# Patient Record
Sex: Male | Born: 1950
Health system: Southern US, Community
[De-identification: ages and names within clinical notes are randomized; demographics above are authoritative.]

## PROBLEM LIST (undated history)

## (undated) DIAGNOSIS — F419 Anxiety disorder, unspecified: Secondary | ICD-10-CM

## (undated) DIAGNOSIS — Z9889 Other specified postprocedural states: Secondary | ICD-10-CM

## (undated) DIAGNOSIS — G20A1 Parkinson's disease without dyskinesia, without mention of fluctuations: Secondary | ICD-10-CM

## (undated) DIAGNOSIS — J301 Allergic rhinitis due to pollen: Secondary | ICD-10-CM

## (undated) DIAGNOSIS — G20 Parkinson's disease: Secondary | ICD-10-CM

## (undated) DIAGNOSIS — N200 Calculus of kidney: Secondary | ICD-10-CM

## (undated) DIAGNOSIS — E890 Postprocedural hypothyroidism: Secondary | ICD-10-CM

## (undated) DIAGNOSIS — T7840XA Allergy, unspecified, initial encounter: Secondary | ICD-10-CM

## (undated) DIAGNOSIS — Z87442 Personal history of urinary calculi: Secondary | ICD-10-CM

## (undated) DIAGNOSIS — K219 Gastro-esophageal reflux disease without esophagitis: Secondary | ICD-10-CM

## (undated) DIAGNOSIS — M1990 Unspecified osteoarthritis, unspecified site: Secondary | ICD-10-CM

## (undated) HISTORY — DX: Gastro-esophageal reflux disease without esophagitis: K21.9

## (undated) HISTORY — DX: Calculus of kidney: N20.0

## (undated) HISTORY — DX: Allergic rhinitis due to pollen: J30.1

## (undated) HISTORY — PX: COLONOSCOPY: SHX174

## (undated) HISTORY — DX: Allergy, unspecified, initial encounter: T78.40XA

## (undated) HISTORY — DX: Postprocedural hypothyroidism: E89.0

---

## 2011-04-25 HISTORY — PX: THYROIDECTOMY: SHX17

## 2016-01-12 ENCOUNTER — Encounter: Payer: Self-pay | Admitting: Urology

## 2016-01-12 ENCOUNTER — Ambulatory Visit: Payer: Self-pay | Admitting: Urology

## 2016-01-12 NOTE — Progress Notes (Deleted)
   01/12/2016 1:46 PM   Todd Green [birth year] [id number]  Referring provider: No referring provider defined for this encounter.  No chief complaint on file.   HPI: 65 year old male referred from [facility] for evaluation of elevated PSA. His PSA was 4.1 proximally 1 year ago. Most recently, his repeat PSA was 6.0 on 11/10/15.     PMH: No past medical history on file.  Surgical History: No past surgical history on file.  Home Medications:    Medication List    as of 01/12/2016  1:46 PM   You have not been prescribed any medications.     Allergies: Allergies not on file  Family History: No family history on file.  Social History:  has no tobacco, alcohol, and drug history on file.  ROS:                                        Physical Exam: There were no vitals taken for this visit.  Constitutional:  Alert and oriented, No acute distress. HEENT: [place] AT, moist mucus membranes.  Trachea midline, no masses. Cardiovascular: No clubbing, cyanosis, or edema. Respiratory: Normal respiratory effort, no increased work of breathing. GI: Abdomen is soft, nontender, nondistended, no abdominal masses GU: No CVA tenderness. *** Skin: No rashes, bruises or suspicious lesions. Lymph: No cervical or inguinal adenopathy. Neurologic: Grossly intact, no focal deficits, moving all 4 extremities. Psychiatric: Normal mood and affect.  Laboratory Data: Cr 0.91 on 10/2015  Urinalysis No results found for: COLORURINE, APPEARANCEUR, LABSPEC, PHURINE, GLUCOSEU, HGBUR, BILIRUBINUR, KETONESUR, PROTEINUR, UROBILINOGEN, NITRITE, LEUKOCYTESUR  Pertinent Imaging: ***  Assessment & Plan:    There are no diagnoses linked to this encounter.  No Follow-up on file.  Hollice Espy, MD  [facility] [address] [place], [place] [address] [phone]

## 2016-01-20 DIAGNOSIS — H905 Unspecified sensorineural hearing loss: Secondary | ICD-10-CM | POA: Diagnosis not present

## 2016-04-24 HISTORY — PX: OTHER SURGICAL HISTORY: SHX169

## 2016-04-24 HISTORY — PX: HAND SURGERY: SHX662

## 2017-02-02 DIAGNOSIS — E039 Hypothyroidism, unspecified: Secondary | ICD-10-CM | POA: Insufficient documentation

## 2017-02-02 DIAGNOSIS — K219 Gastro-esophageal reflux disease without esophagitis: Secondary | ICD-10-CM | POA: Insufficient documentation

## 2017-02-03 DIAGNOSIS — M40202 Unspecified kyphosis, cervical region: Secondary | ICD-10-CM | POA: Insufficient documentation

## 2017-07-23 ENCOUNTER — Emergency Department
Admission: EM | Admit: 2017-07-23 | Discharge: 2017-07-24 | Disposition: A | Discharge status: Home / Self Care | Payer: Medicare Other | Attending: Emergency Medicine | Admitting: Emergency Medicine

## 2017-07-23 ENCOUNTER — Other Ambulatory Visit: Payer: Self-pay

## 2017-07-23 ENCOUNTER — Encounter: Payer: Self-pay | Admitting: Radiology

## 2017-07-23 ENCOUNTER — Emergency Department: Payer: Medicare Other

## 2017-07-23 DIAGNOSIS — R1031 Right lower quadrant pain: Secondary | ICD-10-CM | POA: Diagnosis present

## 2017-07-23 DIAGNOSIS — N200 Calculus of kidney: Secondary | ICD-10-CM | POA: Insufficient documentation

## 2017-07-23 LAB — URINALYSIS, COMPLETE (UACMP) WITH MICROSCOPIC
BACTERIA UA: NONE SEEN
Bilirubin Urine: NEGATIVE
Glucose, UA: NEGATIVE mg/dL
Hgb urine dipstick: NEGATIVE
Ketones, ur: NEGATIVE mg/dL
Leukocytes, UA: NEGATIVE
Nitrite: NEGATIVE
PH: 5 (ref 5.0–8.0)
Protein, ur: NEGATIVE mg/dL
SPECIFIC GRAVITY, URINE: 1.02 (ref 1.005–1.030)

## 2017-07-23 LAB — COMPREHENSIVE METABOLIC PANEL
ALBUMIN: 4 g/dL (ref 3.5–5.0)
ALT: 18 U/L (ref 17–63)
AST: 20 U/L (ref 15–41)
Alkaline Phosphatase: 99 U/L (ref 38–126)
Anion gap: 7 (ref 5–15)
BUN: 29 mg/dL — AB (ref 6–20)
CO2: 25 mmol/L (ref 22–32)
CREATININE: 1.1 mg/dL (ref 0.61–1.24)
Calcium: 9 mg/dL (ref 8.9–10.3)
Chloride: 108 mmol/L (ref 101–111)
GFR calc Af Amer: >60 mL/min (ref >60)
GFR calc non Af Amer: >60 mL/min (ref >60)
GLUCOSE: 144 mg/dL — AB (ref 65–99)
Potassium: 3.9 mmol/L (ref 3.5–5.1)
SODIUM: 140 mmol/L (ref 135–145)
Total Bilirubin: 0.7 mg/dL (ref 0.3–1.2)
Total Protein: 8 g/dL (ref 6.5–8.1)

## 2017-07-23 LAB — CBC
HEMATOCRIT: 42.7 % (ref 40.0–52.0)
Hemoglobin: 13.9 g/dL (ref 13.0–18.0)
MCH: 30.2 pg (ref 26.0–34.0)
MCHC: 32.5 g/dL (ref 32.0–36.0)
MCV: 92.9 fL (ref 80.0–100.0)
PLATELETS: 356 10*3/uL (ref 150–440)
RBC: 4.6 MIL/uL (ref 4.40–5.90)
RDW: 15.4 % — AB (ref 11.5–14.5)
WBC: 13.1 10*3/uL — ABNORMAL HIGH (ref 3.8–10.6)

## 2017-07-23 LAB — LIPASE, BLOOD: LIPASE: 27 U/L (ref 11–51)

## 2017-07-23 MED ORDER — IBUPROFEN 400 MG PO TABS
400.0000 mg | ORAL_TABLET | Freq: Once | ORAL | Status: AC | PRN
  Administered 2017-07-23: 400 mg via ORAL
  Filled 2017-07-23: qty 1

## 2017-07-23 MED ORDER — ONDANSETRON 4 MG PO TBDP
4.0000 mg | ORAL_TABLET | Freq: Once | ORAL | Status: AC
  Administered 2017-07-23: 4 mg via ORAL
  Filled 2017-07-23: qty 1

## 2017-07-23 MED ORDER — IBUPROFEN 400 MG PO TABS
400.0000 mg | ORAL_TABLET | ORAL | Status: AC
  Administered 2017-07-23: 400 mg via ORAL
  Filled 2017-07-23: qty 1

## 2017-07-23 MED ORDER — IOPAMIDOL (ISOVUE-300) INJECTION 61%
100.0000 mL | Freq: Once | INTRAVENOUS | Status: AC | PRN
  Administered 2017-07-23: 100 mL via INTRAVENOUS

## 2017-07-23 MED ORDER — ONDANSETRON 4 MG PO TBDP: 4.0000 mg | ORAL_TABLET | Freq: Four times a day (QID) | ORAL | 0 refills | Status: DC | PRN

## 2017-07-23 NOTE — Discharge Instructions (Addendum)
You have been seen in the Emergency Department (ED) today for pain that we believe based on your workup, is caused by kidney stones.  As we have discussed, please drink plenty of fluids.  Please make a follow up appointment with the physician(s) listed elsewhere in this documentation.   We also recommend that you take over-the-counter ibuprofen regularly according to label instructions over the next 5 days.  Take it with meals to minimize stomach discomfort.  Return to the Emergency Department (ED) or call your doctor if you have any worsening pain, fever, painful urination, are unable to urinate, or develop other symptoms that concern you.

## 2017-07-23 NOTE — ED Notes (Signed)
CT called and notified pt has IV

## 2017-07-23 NOTE — ED Triage Notes (Signed)
Pt brought in via ems from home with abd pain and right lower back pain.  Pt states vomiting x2 .  Hx kidney stones.  Pt alert.
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
## 2017-09-26 ENCOUNTER — Ambulatory Visit: Payer: Self-pay | Admitting: Family Medicine

## 2017-09-28 ENCOUNTER — Ambulatory Visit: Payer: Self-pay | Admitting: Family Medicine

## 2017-10-01 ENCOUNTER — Ambulatory Visit: Payer: Self-pay | Admitting: Family Medicine

## 2017-10-02 ENCOUNTER — Emergency Department: Payer: Medicare Other

## 2017-10-02 ENCOUNTER — Encounter: Payer: Self-pay | Admitting: Emergency Medicine

## 2017-10-02 ENCOUNTER — Emergency Department
Admission: EM | Admit: 2017-10-02 | Discharge: 2017-10-02 | Disposition: A | Discharge status: Other Acute Inpatient Hospital | Payer: Medicare Other | Attending: Emergency Medicine | Admitting: Emergency Medicine

## 2017-10-02 ENCOUNTER — Other Ambulatory Visit: Payer: Self-pay

## 2017-10-02 DIAGNOSIS — S299XXA Unspecified injury of thorax, initial encounter: Secondary | ICD-10-CM | POA: Diagnosis not present

## 2017-10-02 DIAGNOSIS — S02401A Maxillary fracture, unspecified, initial encounter for closed fracture: Secondary | ICD-10-CM | POA: Diagnosis not present

## 2017-10-02 DIAGNOSIS — S0219XA Other fracture of base of skull, initial encounter for closed fracture: Secondary | ICD-10-CM

## 2017-10-02 DIAGNOSIS — S199XXA Unspecified injury of neck, initial encounter: Secondary | ICD-10-CM | POA: Diagnosis not present

## 2017-10-02 DIAGNOSIS — M1712 Unilateral primary osteoarthritis, left knee: Secondary | ICD-10-CM | POA: Diagnosis not present

## 2017-10-02 DIAGNOSIS — M5136 Other intervertebral disc degeneration, lumbar region: Secondary | ICD-10-CM | POA: Diagnosis not present

## 2017-10-02 DIAGNOSIS — M542 Cervicalgia: Secondary | ICD-10-CM | POA: Diagnosis not present

## 2017-10-02 DIAGNOSIS — Z882 Allergy status to sulfonamides status: Secondary | ICD-10-CM | POA: Diagnosis not present

## 2017-10-02 DIAGNOSIS — S0240DA Maxillary fracture, left side, initial encounter for closed fracture: Secondary | ICD-10-CM

## 2017-10-02 DIAGNOSIS — Y998 Other external cause status: Secondary | ICD-10-CM | POA: Diagnosis not present

## 2017-10-02 DIAGNOSIS — S8992XA Unspecified injury of left lower leg, initial encounter: Secondary | ICD-10-CM | POA: Diagnosis not present

## 2017-10-02 DIAGNOSIS — Y92481 Parking lot as the place of occurrence of the external cause: Secondary | ICD-10-CM | POA: Diagnosis not present

## 2017-10-02 DIAGNOSIS — G319 Degenerative disease of nervous system, unspecified: Secondary | ICD-10-CM | POA: Diagnosis not present

## 2017-10-02 DIAGNOSIS — Y9389 Activity, other specified: Secondary | ICD-10-CM | POA: Diagnosis not present

## 2017-10-02 DIAGNOSIS — S3993XA Unspecified injury of pelvis, initial encounter: Secondary | ICD-10-CM | POA: Diagnosis not present

## 2017-10-02 DIAGNOSIS — S0282XA Fracture of other specified skull and facial bones, left side, initial encounter for closed fracture: Secondary | ICD-10-CM | POA: Diagnosis not present

## 2017-10-02 DIAGNOSIS — R110 Nausea: Secondary | ICD-10-CM | POA: Diagnosis not present

## 2017-10-02 DIAGNOSIS — W01198A Fall on same level from slipping, tripping and stumbling with subsequent striking against other object, initial encounter: Secondary | ICD-10-CM | POA: Insufficient documentation

## 2017-10-02 DIAGNOSIS — R9431 Abnormal electrocardiogram [ECG] [EKG]: Secondary | ICD-10-CM | POA: Diagnosis not present

## 2017-10-02 DIAGNOSIS — I4510 Unspecified right bundle-branch block: Secondary | ICD-10-CM | POA: Diagnosis not present

## 2017-10-02 DIAGNOSIS — W19XXXA Unspecified fall, initial encounter: Secondary | ICD-10-CM | POA: Diagnosis not present

## 2017-10-02 DIAGNOSIS — S3992XA Unspecified injury of lower back, initial encounter: Secondary | ICD-10-CM | POA: Diagnosis not present

## 2017-10-02 DIAGNOSIS — G9389 Other specified disorders of brain: Secondary | ICD-10-CM | POA: Diagnosis not present

## 2017-10-02 DIAGNOSIS — M5031 Other cervical disc degeneration,  high cervical region: Secondary | ICD-10-CM | POA: Diagnosis not present

## 2017-10-02 DIAGNOSIS — S0990XA Unspecified injury of head, initial encounter: Secondary | ICD-10-CM | POA: Diagnosis not present

## 2017-10-02 DIAGNOSIS — S0181XA Laceration without foreign body of other part of head, initial encounter: Secondary | ICD-10-CM | POA: Insufficient documentation

## 2017-10-02 DIAGNOSIS — R402411 Glasgow coma scale score 13-15, in the field [EMT or ambulance]: Secondary | ICD-10-CM | POA: Diagnosis not present

## 2017-10-02 DIAGNOSIS — S060X9A Concussion with loss of consciousness of unspecified duration, initial encounter: Secondary | ICD-10-CM | POA: Diagnosis not present

## 2017-10-02 DIAGNOSIS — M5134 Other intervertebral disc degeneration, thoracic region: Secondary | ICD-10-CM | POA: Diagnosis not present

## 2017-10-02 DIAGNOSIS — S020XXA Fracture of vault of skull, initial encounter for closed fracture: Secondary | ICD-10-CM | POA: Diagnosis not present

## 2017-10-02 DIAGNOSIS — K219 Gastro-esophageal reflux disease without esophagitis: Secondary | ICD-10-CM | POA: Diagnosis not present

## 2017-10-02 DIAGNOSIS — Z981 Arthrodesis status: Secondary | ICD-10-CM | POA: Diagnosis not present

## 2017-10-02 DIAGNOSIS — S0292XA Unspecified fracture of facial bones, initial encounter for closed fracture: Secondary | ICD-10-CM | POA: Diagnosis not present

## 2017-10-02 DIAGNOSIS — E039 Hypothyroidism, unspecified: Secondary | ICD-10-CM | POA: Diagnosis not present

## 2017-10-02 DIAGNOSIS — S0232XA Fracture of orbital floor, left side, initial encounter for closed fracture: Secondary | ICD-10-CM | POA: Diagnosis not present

## 2017-10-02 DIAGNOSIS — R000 Tachycardia, unspecified: Secondary | ICD-10-CM | POA: Diagnosis not present

## 2017-10-02 DIAGNOSIS — S0993XA Unspecified injury of face, initial encounter: Secondary | ICD-10-CM | POA: Diagnosis not present

## 2017-10-02 DIAGNOSIS — S3991XA Unspecified injury of abdomen, initial encounter: Secondary | ICD-10-CM | POA: Diagnosis not present

## 2017-10-02 HISTORY — DX: Other specified postprocedural states: Z98.890

## 2017-10-02 LAB — BASIC METABOLIC PANEL
Anion gap: 8 (ref 5–15)
BUN: 25 mg/dL — AB (ref 6–20)
CHLORIDE: 108 mmol/L (ref 101–111)
CO2: 23 mmol/L (ref 22–32)
CREATININE: 1.03 mg/dL (ref 0.61–1.24)
Calcium: 9 mg/dL (ref 8.9–10.3)
GFR calc Af Amer: >60 mL/min (ref >60)
GFR calc non Af Amer: >60 mL/min (ref >60)
GLUCOSE: 147 mg/dL — AB (ref 65–99)
POTASSIUM: 3.5 mmol/L (ref 3.5–5.1)
SODIUM: 139 mmol/L (ref 135–145)

## 2017-10-02 LAB — CBC
HEMATOCRIT: 36.4 % — AB (ref 40.0–52.0)
Hemoglobin: 12.7 g/dL — ABNORMAL LOW (ref 13.0–18.0)
MCH: 32.2 pg (ref 26.0–34.0)
MCHC: 34.9 g/dL (ref 32.0–36.0)
MCV: 92.3 fL (ref 80.0–100.0)
PLATELETS: 329 10*3/uL (ref 150–440)
RBC: 3.95 MIL/uL — ABNORMAL LOW (ref 4.40–5.90)
RDW: 14 % (ref 11.5–14.5)
WBC: 9.1 10*3/uL (ref 3.8–10.6)

## 2017-10-02 LAB — TROPONIN I: Troponin I: <0.03 ng/mL (ref <0.03)

## 2017-10-02 MED ORDER — OXYCODONE-ACETAMINOPHEN 5-325 MG PO TABS
1.0000 | ORAL_TABLET | Freq: Once | ORAL | Status: AC
  Administered 2017-10-02: 1 via ORAL
  Filled 2017-10-02: qty 1

## 2017-10-02 MED ORDER — ONDANSETRON HCL 4 MG/2ML IJ SOLN
INTRAMUSCULAR | Status: AC
  Administered 2017-10-02: 4 mg via INTRAVENOUS
  Filled 2017-10-02: qty 2

## 2017-10-02 MED ORDER — ONDANSETRON HCL 4 MG/2ML IJ SOLN
4.0000 mg | Freq: Once | INTRAMUSCULAR | Status: AC
  Administered 2017-10-02: 4 mg via INTRAVENOUS
  Filled 2017-10-02: qty 2

## 2017-10-02 MED ORDER — SODIUM CHLORIDE 0.9 % IV SOLN
3.0000 g | Freq: Once | INTRAVENOUS | Status: AC
  Administered 2017-10-02: 3 g via INTRAVENOUS
  Filled 2017-10-02: qty 3

## 2017-10-02 MED ORDER — METOCLOPRAMIDE HCL 5 MG/ML IJ SOLN
10.0000 mg | Freq: Once | INTRAMUSCULAR | Status: AC
Start: 2017-10-02 — End: 2017-10-02
  Administered 2017-10-02: 10 mg via INTRAVENOUS
  Filled 2017-10-02: qty 2

## 2017-10-02 MED ORDER — ONDANSETRON HCL 4 MG/2ML IJ SOLN
4.0000 mg | Freq: Once | INTRAMUSCULAR | Status: AC
  Administered 2017-10-02: 4 mg via INTRAVENOUS
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
## 2017-10-02 NOTE — ED Notes (Signed)
Pt transported to CT ?

## 2017-10-02 NOTE — ED Notes (Signed)
Daughter said to call her once her dad is being transferred phone number is [phone]

## 2017-10-02 NOTE — ED Notes (Signed)
[facility] not working. Pt signed paper consent for transfer.

## 2017-10-02 NOTE — ED Notes (Signed)
Pt updated and given warm blanket

## 2017-10-03 ENCOUNTER — Ambulatory Visit: Payer: Self-pay | Admitting: Internal Medicine

## 2017-10-03 DIAGNOSIS — S0219XA Other fracture of base of skull, initial encounter for closed fracture: Secondary | ICD-10-CM | POA: Diagnosis not present

## 2017-10-03 DIAGNOSIS — G9389 Other specified disorders of brain: Secondary | ICD-10-CM | POA: Diagnosis not present

## 2017-10-03 DIAGNOSIS — E039 Hypothyroidism, unspecified: Secondary | ICD-10-CM | POA: Diagnosis not present

## 2017-10-03 DIAGNOSIS — Z981 Arthrodesis status: Secondary | ICD-10-CM | POA: Diagnosis not present

## 2017-10-04 DIAGNOSIS — Z981 Arthrodesis status: Secondary | ICD-10-CM | POA: Diagnosis not present

## 2017-10-04 DIAGNOSIS — S0219XA Other fracture of base of skull, initial encounter for closed fracture: Secondary | ICD-10-CM | POA: Diagnosis not present

## 2017-10-04 DIAGNOSIS — G9389 Other specified disorders of brain: Secondary | ICD-10-CM | POA: Diagnosis not present

## 2017-10-04 DIAGNOSIS — E039 Hypothyroidism, unspecified: Secondary | ICD-10-CM | POA: Diagnosis not present

## 2017-10-06 DIAGNOSIS — S069X9D Unspecified intracranial injury with loss of consciousness of unspecified duration, subsequent encounter: Secondary | ICD-10-CM | POA: Diagnosis not present

## 2017-10-06 DIAGNOSIS — S0240DD Maxillary fracture, left side, subsequent encounter for fracture with routine healing: Secondary | ICD-10-CM | POA: Diagnosis not present

## 2017-10-06 DIAGNOSIS — K219 Gastro-esophageal reflux disease without esophagitis: Secondary | ICD-10-CM | POA: Diagnosis not present

## 2017-10-06 DIAGNOSIS — S0219XD Other fracture of base of skull, subsequent encounter for fracture with routine healing: Secondary | ICD-10-CM | POA: Diagnosis not present

## 2017-10-06 DIAGNOSIS — J309 Allergic rhinitis, unspecified: Secondary | ICD-10-CM | POA: Diagnosis not present

## 2017-10-06 DIAGNOSIS — Z87442 Personal history of urinary calculi: Secondary | ICD-10-CM | POA: Diagnosis not present

## 2017-10-06 DIAGNOSIS — K7689 Other specified diseases of liver: Secondary | ICD-10-CM | POA: Diagnosis not present

## 2017-10-06 DIAGNOSIS — S0232XD Fracture of orbital floor, left side, subsequent encounter for fracture with routine healing: Secondary | ICD-10-CM | POA: Diagnosis not present

## 2017-10-06 DIAGNOSIS — Z981 Arthrodesis status: Secondary | ICD-10-CM | POA: Diagnosis not present

## 2017-10-06 DIAGNOSIS — S0081XD Abrasion of other part of head, subsequent encounter: Secondary | ICD-10-CM | POA: Diagnosis not present

## 2017-10-06 DIAGNOSIS — M1712 Unilateral primary osteoarthritis, left knee: Secondary | ICD-10-CM | POA: Diagnosis not present

## 2017-10-06 DIAGNOSIS — M50321 Other cervical disc degeneration at C4-C5 level: Secondary | ICD-10-CM | POA: Diagnosis not present

## 2017-10-06 DIAGNOSIS — K449 Diaphragmatic hernia without obstruction or gangrene: Secondary | ICD-10-CM | POA: Diagnosis not present

## 2017-10-06 DIAGNOSIS — M5136 Other intervertebral disc degeneration, lumbar region: Secondary | ICD-10-CM | POA: Diagnosis not present

## 2017-10-06 DIAGNOSIS — S0083XD Contusion of other part of head, subsequent encounter: Secondary | ICD-10-CM | POA: Diagnosis not present

## 2017-10-06 DIAGNOSIS — W0110XD Fall on same level from slipping, tripping and stumbling with subsequent striking against unspecified object, subsequent encounter: Secondary | ICD-10-CM | POA: Diagnosis not present

## 2017-10-06 DIAGNOSIS — S00212D Abrasion of left eyelid and periocular area, subsequent encounter: Secondary | ICD-10-CM | POA: Diagnosis not present

## 2017-10-06 DIAGNOSIS — Z9181 History of falling: Secondary | ICD-10-CM | POA: Diagnosis not present

## 2017-10-06 DIAGNOSIS — S0181XD Laceration without foreign body of other part of head, subsequent encounter: Secondary | ICD-10-CM | POA: Diagnosis not present

## 2017-10-06 DIAGNOSIS — G319 Degenerative disease of nervous system, unspecified: Secondary | ICD-10-CM | POA: Diagnosis not present

## 2017-10-06 DIAGNOSIS — E890 Postprocedural hypothyroidism: Secondary | ICD-10-CM | POA: Diagnosis not present

## 2017-10-06 DIAGNOSIS — N281 Cyst of kidney, acquired: Secondary | ICD-10-CM | POA: Diagnosis not present

## 2017-10-06 DIAGNOSIS — M40202 Unspecified kyphosis, cervical region: Secondary | ICD-10-CM | POA: Diagnosis not present

## 2017-10-06 DIAGNOSIS — S40212D Abrasion of left shoulder, subsequent encounter: Secondary | ICD-10-CM | POA: Diagnosis not present

## 2017-10-08 DIAGNOSIS — S0232XD Fracture of orbital floor, left side, subsequent encounter for fracture with routine healing: Secondary | ICD-10-CM | POA: Diagnosis not present

## 2017-10-08 DIAGNOSIS — N281 Cyst of kidney, acquired: Secondary | ICD-10-CM | POA: Diagnosis not present

## 2017-10-08 DIAGNOSIS — S0240DD Maxillary fracture, left side, subsequent encounter for fracture with routine healing: Secondary | ICD-10-CM | POA: Diagnosis not present

## 2017-10-08 DIAGNOSIS — S40212D Abrasion of left shoulder, subsequent encounter: Secondary | ICD-10-CM | POA: Diagnosis not present

## 2017-10-08 DIAGNOSIS — S0181XD Laceration without foreign body of other part of head, subsequent encounter: Secondary | ICD-10-CM | POA: Diagnosis not present

## 2017-10-08 DIAGNOSIS — Z87442 Personal history of urinary calculi: Secondary | ICD-10-CM | POA: Diagnosis not present

## 2017-10-08 DIAGNOSIS — K7689 Other specified diseases of liver: Secondary | ICD-10-CM | POA: Diagnosis not present

## 2017-10-08 DIAGNOSIS — E890 Postprocedural hypothyroidism: Secondary | ICD-10-CM | POA: Diagnosis not present

## 2017-10-08 DIAGNOSIS — S0083XD Contusion of other part of head, subsequent encounter: Secondary | ICD-10-CM | POA: Diagnosis not present

## 2017-10-08 DIAGNOSIS — K219 Gastro-esophageal reflux disease without esophagitis: Secondary | ICD-10-CM | POA: Diagnosis not present

## 2017-10-08 DIAGNOSIS — M5136 Other intervertebral disc degeneration, lumbar region: Secondary | ICD-10-CM | POA: Diagnosis not present

## 2017-10-08 DIAGNOSIS — Z9181 History of falling: Secondary | ICD-10-CM | POA: Diagnosis not present

## 2017-10-08 DIAGNOSIS — G319 Degenerative disease of nervous system, unspecified: Secondary | ICD-10-CM | POA: Diagnosis not present

## 2017-10-08 DIAGNOSIS — S0081XD Abrasion of other part of head, subsequent encounter: Secondary | ICD-10-CM | POA: Diagnosis not present

## 2017-10-08 DIAGNOSIS — J309 Allergic rhinitis, unspecified: Secondary | ICD-10-CM | POA: Diagnosis not present

## 2017-10-08 DIAGNOSIS — K449 Diaphragmatic hernia without obstruction or gangrene: Secondary | ICD-10-CM | POA: Diagnosis not present

## 2017-10-08 DIAGNOSIS — W0110XD Fall on same level from slipping, tripping and stumbling with subsequent striking against unspecified object, subsequent encounter: Secondary | ICD-10-CM | POA: Diagnosis not present

## 2017-10-08 DIAGNOSIS — S00212D Abrasion of left eyelid and periocular area, subsequent encounter: Secondary | ICD-10-CM | POA: Diagnosis not present

## 2017-10-08 DIAGNOSIS — Z981 Arthrodesis status: Secondary | ICD-10-CM | POA: Diagnosis not present

## 2017-10-08 DIAGNOSIS — M1712 Unilateral primary osteoarthritis, left knee: Secondary | ICD-10-CM | POA: Diagnosis not present

## 2017-10-08 DIAGNOSIS — S0219XD Other fracture of base of skull, subsequent encounter for fracture with routine healing: Secondary | ICD-10-CM | POA: Diagnosis not present

## 2017-10-08 DIAGNOSIS — M50321 Other cervical disc degeneration at C4-C5 level: Secondary | ICD-10-CM | POA: Diagnosis not present

## 2017-10-08 DIAGNOSIS — M40202 Unspecified kyphosis, cervical region: Secondary | ICD-10-CM | POA: Diagnosis not present

## 2017-10-08 DIAGNOSIS — S069X9D Unspecified intracranial injury with loss of consciousness of unspecified duration, subsequent encounter: Secondary | ICD-10-CM | POA: Diagnosis not present

## 2017-10-10 DIAGNOSIS — M5136 Other intervertebral disc degeneration, lumbar region: Secondary | ICD-10-CM | POA: Diagnosis not present

## 2017-10-10 DIAGNOSIS — S0240DD Maxillary fracture, left side, subsequent encounter for fracture with routine healing: Secondary | ICD-10-CM | POA: Diagnosis not present

## 2017-10-10 DIAGNOSIS — S0083XD Contusion of other part of head, subsequent encounter: Secondary | ICD-10-CM | POA: Diagnosis not present

## 2017-10-10 DIAGNOSIS — Z87442 Personal history of urinary calculi: Secondary | ICD-10-CM | POA: Diagnosis not present

## 2017-10-10 DIAGNOSIS — M1712 Unilateral primary osteoarthritis, left knee: Secondary | ICD-10-CM | POA: Diagnosis not present

## 2017-10-10 DIAGNOSIS — M40202 Unspecified kyphosis, cervical region: Secondary | ICD-10-CM | POA: Diagnosis not present

## 2017-10-10 DIAGNOSIS — G319 Degenerative disease of nervous system, unspecified: Secondary | ICD-10-CM | POA: Diagnosis not present

## 2017-10-10 DIAGNOSIS — S0219XD Other fracture of base of skull, subsequent encounter for fracture with routine healing: Secondary | ICD-10-CM | POA: Diagnosis not present

## 2017-10-10 DIAGNOSIS — S0081XD Abrasion of other part of head, subsequent encounter: Secondary | ICD-10-CM | POA: Diagnosis not present

## 2017-10-10 DIAGNOSIS — J309 Allergic rhinitis, unspecified: Secondary | ICD-10-CM | POA: Diagnosis not present

## 2017-10-10 DIAGNOSIS — M50321 Other cervical disc degeneration at C4-C5 level: Secondary | ICD-10-CM | POA: Diagnosis not present

## 2017-10-10 DIAGNOSIS — K219 Gastro-esophageal reflux disease without esophagitis: Secondary | ICD-10-CM | POA: Diagnosis not present

## 2017-10-10 DIAGNOSIS — N281 Cyst of kidney, acquired: Secondary | ICD-10-CM | POA: Diagnosis not present

## 2017-10-10 DIAGNOSIS — K449 Diaphragmatic hernia without obstruction or gangrene: Secondary | ICD-10-CM | POA: Diagnosis not present

## 2017-10-10 DIAGNOSIS — S069X9D Unspecified intracranial injury with loss of consciousness of unspecified duration, subsequent encounter: Secondary | ICD-10-CM | POA: Diagnosis not present

## 2017-10-10 DIAGNOSIS — S40212D Abrasion of left shoulder, subsequent encounter: Secondary | ICD-10-CM | POA: Diagnosis not present

## 2017-10-10 DIAGNOSIS — W0110XD Fall on same level from slipping, tripping and stumbling with subsequent striking against unspecified object, subsequent encounter: Secondary | ICD-10-CM | POA: Diagnosis not present

## 2017-10-10 DIAGNOSIS — S0232XD Fracture of orbital floor, left side, subsequent encounter for fracture with routine healing: Secondary | ICD-10-CM | POA: Diagnosis not present

## 2017-10-10 DIAGNOSIS — S0181XD Laceration without foreign body of other part of head, subsequent encounter: Secondary | ICD-10-CM | POA: Diagnosis not present

## 2017-10-10 DIAGNOSIS — Z9181 History of falling: Secondary | ICD-10-CM | POA: Diagnosis not present

## 2017-10-10 DIAGNOSIS — K7689 Other specified diseases of liver: Secondary | ICD-10-CM | POA: Diagnosis not present

## 2017-10-10 DIAGNOSIS — Z981 Arthrodesis status: Secondary | ICD-10-CM | POA: Diagnosis not present

## 2017-10-10 DIAGNOSIS — E890 Postprocedural hypothyroidism: Secondary | ICD-10-CM | POA: Diagnosis not present

## 2017-10-10 DIAGNOSIS — S00212D Abrasion of left eyelid and periocular area, subsequent encounter: Secondary | ICD-10-CM | POA: Diagnosis not present

## 2017-10-11 DIAGNOSIS — S069X9D Unspecified intracranial injury with loss of consciousness of unspecified duration, subsequent encounter: Secondary | ICD-10-CM | POA: Diagnosis not present

## 2017-10-11 DIAGNOSIS — Z981 Arthrodesis status: Secondary | ICD-10-CM | POA: Diagnosis not present

## 2017-10-11 DIAGNOSIS — K219 Gastro-esophageal reflux disease without esophagitis: Secondary | ICD-10-CM | POA: Diagnosis not present

## 2017-10-11 DIAGNOSIS — N281 Cyst of kidney, acquired: Secondary | ICD-10-CM | POA: Diagnosis not present

## 2017-10-11 DIAGNOSIS — J309 Allergic rhinitis, unspecified: Secondary | ICD-10-CM | POA: Diagnosis not present

## 2017-10-11 DIAGNOSIS — M1712 Unilateral primary osteoarthritis, left knee: Secondary | ICD-10-CM | POA: Diagnosis not present

## 2017-10-11 DIAGNOSIS — W0110XD Fall on same level from slipping, tripping and stumbling with subsequent striking against unspecified object, subsequent encounter: Secondary | ICD-10-CM | POA: Diagnosis not present

## 2017-10-11 DIAGNOSIS — Z9181 History of falling: Secondary | ICD-10-CM | POA: Diagnosis not present

## 2017-10-11 DIAGNOSIS — S0181XD Laceration without foreign body of other part of head, subsequent encounter: Secondary | ICD-10-CM | POA: Diagnosis not present

## 2017-10-11 DIAGNOSIS — E890 Postprocedural hypothyroidism: Secondary | ICD-10-CM | POA: Diagnosis not present

## 2017-10-11 DIAGNOSIS — Z87442 Personal history of urinary calculi: Secondary | ICD-10-CM | POA: Diagnosis not present

## 2017-10-11 DIAGNOSIS — K449 Diaphragmatic hernia without obstruction or gangrene: Secondary | ICD-10-CM | POA: Diagnosis not present

## 2017-10-11 DIAGNOSIS — M5136 Other intervertebral disc degeneration, lumbar region: Secondary | ICD-10-CM | POA: Diagnosis not present

## 2017-10-11 DIAGNOSIS — S40212D Abrasion of left shoulder, subsequent encounter: Secondary | ICD-10-CM | POA: Diagnosis not present

## 2017-10-11 DIAGNOSIS — K7689 Other specified diseases of liver: Secondary | ICD-10-CM | POA: Diagnosis not present

## 2017-10-11 DIAGNOSIS — S0081XD Abrasion of other part of head, subsequent encounter: Secondary | ICD-10-CM | POA: Diagnosis not present

## 2017-10-11 DIAGNOSIS — S00212D Abrasion of left eyelid and periocular area, subsequent encounter: Secondary | ICD-10-CM | POA: Diagnosis not present

## 2017-10-11 DIAGNOSIS — S0219XD Other fracture of base of skull, subsequent encounter for fracture with routine healing: Secondary | ICD-10-CM | POA: Diagnosis not present

## 2017-10-11 DIAGNOSIS — S0240DD Maxillary fracture, left side, subsequent encounter for fracture with routine healing: Secondary | ICD-10-CM | POA: Diagnosis not present

## 2017-10-11 DIAGNOSIS — G319 Degenerative disease of nervous system, unspecified: Secondary | ICD-10-CM | POA: Diagnosis not present

## 2017-10-11 DIAGNOSIS — S0232XD Fracture of orbital floor, left side, subsequent encounter for fracture with routine healing: Secondary | ICD-10-CM | POA: Diagnosis not present

## 2017-10-11 DIAGNOSIS — S0083XD Contusion of other part of head, subsequent encounter: Secondary | ICD-10-CM | POA: Diagnosis not present

## 2017-10-11 DIAGNOSIS — M50321 Other cervical disc degeneration at C4-C5 level: Secondary | ICD-10-CM | POA: Diagnosis not present

## 2017-10-11 DIAGNOSIS — M40202 Unspecified kyphosis, cervical region: Secondary | ICD-10-CM | POA: Diagnosis not present

## 2017-10-30 DIAGNOSIS — S40212D Abrasion of left shoulder, subsequent encounter: Secondary | ICD-10-CM | POA: Diagnosis not present

## 2017-10-30 DIAGNOSIS — K219 Gastro-esophageal reflux disease without esophagitis: Secondary | ICD-10-CM | POA: Diagnosis not present

## 2017-10-30 DIAGNOSIS — N281 Cyst of kidney, acquired: Secondary | ICD-10-CM | POA: Diagnosis not present

## 2017-10-30 DIAGNOSIS — S0081XD Abrasion of other part of head, subsequent encounter: Secondary | ICD-10-CM | POA: Diagnosis not present

## 2017-10-30 DIAGNOSIS — S0083XD Contusion of other part of head, subsequent encounter: Secondary | ICD-10-CM | POA: Diagnosis not present

## 2017-10-30 DIAGNOSIS — M40202 Unspecified kyphosis, cervical region: Secondary | ICD-10-CM | POA: Diagnosis not present

## 2017-10-30 DIAGNOSIS — E890 Postprocedural hypothyroidism: Secondary | ICD-10-CM | POA: Diagnosis not present

## 2017-10-30 DIAGNOSIS — Z981 Arthrodesis status: Secondary | ICD-10-CM | POA: Diagnosis not present

## 2017-10-30 DIAGNOSIS — Z9181 History of falling: Secondary | ICD-10-CM | POA: Diagnosis not present

## 2017-10-30 DIAGNOSIS — K449 Diaphragmatic hernia without obstruction or gangrene: Secondary | ICD-10-CM | POA: Diagnosis not present

## 2017-10-30 DIAGNOSIS — J309 Allergic rhinitis, unspecified: Secondary | ICD-10-CM | POA: Diagnosis not present

## 2017-10-30 DIAGNOSIS — S0219XD Other fracture of base of skull, subsequent encounter for fracture with routine healing: Secondary | ICD-10-CM | POA: Diagnosis not present

## 2017-10-30 DIAGNOSIS — S069X9D Unspecified intracranial injury with loss of consciousness of unspecified duration, subsequent encounter: Secondary | ICD-10-CM | POA: Diagnosis not present

## 2017-10-30 DIAGNOSIS — S00212D Abrasion of left eyelid and periocular area, subsequent encounter: Secondary | ICD-10-CM | POA: Diagnosis not present

## 2017-10-30 DIAGNOSIS — S0232XD Fracture of orbital floor, left side, subsequent encounter for fracture with routine healing: Secondary | ICD-10-CM | POA: Diagnosis not present

## 2017-10-30 DIAGNOSIS — M1712 Unilateral primary osteoarthritis, left knee: Secondary | ICD-10-CM | POA: Diagnosis not present

## 2017-10-30 DIAGNOSIS — G319 Degenerative disease of nervous system, unspecified: Secondary | ICD-10-CM | POA: Diagnosis not present

## 2017-10-30 DIAGNOSIS — M50321 Other cervical disc degeneration at C4-C5 level: Secondary | ICD-10-CM | POA: Diagnosis not present

## 2017-10-30 DIAGNOSIS — W0110XD Fall on same level from slipping, tripping and stumbling with subsequent striking against unspecified object, subsequent encounter: Secondary | ICD-10-CM | POA: Diagnosis not present

## 2017-10-30 DIAGNOSIS — S0181XD Laceration without foreign body of other part of head, subsequent encounter: Secondary | ICD-10-CM | POA: Diagnosis not present

## 2017-10-30 DIAGNOSIS — K7689 Other specified diseases of liver: Secondary | ICD-10-CM | POA: Diagnosis not present

## 2017-10-30 DIAGNOSIS — M5136 Other intervertebral disc degeneration, lumbar region: Secondary | ICD-10-CM | POA: Diagnosis not present

## 2017-10-30 DIAGNOSIS — S0240DD Maxillary fracture, left side, subsequent encounter for fracture with routine healing: Secondary | ICD-10-CM | POA: Diagnosis not present

## 2017-10-30 DIAGNOSIS — Z87442 Personal history of urinary calculi: Secondary | ICD-10-CM | POA: Diagnosis not present

## 2017-10-31 ENCOUNTER — Encounter: Payer: Self-pay | Admitting: Internal Medicine

## 2017-10-31 ENCOUNTER — Ambulatory Visit (INDEPENDENT_AMBULATORY_CARE_PROVIDER_SITE_OTHER): Payer: Medicare Other | Admitting: Nurse Practitioner

## 2017-10-31 ENCOUNTER — Encounter (INDEPENDENT_AMBULATORY_CARE_PROVIDER_SITE_OTHER): Payer: Self-pay

## 2017-10-31 VITALS — BP 138/82 | HR 97 | Resp 16 | Ht 69.0 in | Wt 178.2 lb

## 2017-10-31 DIAGNOSIS — G250 Essential tremor: Secondary | ICD-10-CM | POA: Diagnosis not present

## 2017-10-31 DIAGNOSIS — M79642 Pain in left hand: Secondary | ICD-10-CM

## 2017-10-31 DIAGNOSIS — E890 Postprocedural hypothyroidism: Secondary | ICD-10-CM
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
## 2017-11-05 ENCOUNTER — Telehealth: Payer: Self-pay
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
## 2017-11-05 NOTE — Telephone Encounter (Signed)
Pt was notified.  

## 2017-11-12 ENCOUNTER — Telehealth: Payer: Self-pay

## 2017-11-12 NOTE — Telephone Encounter (Signed)
Patient advised that his paperwork is ready for pickup. Tat  Patient requested that I mail them to [employer] [address] [place], [place]

## 2017-11-14 ENCOUNTER — Telehealth: Payer: Self-pay

## 2017-11-14 NOTE — Telephone Encounter (Signed)
PT CALLED TO ASK WHEN HE IS ABLE TO DRIVE AGAIN. SPOKE [place] AND SHE TOLD ME TO ADVISE HIM THAT HE WAIT UNTIL HE COMES TO HIS NEXT APPT SO WE ARE PROPERLY ABLE TO DETERMINE HOW TO GO ABOUT THIS SITUATION.  PT WAS ADVISED.

## 2017-11-22 ENCOUNTER — Other Ambulatory Visit: Payer: Self-pay

## 2017-11-22 MED ORDER — OMEPRAZOLE 40 MG PO CPDR
DELAYED_RELEASE_CAPSULE | ORAL | 4 refills | Status: DC
Start: 2017-11-22 — End: 2018-03-11

## 2017-12-17 DIAGNOSIS — R6889 Other general symptoms and signs: Secondary | ICD-10-CM | POA: Diagnosis not present

## 2017-12-27 DIAGNOSIS — R6889 Other general symptoms and signs: Secondary | ICD-10-CM | POA: Diagnosis not present

## 2017-12-27 DIAGNOSIS — S62015K Nondisplaced fracture of distal pole of navicular [scaphoid] bone of left wrist, subsequent encounter for fracture with nonunion: Secondary | ICD-10-CM | POA: Diagnosis not present

## 2018-01-11 ENCOUNTER — Telehealth: Payer: Self-pay

## 2018-01-11 ENCOUNTER — Other Ambulatory Visit: Payer: Self-pay

## 2018-01-11 ENCOUNTER — Other Ambulatory Visit: Payer: Self-pay | Admitting: Nurse Practitioner

## 2018-01-11 DIAGNOSIS — F17200 Nicotine dependence, unspecified, uncomplicated: Secondary | ICD-10-CM

## 2018-01-11 MED ORDER — BUPROPION HCL ER (SR) 100 MG PO TB12: 100.0000 mg | ORAL_TABLET | Freq: Every day | ORAL | 3 refills | Status: DC

## 2018-01-11 MED ORDER — NICOTINE 21 MG/24HR TD PT24: 21.0000 mg | MEDICATED_PATCH | Freq: Every day | TRANSDERMAL | 2 refills | Status: DC

## 2018-01-11 NOTE — Progress Notes (Signed)
Continue nicoderm CQ, initially started in hospital. Sent 21mg  patch to [facility] [address].

## 2018-01-11 NOTE — Telephone Encounter (Signed)
D/c nicotine patch due to expense. Start wellbutrin SR 100mg  daily. New rx sent to [facility] [address]

## 2018-01-11 NOTE — Progress Notes (Signed)
D/c nicotine patch due to expense. Start wellbutrin SR 100mg  daily. New rx sent to [facility] [address]

## 2018-01-11 NOTE — Telephone Encounter (Signed)
Pt advised we send wellbutrin to [facility]

## 2018-01-11 NOTE — Telephone Encounter (Signed)
Continue nicoderm CQ, initially started in hospital. Sent 21mg  patch to [facility] [address].

## 2018-01-11 NOTE — Telephone Encounter (Signed)
Pt advised we send nicotine patch

## 2018-02-25 ENCOUNTER — Other Ambulatory Visit: Payer: Self-pay

## 2018-02-25 ENCOUNTER — Ambulatory Visit (INDEPENDENT_AMBULATORY_CARE_PROVIDER_SITE_OTHER): Payer: Medicare Other | Admitting: Nurse Practitioner

## 2018-02-25 ENCOUNTER — Encounter: Payer: Self-pay | Admitting: Nurse Practitioner

## 2018-02-25 VITALS — BP 123/70 | HR 92 | Temp 98.7°F | Ht 69.0 in | Wt 178.8 lb

## 2018-02-25 DIAGNOSIS — Z7689 Persons encountering health services in other specified circumstances: Secondary | ICD-10-CM

## 2018-02-25 DIAGNOSIS — Z131 Encounter for screening for diabetes mellitus: Secondary | ICD-10-CM

## 2018-02-25 DIAGNOSIS — K219 Gastro-esophageal reflux disease without esophagitis: Secondary | ICD-10-CM

## 2018-02-25 DIAGNOSIS — E890 Postprocedural hypothyroidism: Secondary | ICD-10-CM | POA: Diagnosis not present

## 2018-02-25 DIAGNOSIS — R350 Frequency of micturition: Secondary | ICD-10-CM

## 2018-02-25 DIAGNOSIS — K449 Diaphragmatic hernia without obstruction or gangrene: Secondary | ICD-10-CM
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
## 2018-02-25 NOTE — Patient Instructions (Addendum)
Todd Green,   Thank you for coming in to clinic today.  1. Continue your hand followup with orthopedics  2. Labs at [facility] this week.  These are non-fasting  3. Continue all current medication without changes.  Please schedule a follow-up appointment with Cassell Smiles, AGNP. Return in about 6 months (around 08/26/2018) for surgical hypothyroidism.  If you have any other questions or concerns, please feel free to call the clinic or send a message through [facility]. You may also schedule an earlier appointment if necessary.  You will receive a survey after today's visit either digitally by e-mail or paper by [employer]. Your experiences and feedback matter to [place].  Please respond so we know how we are doing as we provide care for you.   Cassell Smiles, DNP, AGNP-BC Adult Gerontology Nurse Practitioner [place]

## 2018-02-28 ENCOUNTER — Telehealth: Payer: Self-pay | Admitting: Nurse Practitioner

## 2018-02-28 ENCOUNTER — Encounter: Payer: Self-pay | Admitting: Nurse Practitioner

## 2018-02-28 DIAGNOSIS — K449 Diaphragmatic hernia without obstruction or gangrene: Secondary | ICD-10-CM

## 2018-02-28 DIAGNOSIS — K219 Gastro-esophageal reflux disease without esophagitis: Secondary | ICD-10-CM | POA: Insufficient documentation

## 2018-02-28 LAB — CBC WITH DIFFERENTIAL/PLATELET
Basophils Absolute: 0 10*3/uL (ref 0.0–0.2)
Basos: 0 %
EOS (ABSOLUTE): 0.2 10*3/uL (ref 0.0–0.4)
Eos: 3 %
Hematocrit: 41.1 % (ref 37.5–51.0)
Hemoglobin: 13.9 g/dL (ref 13.0–17.7)
Immature Grans (Abs): 0 10*3/uL (ref 0.0–0.1)
Immature Granulocytes: 0 %
Lymphocytes Absolute: 1 10*3/uL (ref 0.7–3.1)
Lymphs: 15 %
MCH: 29.6 pg (ref 26.6–33.0)
MCHC: 33.8 g/dL (ref 31.5–35.7)
MCV: 87 fL (ref 79–97)
Monocytes Absolute: 0.7 10*3/uL (ref 0.1–0.9)
Monocytes: 10 %
Neutrophils Absolute: 4.7 10*3/uL (ref 1.4–7.0)
Neutrophils: 72 %
Platelets: 329 10*3/uL (ref 150–450)
RBC: 4.7 x10E6/uL (ref 4.14–5.80)
RDW: 12.7 % (ref 12.3–15.4)
WBC: 6.5 10*3/uL (ref 3.4–10.8)

## 2018-02-28 LAB — HEMOGLOBIN A1C
Est. average glucose Bld gHb Est-mCnc: 123 mg/dL
Hgb A1c MFr Bld: 5.9 % — ABNORMAL HIGH (ref 4.8–5.6)

## 2018-02-28 LAB — TSH: TSH: 2.03 u[IU]/mL (ref 0.450–4.500)

## 2018-02-28 LAB — PSA: Prostate Specific Ag, Serum: 2.6 ng/mL (ref 0.0–4.0)

## 2018-02-28 NOTE — Telephone Encounter (Signed)
Pt called for lab results [phone]

## 2018-02-28 NOTE — Telephone Encounter (Signed)
Pt .called wanting his results. Requesting a call back [phone]

## 2018-02-28 NOTE — Telephone Encounter (Signed)
Pt.notified

## 2018-03-11 ENCOUNTER — Telehealth: Payer: Self-pay | Admitting: Family Medicine

## 2018-03-11 MED ORDER — OMEPRAZOLE 40 MG PO CPDR
DELAYED_RELEASE_CAPSULE | ORAL | 1 refills | Status: DC
Start: 2018-03-11 — End: 2018-03-14

## 2018-03-11 NOTE — Telephone Encounter (Signed)
Pt needs a refill on omeprazole sent to [facility].  He also asked about lab results [phone]

## 2018-03-11 NOTE — Telephone Encounter (Signed)
Rx send

## 2018-03-14 ENCOUNTER — Other Ambulatory Visit: Payer: Self-pay

## 2018-03-14 MED ORDER — OMEPRAZOLE 40 MG PO CPDR
DELAYED_RELEASE_CAPSULE | ORAL | 1 refills | Status: DC
Start: 2018-03-14 — End: 2018-07-15

## 2018-03-19 ENCOUNTER — Ambulatory Visit: Payer: Self-pay | Admitting: Adult Health

## 2018-03-20 ENCOUNTER — Ambulatory Visit: Payer: Self-pay | Admitting: Adult Health

## 2018-07-13 ENCOUNTER — Other Ambulatory Visit: Payer: Self-pay | Admitting: Nurse Practitioner

## 2018-07-13 DIAGNOSIS — K219 Gastro-esophageal reflux disease without esophagitis: Secondary | ICD-10-CM

## 2018-07-13 DIAGNOSIS — K449 Diaphragmatic hernia without obstruction or gangrene: Secondary | ICD-10-CM

## 2018-07-13 DIAGNOSIS — E890 Postprocedural hypothyroidism: Secondary | ICD-10-CM

## 2018-07-31 ENCOUNTER — Other Ambulatory Visit: Payer: Self-pay

## 2018-08-01 ENCOUNTER — Ambulatory Visit (INDEPENDENT_AMBULATORY_CARE_PROVIDER_SITE_OTHER): Payer: Medicare Other | Admitting: Nurse Practitioner

## 2018-08-01 ENCOUNTER — Other Ambulatory Visit: Payer: Self-pay

## 2018-08-01 ENCOUNTER — Encounter: Payer: Self-pay | Admitting: Nurse Practitioner

## 2018-08-01 DIAGNOSIS — J301 Allergic rhinitis due to pollen: Secondary | ICD-10-CM | POA: Diagnosis not present

## 2018-08-01 DIAGNOSIS — K219 Gastro-esophageal reflux disease without esophagitis: Secondary | ICD-10-CM | POA: Diagnosis not present

## 2018-08-01 DIAGNOSIS — E890 Postprocedural hypothyroidism: Secondary | ICD-10-CM | POA: Diagnosis not present

## 2018-08-01 DIAGNOSIS — K449 Diaphragmatic hernia without obstruction or gangrene: Secondary | ICD-10-CM

## 2018-08-01 MED ORDER — LEVOTHYROXINE SODIUM 137 MCG PO TABS: 137.0000 ug | ORAL_TABLET | Freq: Every day | ORAL | 1 refills | Status: DC

## 2018-08-01 MED ORDER — OMEPRAZOLE 40 MG PO CPDR: DELAYED_RELEASE_CAPSULE | ORAL | 1 refills | Status: DC

## 2018-08-01 MED ORDER — FLUTICASONE PROPIONATE 50 MCG/ACT NA SUSP: NASAL | 5 refills | Status: DC
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
## 2018-08-12 ENCOUNTER — Ambulatory Visit (INDEPENDENT_AMBULATORY_CARE_PROVIDER_SITE_OTHER): Payer: Medicare Other | Admitting: Nurse Practitioner

## 2018-08-12 ENCOUNTER — Other Ambulatory Visit: Payer: Self-pay

## 2018-08-12 ENCOUNTER — Encounter: Payer: Self-pay | Admitting: Nurse Practitioner

## 2018-08-12 DIAGNOSIS — F515 Nightmare disorder: Secondary | ICD-10-CM | POA: Diagnosis not present

## 2018-08-12 DIAGNOSIS — F5104 Psychophysiologic insomnia: Secondary | ICD-10-CM | POA: Diagnosis not present

## 2018-08-12 MED ORDER — TRAZODONE HCL 50 MG PO TABS: 25.0000 mg | ORAL_TABLET | Freq: Every day | ORAL | 2 refills | Status: DC
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
## 2018-08-15 ENCOUNTER — Other Ambulatory Visit: Payer: Medicare Other

## 2018-08-15 ENCOUNTER — Other Ambulatory Visit: Payer: Self-pay

## 2018-08-15 DIAGNOSIS — E890 Postprocedural hypothyroidism: Secondary | ICD-10-CM

## 2018-08-15 DIAGNOSIS — K449 Diaphragmatic hernia without obstruction or gangrene: Principal | ICD-10-CM

## 2018-08-15 DIAGNOSIS — K219 Gastro-esophageal reflux disease without esophagitis: Secondary | ICD-10-CM

## 2018-08-15 LAB — CBC WITH DIFFERENTIAL/PLATELET
Absolute Monocytes: 617 cells/uL (ref 200–950)
Basophils Absolute: 32 cells/uL (ref 0–200)
Basophils Relative: 0.5 %
Eosinophils Absolute: 202 cells/uL (ref 15–500)
Eosinophils Relative: 3.2 %
HCT: 41.3 % (ref 38.5–50.0)
Hemoglobin: 14 g/dL (ref 13.2–17.1)
Lymphs Abs: 863 cells/uL (ref 850–3900)
MCH: 30.6 pg (ref 27.0–33.0)
MCHC: 33.9 g/dL (ref 32.0–36.0)
MCV: 90.4 fL (ref 80.0–100.0)
MPV: 9.9 fL (ref 7.5–12.5)
Monocytes Relative: 9.8 %
Neutro Abs: 4586 cells/uL (ref 1500–7800)
Neutrophils Relative %: 72.8 %
Platelets: 304 10*3/uL (ref 140–400)
RBC: 4.57 10*6/uL (ref 4.20–5.80)
RDW: 12.4 % (ref 11.0–15.0)
Total Lymphocyte: 13.7 %
WBC: 6.3 10*3/uL (ref 3.8–10.8)

## 2018-08-20 ENCOUNTER — Telehealth: Payer: Self-pay

## 2018-08-20 DIAGNOSIS — E890 Postprocedural hypothyroidism: Secondary | ICD-10-CM

## 2018-08-20 NOTE — Telephone Encounter (Signed)
Patient wants to find out about lab test result also question for PSA advised that it was not ordered recently one from past year was normal but has question about TSH and CBC ordered recently. Somehow it was ordered differently and Lab has not drawn TSH ? Please suggest I can call patient back with your reply ?

## 2018-08-20 NOTE — Telephone Encounter (Signed)
Patient advised.

## 2018-08-20 NOTE — Telephone Encounter (Signed)
Covering inbox for Todd Green, AGPCNP-BC while she is out of office on maternity leave.  I have not reviewed this patient's lab test results yet as they are incomplete.  His last PSA was 02/2018, not due yet until 02/2019 for another PSA.  I have already placed new orders for TSH and Free T4 - to be recollected here - [facility]. We can review result and contact patient with results.  Apologize for any inconvenience on repeat blood draw, I am not sure where the issue was with this one if lab order error not collected.   Nobie Putnam, [facility] 08/20/2018, 12:51 PM

## 2018-08-21 LAB — TSH: TSH: 1.75 mIU/L (ref 0.40–4.50)

## 2018-08-23 ENCOUNTER — Telehealth: Payer: Self-pay

## 2018-08-23 NOTE — Telephone Encounter (Signed)
Result note is now completed for this patient.  Nobie Putnam, DO [facility] 08/23/2018, 9:14 AM

## 2018-08-23 NOTE — Telephone Encounter (Signed)
The pt called requesting his lab results. Please advise

## 2018-10-04 ENCOUNTER — Telehealth: Payer: Self-pay
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
## 2018-10-07 NOTE — Telephone Encounter (Addendum)
I notified the pt of Dr. Parks Ranger recommendation. 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 He states all he needs is an xray stating that his issues are resolved to present to his attorney. I explained to the patient if it's an xray he needs that all he needs to do is contact his treating physician and ask them to order the xray for outpatient radiology location in [place] since transportation is a issue. He verbalize understanding.

## 2018-10-07 NOTE — Telephone Encounter (Signed)
Attempted to contact the pt, no answer. LMOM to return my call.  

## 2018-11-11 ENCOUNTER — Ambulatory Visit (INDEPENDENT_AMBULATORY_CARE_PROVIDER_SITE_OTHER): Payer: Medicare Other | Admitting: Nurse Practitioner

## 2018-11-11 ENCOUNTER — Encounter: Payer: Self-pay | Admitting: Nurse Practitioner

## 2018-11-11 ENCOUNTER — Other Ambulatory Visit: Payer: Self-pay

## 2018-11-11 DIAGNOSIS — E890 Postprocedural hypothyroidism: Secondary | ICD-10-CM | POA: Diagnosis not present

## 2018-11-11 DIAGNOSIS — R251 Tremor, unspecified: Secondary | ICD-10-CM | POA: Diagnosis not present

## 2018-11-11 MED ORDER — PROPRANOLOL HCL ER 60 MG PO CP24
60.0000 mg | ORAL_CAPSULE | Freq: Every day | ORAL | 0 refills | Status: DC
Start: 2018-11-11 — End: 2018-11-28
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
## 2018-11-12 ENCOUNTER — Encounter: Payer: Self-pay | Admitting: Nurse Practitioner

## 2018-11-22 ENCOUNTER — Other Ambulatory Visit: Payer: Medicare Other

## 2018-11-28 ENCOUNTER — Other Ambulatory Visit: Payer: Self-pay

## 2018-11-28 DIAGNOSIS — R251 Tremor, unspecified: Secondary | ICD-10-CM

## 2018-11-28 LAB — TSH+FREE T4: TSH W/REFLEX TO FT4: 2.59 mIU/L (ref 0.40–4.50)

## 2018-11-28 MED ORDER — PROPRANOLOL HCL ER 60 MG PO CP24: 60.0000 mg | ORAL_CAPSULE | Freq: Every day | ORAL | 1 refills | Status: DC

## 2018-12-04 ENCOUNTER — Other Ambulatory Visit: Payer: Self-pay | Admitting: Nurse Practitioner

## 2018-12-04 NOTE — Telephone Encounter (Signed)
Pt  Called requesting refill on propranolol 60 mg  Called into [facility]

## 2018-12-18 ENCOUNTER — Telehealth: Payer: Self-pay | Admitting: Nurse Practitioner

## 2018-12-18 DIAGNOSIS — M79642 Pain in left hand: Secondary | ICD-10-CM

## 2018-12-18 MED ORDER — DICLOFENAC SODIUM 1 % TD GEL: 2.0000 g | Freq: Four times a day (QID) | TRANSDERMAL | 5 refills | Status: DC

## 2018-12-18 NOTE — Telephone Encounter (Signed)
Diclofenac gel called to [facility] in [place].

## 2018-12-18 NOTE — Telephone Encounter (Signed)
Pt called requesting for something to be called in for his arthritis for his hand. Pt  Call back  # is [phone]

## 2018-12-23 ENCOUNTER — Other Ambulatory Visit: Payer: Self-pay

## 2018-12-23 ENCOUNTER — Ambulatory Visit (INDEPENDENT_AMBULATORY_CARE_PROVIDER_SITE_OTHER): Payer: Medicare Other

## 2018-12-23 DIAGNOSIS — Z23 Encounter for immunization: Secondary | ICD-10-CM | POA: Diagnosis not present

## 2018-12-23 NOTE — Patient Instructions (Signed)

## 2019-01-29 ENCOUNTER — Other Ambulatory Visit: Payer: Self-pay

## 2019-01-29 ENCOUNTER — Ambulatory Visit (INDEPENDENT_AMBULATORY_CARE_PROVIDER_SITE_OTHER): Payer: Medicare Other | Admitting: Nurse Practitioner

## 2019-01-29 ENCOUNTER — Encounter: Payer: Self-pay | Admitting: Nurse Practitioner

## 2019-01-29 VITALS — BP 135/68 | HR 60 | Ht 69.0 in | Wt 177.2 lb

## 2019-01-29 DIAGNOSIS — L309 Dermatitis, unspecified: Secondary | ICD-10-CM | POA: Diagnosis not present

## 2019-01-29 DIAGNOSIS — M5386 Other specified dorsopathies, lumbar region: Secondary | ICD-10-CM

## 2019-01-29 DIAGNOSIS — R350 Frequency of micturition: Secondary | ICD-10-CM | POA: Diagnosis not present

## 2019-01-29 DIAGNOSIS — R7309 Other abnormal glucose: Secondary | ICD-10-CM | POA: Diagnosis not present

## 2019-01-29 LAB — POCT URINALYSIS DIPSTICK
Bilirubin, UA: NEGATIVE
Blood, UA: NEGATIVE
Glucose, UA: NEGATIVE
Ketones, UA: NEGATIVE
Leukocytes, UA: NEGATIVE
Nitrite, UA: NEGATIVE
Protein, UA: NEGATIVE
Spec Grav, UA: 1.015 (ref 1.010–1.025)
Urobilinogen, UA: 0.2 E.U./dL
pH, UA: 7 (ref 5.0–8.0)

## 2019-01-29 MED ORDER — TRIAMCINOLONE ACETONIDE 0.025 % EX OINT: 1.0000 "application " | TOPICAL_OINTMENT | Freq: Two times a day (BID) | CUTANEOUS | 0 refills | Status: DC

## 2019-01-29 NOTE — Patient Instructions (Addendum)
Todd Green,   Thank you for coming in to clinic today.  1. For your rash on your face, use a facial cleanser around your mouth and nose.  This is related to your mask usage and bacteria buildup.  2. Your urinary frequency is possibly related to sugar intake. - labs today  3. For bloating, - Take regular stool softener - take 2-3 days per week up to daily as needed.  4. For your hand: - If you don't improve much with your trainer, consider asking [place] for a referral to physical therapy.  Please schedule a follow-up appointment. Return 1-2 weeks if symptoms worsen or fail to improve.  If you have any other questions or concerns, please feel free to call the clinic or send a message through [place]. You may also schedule an earlier appointment if necessary.  You will receive a survey after today's visit either digitally by e-mail or paper by [employer]. Your experiences and feedback matter to [place].  Please respond so we know how we are doing as we provide care for you.  Cassell Smiles, DNP, AGNP-BC Adult Gerontology Nurse Practitioner [place]

## 2019-01-29 NOTE — Progress Notes (Signed)
Subjective:    Patient ID: Todd Green, male    DOB: 01-23-1951, 68 y.o.   MRN: [id number]  Todd Green is a 68 y.o. male presenting on 01/29/2019 for Urinary Frequency (with some urinary incontinencex2 week), Rash (around the mouth and under the nose possibly from the mask ), and Bloated (with intermittent constipation)  HPI Rash Patient notes onset over last 3-4 days of rash under mask around mouth and nose.  Rash is erythematous, mildly pruritic.  Decreased/slow mobility Left hand had surgery and has difficulty pushing up with the left arm.  Is hard to get out of the truck.  Notes is unable to bear weight on left hand.  Is having a hard time twisting/moving from seated.  Urinary frequency Patient had onset of symptoms of urinary frequency 2 weeks ago.  Patient notes urine is clear yellow.  Concerning to him today is intermittent urinary incontinence. - No urinary pain/bladder pain - Patient notes he is eating a lot of carbs.  Is concerned about possibly due to elevated glucose.  When he eats more sweets/ice cream he has to go to the bathroom a lot. Since stopping the carbs, notes improved symptoms.    Social History   Tobacco Use  . Smoking status: Never Smoker  . Smokeless tobacco: Never Used  Substance Use Topics  . Alcohol use: Not Currently  . Drug use: Not Currently    Review of Systems Per HPI unless specifically indicated above     Objective:    BP 135/68 (BP Location: Right Arm, Patient Position: Sitting, Cuff Size: Normal)   Pulse 60   Ht 5\' 9"  (1.753 m)   Wt 177 lb 3.2 oz (80.4 kg)   BMI 26.17 kg/m   Wt Readings from Last 3 Encounters:  01/29/19 177 lb 3.2 oz (80.4 kg)  02/25/18 178 lb 12.8 oz (81.1 kg)  10/31/17 178 lb 3.2 oz (80.8 kg)    Physical Exam Vitals signs reviewed.  Constitutional:      General: He is not in acute distress.    Appearance: He is well-developed and normal weight.  HENT:     Head: Normocephalic and atraumatic.     Nose:      Comments: Patient with mild erythema around mouth, chin, nose under mask  Cardiovascular:     Rate and Rhythm: Normal rate and regular rhythm.     Pulses: Normal pulses.     Heart sounds: Normal heart sounds.  Pulmonary:     Effort: Pulmonary effort is normal.     Breath sounds: Normal breath sounds.  Abdominal:     General: Abdomen is flat. Bowel sounds are normal. There is no distension.     Tenderness: There is no abdominal tenderness. There is no right CVA tenderness or left CVA tenderness.     Comments: Patient with known prostate enlargement-declines exam today  Musculoskeletal:     Comments: Left hand with severely limited ROM, decreased grip strength.  Normal strength at elbow.  Patient with limited ability to rotate spine at hips, limited neck rotation  Skin:    General: Skin is warm and dry.     Capillary Refill: Capillary refill takes less than 2 seconds.  Neurological:     General: No focal deficit present.     Mental Status: He is alert and oriented to person, place, and time. Mental status is at baseline.  Psychiatric:        Mood and Affect: Mood normal.  Behavior: Behavior normal.        Thought Content: Thought content normal.        Judgment: Judgment normal.    Results for orders placed or performed in visit on 01/29/19  POCT Urinalysis Dipstick  Result Value Ref Range   Color, UA yellow    Clarity, UA clear    Glucose, UA Negative Negative   Bilirubin, UA negative    Ketones, UA negative    Spec Grav, UA 1.015 1.010 - 1.025   Blood, UA negative    pH, UA 7.0 5.0 - 8.0   Protein, UA Negative Negative   Urobilinogen, UA 0.2 0.2 or 1.0 E.U./dL   Nitrite, UA negative    Leukocytes, UA Negative Negative   Appearance     Odor        Assessment & Plan:   Problem List Items Addressed This Visit    None    Visit Diagnoses    Urinary frequency    -  Primary Unknown primary cause.  Dipstick today shows no infection concerns, no elevated  glucose.  Cannot exclude prostate enlargement, hyperglycemia as causes.  IPSS low severity score.  Plan: 1. Labs today - screen DM, kidney function 2. Follow-up prn after labs.   Relevant Orders   POCT Urinalysis Dipstick (Completed)   Hemoglobin A1c (Completed)   Basic Metabolic Panel (BMET) (Completed)   Dermatitis     New finding today, mild dermatitis - likely irritant/contact dermatitis.   - START triamcinolone ointment bid x 7 days.  Limit use to face only to 7 days, repeat if needed. - Follow-up prn   Relevant Medications   triamcinolone (KENALOG) 0.025 % ointment   Abnormal glucose       Decreased ROM of lumbar spine     Patient with limited mobility moving from seated to standing from low chair.  Limited assistance with left hand.    Plan: 1. Offered physical therapy.  Patient declines 2. Encourage ROM exercises to improve strength and flexibility 3. Follow-up prn worsening or if decides to change plan and accept physical therapy.      Meds ordered this encounter  Medications  . triamcinolone (KENALOG) 0.025 % ointment    Sig: Apply 1 application topically 2 (two) times daily.    Dispense:  30 g    Refill:  0    Order Specific Question:   Supervising Provider    Answer:   Olin Hauser [[id number]]    Follow up plan: Return 1-2 weeks if symptoms worsen or fail to improve.  Cassell Smiles, DNP, AGPCNP-BC Adult Gerontology Primary Care Nurse Practitioner [facility] 01/29/2019, 8:38 AM

## 2019-01-30 ENCOUNTER — Telehealth: Payer: Self-pay

## 2019-01-30 LAB — BASIC METABOLIC PANEL
BUN/Creatinine Ratio: 34 (calc) — ABNORMAL HIGH (ref 6–22)
BUN: 28 mg/dL — ABNORMAL HIGH (ref 7–25)
CO2: 26 mmol/L (ref 20–32)
Calcium: 9.4 mg/dL (ref 8.6–10.3)
Chloride: 110 mmol/L (ref 98–110)
Creat: 0.83 mg/dL (ref 0.70–1.25)
Glucose, Bld: 88 mg/dL (ref 65–99)
Potassium: 4.7 mmol/L (ref 3.5–5.3)
Sodium: 142 mmol/L (ref 135–146)

## 2019-01-30 LAB — HEMOGLOBIN A1C
Hgb A1c MFr Bld: 5.6 % of total Hgb (ref <5.7)
Mean Plasma Glucose: 114 (calc)
eAG (mmol/L): 6.3 (calc)

## 2019-01-30 NOTE — Telephone Encounter (Signed)
The pt was notified of his lab results. He verbalize understanding. No questions or concerns.

## 2019-02-03 ENCOUNTER — Ambulatory Visit (INDEPENDENT_AMBULATORY_CARE_PROVIDER_SITE_OTHER): Payer: Medicare Other | Admitting: Nurse Practitioner

## 2019-02-03 ENCOUNTER — Other Ambulatory Visit: Payer: Self-pay

## 2019-02-03 ENCOUNTER — Encounter: Payer: Self-pay | Admitting: Nurse Practitioner

## 2019-02-03 VITALS — BP 122/70 | HR 64 | Ht 69.0 in | Wt 180.0 lb

## 2019-02-03 DIAGNOSIS — S41112A Laceration without foreign body of left upper arm, initial encounter: Secondary | ICD-10-CM

## 2019-02-03 DIAGNOSIS — R351 Nocturia: Secondary | ICD-10-CM | POA: Diagnosis not present

## 2019-02-03 DIAGNOSIS — Z0000 Encounter for general adult medical examination without abnormal findings: Secondary | ICD-10-CM

## 2019-02-03 DIAGNOSIS — Z23 Encounter for immunization: Secondary | ICD-10-CM | POA: Diagnosis not present

## 2019-02-03 NOTE — Patient Instructions (Addendum)
Todd Green,   Thank you for coming in to clinic today.  1. Labs due at next check for Hepatitis C screen and prostate.  2. Continue active lifestyle  3. Eat a healthy diet with lots of vegetables, fruits instead of sweets  Please schedule a follow-up appointment. Return in about 1 year (around 02/03/2020) for annual physical.  If you have any other questions or concerns, please feel free to call the clinic or send a message through [place]. You may also schedule an earlier appointment if necessary.  You will receive a survey after today's visit either digitally by e-mail or paper by [employer]. Your experiences and feedback matter to [place].  Please respond so we know how we are doing as we provide care for you.  Cassell Smiles, DNP, AGNP-BC Adult Gerontology Nurse Practitioner [facility], [facility]   Tdap Vaccine (Tetanus, Diphtheria and Pertussis): What You Need to Know 1. Why get vaccinated? Tetanus, diphtheria and pertussis are very serious diseases. Tdap vaccine can protect [place] from these diseases. And, Tdap vaccine given to pregnant women can protect newborn babies against pertussis.Marland Kitchen TETANUS (Lockjaw) is rare in the [place] today. It causes painful muscle tightening and stiffness, usually all over the body.  It can lead to tightening of muscles in the head and neck so you can't open your mouth, swallow, or sometimes even breathe. Tetanus kills about 1 out of 10 people who are infected even after receiving the best medical care. DIPHTHERIA is also rare in the [place] today. It can cause a thick coating to form in the back of the throat.  It can lead to breathing problems, heart failure, paralysis, and death. PERTUSSIS (Whooping Cough) causes severe coughing spells, which can cause difficulty breathing, vomiting and disturbed sleep.  It can also lead to weight loss, incontinence, and rib fractures. Up to 2 in 100 adolescents and 5 in 100 adults with  pertussis are hospitalized or have complications, which could include pneumonia or death. These diseases are caused by bacteria. Diphtheria and pertussis are spread from person to person through secretions from coughing or sneezing. Tetanus enters the body through cuts, scratches, or wounds. Before vaccines, as many as 200,000 cases of diphtheria, 200,000 cases of pertussis, and hundreds of cases of tetanus, were reported in the [place] each year. Since vaccination began, reports of cases for tetanus and diphtheria have dropped by about 99% and for pertussis by about 80%. 2. Tdap vaccine Tdap vaccine can protect adolescents and adults from tetanus, diphtheria, and pertussis. One dose of Tdap is routinely given at age 13 or 19. People who did not get Tdap at that age should get it as soon as possible. Tdap is especially important for healthcare professionals and anyone having close contact with a baby younger than 12 months. Pregnant women should get a dose of Tdap during every pregnancy, to protect the newborn from pertussis. Infants are most at risk for severe, life-threatening complications from pertussis. Another vaccine, called Td, protects against tetanus and diphtheria, but not pertussis. A Td booster should be given every 10 years. Tdap may be given as one of these boosters if you have never gotten Tdap before. Tdap may also be given after a severe cut or burn to prevent tetanus infection. Your doctor or the person giving you the vaccine can give you more information. Tdap may safely be given at the same time as other vaccines. 3. Some people should not get this vaccine  A person  who has ever had a life-threatening allergic reaction after a previous dose of any diphtheria, tetanus or pertussis containing vaccine, OR has a severe allergy to any part of this vaccine, should not get Tdap vaccine. Tell the person giving the vaccine about any severe allergies.  Anyone who had coma or long  repeated seizures within 7 days after a childhood dose of DTP or DTaP, or a previous dose of Tdap, should not get Tdap, unless a cause other than the vaccine was found. They can still get Td.  Talk to your doctor if you: ? have seizures or another nervous system problem, ? had severe pain or swelling after any vaccine containing diphtheria, tetanus or pertussis, ? ever had a condition called Guillain-Barr Syndrome (GBS), ? aren't feeling well on the day the shot is scheduled. 4. Risks With any medicine, including vaccines, there is a chance of side effects. These are usually mild and go away on their own. Serious reactions are also possible but are rare. Most people who get Tdap vaccine do not have any problems with it. Mild problems following Tdap (Did not interfere with activities)  Pain where the shot was given (about 3 in 4 adolescents or 2 in 3 adults)  Redness or swelling where the shot was given (about 1 person in 5)  Mild fever of at least 100.40F (up to about 1 in 25 adolescents or 1 in 100 adults)  Headache (about 3 or 4 people in 10)  Tiredness (about 1 person in 3 or 4)  Nausea, vomiting, diarrhea, stomach ache (up to 1 in 4 adolescents or 1 in 10 adults)  Chills, sore joints (about 1 person in 10)  Body aches (about 1 person in 3 or 4)  Rash, swollen glands (uncommon) Moderate problems following Tdap (Interfered with activities, but did not require medical attention)  Pain where the shot was given (up to 1 in 5 or 6)  Redness or swelling where the shot was given (up to about 1 in 16 adolescents or 1 in 12 adults)  Fever over 102F (about 1 in 100 adolescents or 1 in 250 adults)  Headache (about 1 in 7 adolescents or 1 in 10 adults)  Nausea, vomiting, diarrhea, stomach ache (up to 1 or 3 people in 100)  Swelling of the entire arm where the shot was given (up to about 1 in 500). Severe problems following Tdap (Unable to perform usual activities; required  medical attention)  Swelling, severe pain, bleeding and redness in the arm where the shot was given (rare). Problems that could happen after any vaccine:  People sometimes faint after a medical procedure, including vaccination. Sitting or lying down for about 15 minutes can help prevent fainting, and injuries caused by a fall. Tell your doctor if you feel dizzy, or have vision changes or ringing in the ears.  Some people get severe pain in the shoulder and have difficulty moving the arm where a shot was given. This happens very rarely.  Any medication can cause a severe allergic reaction. Such reactions from a vaccine are very rare, estimated at fewer than 1 in a million doses, and would happen within a few minutes to a few hours after the vaccination. As with any medicine, there is a very remote chance of a vaccine causing a serious injury or death. The safety of vaccines is always being monitored. For more information, visit: http://www.aguilar.org/ 5. What if there is a serious problem? What should I look for?  Look for  anything that concerns you, such as signs of a severe allergic reaction, very high fever, or unusual behavior. Signs of a severe allergic reaction can include hives, swelling of the face and throat, difficulty breathing, a fast heartbeat, dizziness, and weakness. These would usually start a few minutes to a few hours after the vaccination. What should I do?  If you think it is a severe allergic reaction or other emergency that can't wait, call 9-1-1 or get the person to the nearest hospital. Otherwise, call your doctor.  Afterward, the reaction should be reported to the Vaccine Adverse Event Reporting System (VAERS). Your doctor might file this report, or you can do it yourself through the VAERS web site at www.vaers.SamedayNews.es, or by calling (762) 284-3040. VAERS does not give medical advice. 6. The National Vaccine Injury Compensation Program The [employer] Vaccine Injury  Compensation Program (VICP) is a federal program that was created to compensate people who may have been injured by certain vaccines. Persons who believe they may have been injured by a vaccine can learn about the program and about filing a claim by calling 216 396 3873 or visiting the [place] website at GoldCloset.com.ee. There is a time limit to file a claim for compensation. 7. How can I learn more?  Ask your doctor. He or she can give you the vaccine package insert or suggest other sources of information.  Call your local or state health department.  Contact the Centers for Disease Control and Prevention (CDC): ? Call 743-280-0794 (1-800-CDC-INFO) or ? Visit CDC's website at http://hunter.com/ Vaccine Information Statement Tdap Vaccine (06/17/2013) This information is not intended to replace advice given to you by your health care provider. Make sure you discuss any questions you have with your health care provider. Document Released: 10/10/2011 Document Revised: 11/26/2017 Document Reviewed: 11/26/2017 Elsevier Interactive Patient Education  [employer].
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
## 2019-02-08 ENCOUNTER — Encounter: Payer: Self-pay | Admitting: Nurse Practitioner

## 2019-02-12 ENCOUNTER — Encounter: Payer: Self-pay | Admitting: Nurse Practitioner

## 2019-02-26 ENCOUNTER — Other Ambulatory Visit: Payer: Self-pay | Admitting: Nurse Practitioner

## 2019-02-26 DIAGNOSIS — E890 Postprocedural hypothyroidism: Secondary | ICD-10-CM

## 2019-02-26 DIAGNOSIS — K449 Diaphragmatic hernia without obstruction or gangrene: Secondary | ICD-10-CM

## 2019-02-26 DIAGNOSIS — K219 Gastro-esophageal reflux disease without esophagitis: Secondary | ICD-10-CM

## 2019-03-18 ENCOUNTER — Other Ambulatory Visit: Payer: Self-pay

## 2019-03-18 ENCOUNTER — Ambulatory Visit (INDEPENDENT_AMBULATORY_CARE_PROVIDER_SITE_OTHER): Payer: Medicare Other | Admitting: Family Medicine

## 2019-03-18 ENCOUNTER — Encounter: Payer: Self-pay | Admitting: Family Medicine

## 2019-03-18 VITALS — BP 129/71 | HR 75 | Temp 97.5°F | Ht 69.0 in | Wt 179.2 lb

## 2019-03-18 DIAGNOSIS — R350 Frequency of micturition: Secondary | ICD-10-CM

## 2019-03-18 DIAGNOSIS — S199XXA Unspecified injury of neck, initial encounter: Secondary | ICD-10-CM

## 2019-03-18 DIAGNOSIS — N401 Enlarged prostate with lower urinary tract symptoms: Secondary | ICD-10-CM | POA: Diagnosis not present

## 2019-03-18 DIAGNOSIS — Z8781 Personal history of (healed) traumatic fracture: Secondary | ICD-10-CM | POA: Diagnosis not present

## 2019-03-18 DIAGNOSIS — N138 Other obstructive and reflux uropathy: Secondary | ICD-10-CM

## 2019-03-18 DIAGNOSIS — S134XXA Sprain of ligaments of cervical spine, initial encounter: Secondary | ICD-10-CM

## 2019-03-18 DIAGNOSIS — E890 Postprocedural hypothyroidism: Secondary | ICD-10-CM

## 2019-03-18 LAB — POCT URINALYSIS DIPSTICK
Bilirubin, UA: NEGATIVE
Blood, UA: NEGATIVE
Glucose, UA: NEGATIVE
Ketones, UA: NEGATIVE
Leukocytes, UA: NEGATIVE
Nitrite, UA: NEGATIVE
Protein, UA: NEGATIVE
Spec Grav, UA: >=1.03 — AB (ref 1.010–1.025)
Urobilinogen, UA: 0.2 E.U./dL
pH, UA: 5 (ref 5.0–8.0)

## 2019-03-18 MED ORDER — TAMSULOSIN HCL 0.4 MG PO CAPS: 0.4000 mg | ORAL_CAPSULE | Freq: Every day | ORAL | 2 refills | Status: DC

## 2019-03-18 NOTE — Patient Instructions (Addendum)
Thank you for coming to the office today.  Start Tamsulosin Flomax 0.4mg  once daily in morning, stay well hydrated, CAUTION when standing up suddenly to avoid lightheadedness  Handicap placard for up to 5 years.  Follow-up sooner if urinary symptoms not improved  Please schedule a Follow-up Appointment to: Return in about 3 months (around 06/18/2019) for BPH Urinary frequency / meet new provider.  If you have any other questions or concerns, please feel free to call the office or send a message through [place]. You may also schedule an earlier appointment if necessary.  Additionally, you may be receiving a survey about your experience at our office within a few days to 1 week by e-mail or mail. We value your feedback.  Nobie Putnam, DO [place]
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
## 2019-04-14 ENCOUNTER — Other Ambulatory Visit: Payer: Self-pay | Admitting: Nurse Practitioner

## 2019-04-14 DIAGNOSIS — R251 Tremor, unspecified: Secondary | ICD-10-CM

## 2019-05-14 ENCOUNTER — Ambulatory Visit (INDEPENDENT_AMBULATORY_CARE_PROVIDER_SITE_OTHER): Payer: Medicare Other | Admitting: Family Medicine

## 2019-05-14 ENCOUNTER — Other Ambulatory Visit: Payer: Self-pay

## 2019-05-14 ENCOUNTER — Encounter: Payer: Self-pay | Admitting: Family Medicine

## 2019-05-14 VITALS — BP 115/76 | HR 76 | Temp 97.3°F | Resp 16 | Ht 69.0 in | Wt 185.0 lb

## 2019-05-14 DIAGNOSIS — Z1211 Encounter for screening for malignant neoplasm of colon: Secondary | ICD-10-CM | POA: Diagnosis not present

## 2019-05-14 DIAGNOSIS — S0121XA Laceration without foreign body of nose, initial encounter: Secondary | ICD-10-CM | POA: Diagnosis not present

## 2019-05-14 DIAGNOSIS — S0081XA Abrasion of other part of head, initial encounter: Secondary | ICD-10-CM

## 2019-05-14 DIAGNOSIS — R5383 Other fatigue: Secondary | ICD-10-CM | POA: Diagnosis not present

## 2019-05-14 DIAGNOSIS — W19XXXA Unspecified fall, initial encounter: Secondary | ICD-10-CM | POA: Diagnosis not present

## 2019-05-14 NOTE — Progress Notes (Signed)
Subjective:    Patient ID: Todd Green, male    DOB: January 16, 1951, 69 y.o.   MRN: [id number]  Todd Green is a 69 y.o. male presenting on 05/14/2019 for Fall (onset today, he tripped and has couple of abrasions on forehead, nose and some knee pain on right side due to fall )   HPI    Fall Injury / Forehead Abrasion / Nose Laceration Reports new injury earlier today within past 1-2 hours he had fall accidental, tripped on curb and fell down and injured face. He had some abrasion and bleeding forehead and some swelling localized in that area, only some tenderness to touch by his report, he also has a laceration on bridge of nose he put bandaid on and came to office for evaluation. - He uses handicap placard as well to help with mobility when parking in public  No other injury to extremity. He has chronic neck pain and stiffness, history of neck surgery.  Denies any issue with loss of consciousness, confusion, dizziness, nausea vomiting, deformity to nose or unable to breath or significant epistaxis.  Additional concerns  Fatigue, chronic He requests testosterone test to be performed next week due to fatigue, he has concerns about Low T. Has not had prior test that I can see available.  Asking about COVID vaccine for age 42+, he asks how to sign up   Health Maintenance: Colon CA Screening - today patient requests to have Cologuard done. He has history of prior colonoscopy. He has fam history of colorectal cancer.  Depression screen [facility] 2/9 05/14/2019 08/12/2018 10/31/2017  Decreased Interest 0 0 0  Down, Depressed, Hopeless 0 0 0  PHQ - 2 Score 0 0 0  Altered sleeping - 0 -  Tired, decreased energy - 0 -  Change in appetite - 0 -  Feeling bad or failure about yourself  - 0 -  Trouble concentrating - 0 -  Moving slowly or fidgety/restless - 0 -  Suicidal thoughts - 0 -  PHQ-9 Score - 0 -  Difficult doing work/chores - Not difficult at all -    Social History   Tobacco Use    . Smoking status: Never Smoker  . Smokeless tobacco: Never Used  Substance Use Topics  . Alcohol use: Not Currently  . Drug use: Not Currently    Review of Systems Per HPI unless specifically indicated above     Objective:    BP 115/76   Pulse 76   Temp (!) 97.3 F (36.3 C) (Oral)   Resp 16   Ht 5\' 9"  (1.753 m)   Wt 185 lb (83.9 kg)   BMI 27.32 kg/m   Wt Readings from Last 3 Encounters:  05/14/19 185 lb (83.9 kg)  03/18/19 179 lb 3.2 oz (81.3 kg)  02/03/19 180 lb (81.6 kg)    Physical Exam Vitals and nursing note reviewed.  Constitutional:      General: He is not in acute distress.    Appearance: He is well-developed. He is not diaphoretic.     Comments: Well-appearing, comfortable, cooperative  HENT:     Head: Normocephalic.     Comments: Forehead with superficial abrasion central location 3-4 cm approx area slight oozing and abrasion but no active bleeding, no laceration, some localized surrounding edema and mild ecchymosis, minimal or no tenderness on palpation.    Nose:     Comments: Small 1 cm laceration on bridge of nose. No deformity of nose, nares evaluated with patency, no  epistaxis Eyes:     General:        Right eye: No discharge.        Left eye: No discharge.     Conjunctiva/sclera: Conjunctivae normal.  Cardiovascular:     Rate and Rhythm: Normal rate.  Pulmonary:     Effort: Pulmonary effort is normal.  Skin:    General: Skin is warm and dry.     Findings: No erythema or rash.  Neurological:     Mental Status: He is alert and oriented to person, place, and time.  Psychiatric:        Behavior: Behavior normal.     Comments: Well groomed, good eye contact, normal speech and thoughts    Results for orders placed or performed in visit on 03/18/19  POCT Urinalysis Dipstick  Result Value Ref Range   Color, UA yellow    Clarity, UA cloudy    Glucose, UA Negative Negative   Bilirubin, UA negative    Ketones, UA negative    Spec Grav, UA  >=1.030 (A) 1.010 - 1.025   Blood, UA negative    pH, UA 5.0 5.0 - 8.0   Protein, UA Negative Negative   Urobilinogen, UA 0.2 0.2 or 1.0 E.U./dL   Nitrite, UA negative    Leukocytes, UA Negative Negative   Appearance cloudy    Odor        Assessment & Plan:   Problem List Items Addressed This Visit    None    Visit Diagnoses    Laceration of nose, initial encounter    -  Primary   Injury due to fall, initial encounter       Screening for colon cancer       Relevant Orders   Cologuard   Other fatigue       Relevant Orders   Testosterone   Decreased energy       Relevant Orders   Testosterone   Abrasion of forehead, initial encounter          #Fall, Forehead contusion / Nasal laceration Clinically with several superficial injuries identified on exam, no other focal deficit or extremity or joint injury Concern with head injury but not having any complications or confusion or systemic symptoms or other concerns  Used basin of sterile saline and sterile gauze for rinsing the abrasions and laceration forehead and nose, and then used topical antibiotic ointment and bandaid on nose, basic wound care. No suture required.  Reassurance, he can continue to wash wounds with soap and water as they heal, antibiotic ointment PRN, notify us if any worsening or new concerns, spreading redness concern for secondary infection  #Fatigue Check testosterone in AM next week  Additionally Signed him up for COVID19 vaccine waiting list for age 66 patients through [name], he will receive call or email when it is available.  No orders of the defined types were placed in this encounter.    Follow up plan: Return if symptoms worsen or fail to improve.   Nobie Putnam, [facility] 05/14/2019, 1:30 PM

## 2019-05-14 NOTE — Patient Instructions (Addendum)
Thank you for coming to the office today.  Signed up to [place] vaccine - stay tuned for updates via email and they will tell you when and where you can get your first dose COVID19 through [place] basic wound care, can rinse in shower or with warm wash cloth, soapy water  Can use topical antibiotic ointment 1-2 times a day as needed as healing.  If develop redness spreading drainage or fever or worsening, then notify office.  Cologuard will order and send this to you.  TESTOSTERONE test has to be 8am to 9am only.  DUE for FASTING BLOOD WORK (no food or drink after midnight before the lab appointment, only water or coffee without cream/sugar on the morning of)  SCHEDULE "Lab Only" visit in the morning at the clinic for lab draw in 1 week  - Make sure Lab Only appointment is at about 1 week before your next appointment, so that results will be available  For Lab Results, once available within 2-3 days of blood draw, you can can log in to MyChart online to view your results and a brief explanation. Also, we can discuss results at next follow-up visit.    Please schedule a Follow-up Appointment to: Return if symptoms worsen or fail to improve.  If you have any other questions or concerns, please feel free to call the office or send a message through [place]. You may also schedule an earlier appointment if necessary.  Additionally, you may be receiving a survey about your experience at our office within a few days to 1 week by e-mail or mail. We value your feedback.  Nobie Putnam, DO [place]

## 2019-05-20 ENCOUNTER — Other Ambulatory Visit: Payer: Medicare Other

## 2019-05-21 ENCOUNTER — Other Ambulatory Visit: Payer: Self-pay

## 2019-05-21 ENCOUNTER — Other Ambulatory Visit: Payer: Medicare Other

## 2019-05-21 DIAGNOSIS — R7989 Other specified abnormal findings of blood chemistry: Secondary | ICD-10-CM

## 2019-05-21 DIAGNOSIS — R5383 Other fatigue: Secondary | ICD-10-CM

## 2019-05-21 LAB — TESTOSTERONE: Testosterone: 192 ng/dL — ABNORMAL LOW (ref 250–827)

## 2019-05-22 ENCOUNTER — Telehealth: Payer: Self-pay | Admitting: Family Medicine

## 2019-05-22 DIAGNOSIS — R7989 Other specified abnormal findings of blood chemistry: Secondary | ICD-10-CM

## 2019-05-22 NOTE — Telephone Encounter (Signed)
Reason for Referral: Low testosterone 192, with fatigue  Referral discussed with patient: yes  Best contact number of patient for referral team: [phone] (H)  Has patient been seen by a specialist for this issue before: no  If so, who (practice/provider): n/a  Does the patient wish to return: n/a  Patient provider preference for referral: Dr Maryan Puls  Patient location preference for referral: [facility]  Nobie Putnam, [facility] 05/22/2019, 6:38 PM

## 2019-05-23 ENCOUNTER — Telehealth: Payer: Self-pay | Admitting: Family Medicine

## 2019-05-23 NOTE — Telephone Encounter (Signed)
Patient called back. He will keep apt with Dr Ambrose Pancoast urology next week on [place], he said that they told him weekly injection for now, I advised him that likely it will change to longer time between injections he can ask them to clarify that first, and also they can sometimes do self training and injections. Advised that I don't normally start patient on these medications so he should stick with urology. He understands and agrees to go next week to Urology, in the future will have to figure out alternative option if he cannot afford it. He said [facility] told him would take a month or more to get in.  Nobie Putnam, [facility] 05/23/2019, 1:33 PM

## 2019-05-23 NOTE — Telephone Encounter (Signed)
Attempted to call patient at 1:15pm today 05/23/19, did not reach him, left a detailed voicemail, advised again that I would not plan to start testosterone supplemental treatment, and that he may need further testing and evaluation by Urologist. We have already referred him and he was scheduled, so he should proceed with that apt and keep the current plan with Urologist as next step.  Nobie Putnam, DO [facility] 05/23/2019, 1:16 PM

## 2019-05-23 NOTE — Telephone Encounter (Signed)
Patient has appt scheduled with urology and as per his insurance company, he would have to pay $35 co-pay and cheaper option is to get testosterone shot or androgen therapy. Wanted to see if Dr.K can Rx this and can come in the office for getting shots or androgen therapy which supposed to go in his arm. Advised patient that we don't provide testosterone shot administration but urologist is the best place and he can also ask urology to have less frequent office follow up.

## 2019-05-27 DIAGNOSIS — E291 Testicular hypofunction: Secondary | ICD-10-CM | POA: Diagnosis not present

## 2019-05-27 DIAGNOSIS — N5201 Erectile dysfunction due to arterial insufficiency: Secondary | ICD-10-CM | POA: Diagnosis not present

## 2019-05-27 DIAGNOSIS — N401 Enlarged prostate with lower urinary tract symptoms: Secondary | ICD-10-CM | POA: Diagnosis not present

## 2019-05-28 DIAGNOSIS — E291 Testicular hypofunction: Secondary | ICD-10-CM | POA: Diagnosis not present

## 2019-05-28 DIAGNOSIS — N5201 Erectile dysfunction due to arterial insufficiency: Secondary | ICD-10-CM | POA: Diagnosis not present

## 2019-05-28 DIAGNOSIS — N401 Enlarged prostate with lower urinary tract symptoms: Secondary | ICD-10-CM | POA: Diagnosis not present

## 2019-06-11 DIAGNOSIS — Z1212 Encounter for screening for malignant neoplasm of rectum: Secondary | ICD-10-CM | POA: Diagnosis not present

## 2019-06-11 DIAGNOSIS — Z1211 Encounter for screening for malignant neoplasm of colon: Secondary | ICD-10-CM | POA: Diagnosis not present

## 2019-06-15 ENCOUNTER — Other Ambulatory Visit: Payer: Self-pay | Admitting: Family Medicine

## 2019-06-15 DIAGNOSIS — N138 Other obstructive and reflux uropathy: Secondary | ICD-10-CM

## 2019-06-15 DIAGNOSIS — R350 Frequency of micturition: Secondary | ICD-10-CM

## 2019-06-19 LAB — COLOGUARD
COLOGUARD: NEGATIVE
Cologuard: NEGATIVE

## 2019-06-19 LAB — EXTERNAL GENERIC LAB PROCEDURE: COLOGUARD: NEGATIVE

## 2019-06-21 LAB — COLOGUARD: Cologuard: NEGATIVE

## 2019-06-24 ENCOUNTER — Encounter: Payer: Self-pay | Admitting: Family Medicine

## 2019-06-24 ENCOUNTER — Telehealth: Payer: Self-pay | Admitting: Nurse Practitioner

## 2019-06-24 ENCOUNTER — Ambulatory Visit (INDEPENDENT_AMBULATORY_CARE_PROVIDER_SITE_OTHER): Payer: Medicare HMO | Admitting: Family Medicine

## 2019-06-24 ENCOUNTER — Other Ambulatory Visit: Payer: Self-pay

## 2019-06-24 DIAGNOSIS — K5900 Constipation, unspecified: Secondary | ICD-10-CM

## 2019-06-24 MED ORDER — POLYETHYLENE GLYCOL 3350 17 GM/SCOOP PO POWD: 17.0000 g | Freq: Two times a day (BID) | ORAL | 1 refills | Status: DC | PRN

## 2019-06-24 NOTE — Progress Notes (Signed)
Subjective:    Patient ID: Todd Green, male    DOB: [birth year], 69 y.o.   MRN: [id number]  Todd Green is a 69 y.o. male presenting on 06/24/2019 for Bloated (intermittent constipation. Pt concern for a possible hiatal hernia.)   HPI  Todd Green presents to clinic for evaluation of intermittent constipation.  Has a history of hiatal hernia and is concerned this may be contributing to his symptoms.  Has had chronic intermittent constipation without use of any OTC medications.  Denies bleeding, blood on tissue/stool, external hemorrhoids, straining.  Feels bloated from lack of bowel movements.  Depression screen [facility] 2/9 05/14/2019 08/12/2018 10/31/2017  Decreased Interest 0 0 0  Down, Depressed, Hopeless 0 0 0  PHQ - 2 Score 0 0 0  Altered sleeping - 0 -  Tired, decreased energy - 0 -  Change in appetite - 0 -  Feeling bad or failure about yourself  - 0 -  Trouble concentrating - 0 -  Moving slowly or fidgety/restless - 0 -  Suicidal thoughts - 0 -  PHQ-9 Score - 0 -  Difficult doing work/chores - Not difficult at all -    Social History   Tobacco Use  . Smoking status: Never Smoker  . Smokeless tobacco: Never Used  Substance Use Topics  . Alcohol use: Not Currently  . Drug use: Not Currently    Review of Systems  Constitutional: Negative.   Respiratory: Negative.   Cardiovascular: Negative.   Gastrointestinal: Positive for abdominal distention and constipation. Negative for abdominal pain, anal bleeding, blood in stool, diarrhea, nausea, rectal pain and vomiting.  Genitourinary: Negative.   Musculoskeletal: Negative.   Skin: Negative.   Allergic/Immunologic: Negative.   Neurological: Negative.   Hematological: Negative.   Psychiatric/Behavioral: Negative.    Per HPI unless specifically indicated above     Objective:    BP 125/64 (BP Location: Right Arm)   Pulse 67   Temp (!) 97.3 F (36.3 C) (Oral)   Ht 5\' 9"  (1.753 m)   Wt 181 lb 3.2 oz (82.2 kg)   BMI  26.76 kg/m   Wt Readings from Last 3 Encounters:  06/24/19 181 lb 3.2 oz (82.2 kg)  05/14/19 185 lb (83.9 kg)  03/18/19 179 lb 3.2 oz (81.3 kg)    Physical Exam Vitals reviewed.  Constitutional:      General: He is not in acute distress.    Appearance: Normal appearance. He is well-groomed and overweight. He is not ill-appearing or toxic-appearing.  HENT:     Head: Normocephalic.  Eyes:     General: Lids are normal. Vision grossly intact.        Right eye: No discharge.        Left eye: No discharge.     Extraocular Movements: Extraocular movements intact.     Conjunctiva/sclera: Conjunctivae normal.     Pupils: Pupils are equal, round, and reactive to light.  Cardiovascular:     Rate and Rhythm: Normal rate and regular rhythm.     Pulses: Normal pulses.     Heart sounds: Normal heart sounds. No murmur. No friction rub. No gallop.   Pulmonary:     Effort: Pulmonary effort is normal. No respiratory distress.     Breath sounds: Normal breath sounds.  Abdominal:     General: Abdomen is flat. Bowel sounds are normal. There is no distension.     Palpations: Abdomen is soft. There is no hepatomegaly, splenomegaly or mass.  Tenderness: There is no abdominal tenderness. There is no guarding or rebound.     Hernia: No hernia is present. There is no hernia in the umbilical area, ventral area, left inguinal area, right femoral area, left femoral area or right inguinal area.  Musculoskeletal:        General: Normal range of motion.     Cervical back: Normal range of motion.     Right lower leg: No edema.     Left lower leg: No edema.  Skin:    General: Skin is warm and dry.     Capillary Refill: Capillary refill takes less than 2 seconds.  Neurological:     General: No focal deficit present.     Mental Status: He is alert and oriented to person, place, and time.     Cranial Nerves: No cranial nerve deficit.     Sensory: No sensory deficit.     Motor: No weakness.      Coordination: Coordination normal.     Gait: Gait normal.  Psychiatric:        Attention and Perception: Attention and perception normal.        Mood and Affect: Mood and affect normal.        Speech: Speech normal.        Behavior: Behavior normal. Behavior is cooperative.        Thought Content: Thought content normal.        Cognition and Memory: Cognition and memory normal.        Judgment: Judgment normal.     Results for orders placed or performed in visit on 06/24/19  Cologuard  Result Value Ref Range   Cologuard Negative Negative      Assessment & Plan:   Problem List Items Addressed This Visit      Other   Constipation    Intermittent constipation that has been chronic.  No blood on tissue, external hemorrhoid concerns, changes in diet or water intake.  Has not tried anything OTC for this in the past.  Plan: 1) To increase water intake and exercise activity. 2) Can use a squatty potty to help with positioning for bowel movement 3) Can begin using Mirilax 1-2 cap full daily mixed with a warm beverage.  Discussed can take 3-4 days for bowel movement and can use this daily if has improvement in symptoms and keeps his bowel movements regular. 4) To follow up if no relief with Mirilax or any of the over the counter supplements that we discussed in clinic 5) Call with any questions/concerns/needs      Relevant Medications   polyethylene glycol powder (GLYCOLAX/MIRALAX) 17 GM/SCOOP powder      Meds ordered this encounter  Medications  . polyethylene glycol powder (GLYCOLAX/MIRALAX) 17 GM/SCOOP powder    Sig: Take 17 g by mouth 2 (two) times daily as needed.    Dispense:  3350 g    Refill:  1      Follow up plan: Return if symptoms worsen or fail to improve.   Harlin Rain, [place] [facility] Nurse Practitioner [facility] 06/24/2019, 4:01 PM

## 2019-06-24 NOTE — Patient Instructions (Addendum)
Holistic constipation  Sourced from: Lynnda Child, [facility], CNE, CNC, FNLP  [email or web address]   Common Causes of Constipation:   -Food allergies or sensitivities  -Lactose intolerance  -Leaky gut/damaged mucosal lining of the intestines  -Impaired digestion and absorption  -Prescription drug use  -Lack of "good" bacteria in the gut  -Not enough fiber in the diet   -Dehydration  -Not enough exercise  -Circadian rhythm out of sync or lack of sunshine  -Stress or anxiety  -Magnesium deficient  Lifestyle Treatment for Constipation:   -Increase exercise  -Gentle yogic twist (lay on floor; pull knees toward chest; twist knees to right side and turn head to left. . . reverse)  -Yoga squat (garland pose)- supported on chair, block or blanket is fine  -Acupuncture to stimulate and tone digestive function and motility  -Colon massage (following the line of the colon)   -Colon hydrotherapy or enema  -Consider what you might be 'holding on to' or not releasing  -Can use a squatty potty (These can be found on [employer] or by a Producer, television/film/video)  Food Treatment for Constipation:   -Consume more fiber   -Stay hydrated  -Avoid hydrogenated and processed foods  -Consume probiotic foods  -Eliminate dairy  -Add ground flaxseed or chia seeds   -Soak dried figs or prunes overnight; drink the soak water  -Papaya can stimulate bowel activity  -Look to probiotic and prebiotic-rich foods  -Dandelion root tea ([employer] and other grocery stores)  Supplemental Treatment to Consider for Constipation:   -Probiotics -Magnesium  -Digestive Enzymes  -Aloe Vera Juice (2-4 oz)   -Slippery elm or Marshmallow root  -Psyllium husk   -Vitamin C   Constipation  -Increase fluids and may begin using Miralax 1 cap daily, increasing up to 2 cap full daily mixed with warm beverages such as tea or coffee. -Citrucel.  -Try adding prunes / other dried fruit.  Limit dairy.    -Maintain a high fiber diet with plenty of roughage, and 6-8 large glasses of water daily to avoid constipation in the future.  -Timing elimination to occur after meals, or after a hot drink, can also improve the situation long term.  -1 or 2 Fleet enemas may be necessary.   Can begin using a squatty potty to help with positioning for bowel movement.  You will receive a survey after today's visit either digitally by e-mail or paper by [employer]. Your experiences and feedback matter to [place].  Please respond so we know how we are doing as we provide care for you.  Call us with any questions/concerns/needs.  It is my goal to be available to you for your health concerns.  Thanks for choosing me to be a partner in your healthcare needs!  Harlin Rain, FNP-C Family Nurse Practitioner [facility] Phone: [phone]

## 2019-06-24 NOTE — Telephone Encounter (Signed)
Pt returning call for results

## 2019-06-24 NOTE — Telephone Encounter (Signed)
Left detail message. 

## 2019-06-24 NOTE — Assessment & Plan Note (Signed)
Intermittent constipation that has been chronic.  No blood on tissue, external hemorrhoid concerns, changes in diet or water intake.  Has not tried anything OTC for this in the past.  Plan: 1) To increase water intake and exercise activity. 2) Can use a squatty potty to help with positioning for bowel movement 3) Can begin using Mirilax 1-2 cap full daily mixed with a warm beverage.  Discussed can take 3-4 days for bowel movement and can use this daily if has improvement in symptoms and keeps his bowel movements regular. 4) To follow up if no relief with Mirilax or any of the over the counter supplements that we discussed in clinic 5) Call with any questions/concerns/needs

## 2019-06-25 DIAGNOSIS — N5201 Erectile dysfunction due to arterial insufficiency: Secondary | ICD-10-CM | POA: Diagnosis not present

## 2019-06-25 DIAGNOSIS — E291 Testicular hypofunction: Secondary | ICD-10-CM | POA: Diagnosis not present

## 2019-06-27 DIAGNOSIS — E291 Testicular hypofunction: Secondary | ICD-10-CM | POA: Diagnosis not present

## 2019-08-12 ENCOUNTER — Other Ambulatory Visit: Payer: Self-pay

## 2019-08-12 DIAGNOSIS — E890 Postprocedural hypothyroidism: Secondary | ICD-10-CM

## 2019-08-12 DIAGNOSIS — K449 Diaphragmatic hernia without obstruction or gangrene: Secondary | ICD-10-CM

## 2019-08-12 DIAGNOSIS — R251 Tremor, unspecified: Secondary | ICD-10-CM

## 2019-08-12 DIAGNOSIS — K219 Gastro-esophageal reflux disease without esophagitis: Secondary | ICD-10-CM

## 2019-08-12 NOTE — Telephone Encounter (Signed)
I attempted to contact the patient to confirm what is his pharmacy of choice. We received a refill request from [facility] and we have [facility] as his pharmacy of choice. If the patient calls back please confirm his pharmacy of choice for his maintenance medications(Omeprazole, Levothyroxine, and Propanolol).

## 2019-08-13 NOTE — Telephone Encounter (Signed)
Patient states he is using [employer] now. Updated chart.

## 2019-08-14 MED ORDER — PROPRANOLOL HCL ER 60 MG PO CP24: 60.0000 mg | ORAL_CAPSULE | Freq: Every day | ORAL | 0 refills | Status: DC

## 2019-08-14 MED ORDER — LEVOTHYROXINE SODIUM 137 MCG PO TABS: 137.0000 ug | ORAL_TABLET | Freq: Every day | ORAL | 0 refills | Status: DC

## 2019-08-14 MED ORDER — OMEPRAZOLE 40 MG PO CPDR: DELAYED_RELEASE_CAPSULE | ORAL | 0 refills | Status: DC

## 2019-08-19 ENCOUNTER — Telehealth: Payer: Self-pay | Admitting: Family Medicine

## 2019-08-19 DIAGNOSIS — N138 Other obstructive and reflux uropathy: Secondary | ICD-10-CM

## 2019-08-19 DIAGNOSIS — N401 Enlarged prostate with lower urinary tract symptoms: Secondary | ICD-10-CM

## 2019-08-19 DIAGNOSIS — R350 Frequency of micturition: Secondary | ICD-10-CM

## 2019-08-19 NOTE — Telephone Encounter (Signed)
[place], [place] Phone:  [phone]  Fax:  [phone]      Reason for call:  Pharmacy requesting the following tamsulosin (FLOMAX) 0.4 MG CAPS capsule and fluticasone (FLONASE) 50 MCG/ACT nasal spray. Informed pharmacy please allow 48 to 72 hour turn around time.

## 2019-08-21 ENCOUNTER — Encounter: Payer: Self-pay | Admitting: Family Medicine

## 2019-08-21 ENCOUNTER — Telehealth: Payer: Self-pay

## 2019-08-21 ENCOUNTER — Other Ambulatory Visit: Payer: Self-pay

## 2019-08-21 ENCOUNTER — Ambulatory Visit (INDEPENDENT_AMBULATORY_CARE_PROVIDER_SITE_OTHER): Payer: Medicare HMO | Admitting: Family Medicine

## 2019-08-21 VITALS — BP 106/64 | HR 65 | Temp 97.5°F | Ht 69.0 in | Wt 179.0 lb

## 2019-08-21 DIAGNOSIS — R7309 Other abnormal glucose: Secondary | ICD-10-CM | POA: Diagnosis not present

## 2019-08-21 DIAGNOSIS — Z125 Encounter for screening for malignant neoplasm of prostate: Secondary | ICD-10-CM

## 2019-08-21 DIAGNOSIS — M62838 Other muscle spasm: Secondary | ICD-10-CM | POA: Diagnosis not present

## 2019-08-21 DIAGNOSIS — G250 Essential tremor: Secondary | ICD-10-CM | POA: Diagnosis not present

## 2019-08-21 DIAGNOSIS — R7989 Other specified abnormal findings of blood chemistry: Secondary | ICD-10-CM

## 2019-08-21 DIAGNOSIS — E890 Postprocedural hypothyroidism: Secondary | ICD-10-CM | POA: Diagnosis not present

## 2019-08-21 LAB — POCT GLYCOSYLATED HEMOGLOBIN (HGB A1C): Hemoglobin A1C: 5.2 % (ref 4.0–5.6)

## 2019-08-21 NOTE — Assessment & Plan Note (Signed)
Reports he has been having vivid nightmares and difficulty with sleep.  Believes the propanolol is causing these to recently begin happening.  Has been on propanolol since 10/2018 without neurology evaluation.  Will stop propanolol at this time for essential tremor.  Plan: 1. Will follow up in 1 month and refer to neurology for etiology of tremor and treatment plan if needing additional treatment

## 2019-08-21 NOTE — Patient Instructions (Signed)
As we discussed, stop the propanolol and we can reassess your tremor and nightmares to see if there is any changes.  Call us with any questions/concerns/needs  We will plan to see you back in 1 month for follow up  You will receive a survey after today's visit either digitally by e-mail or paper by [place] mail. Your experiences and feedback matter to [place].  Please respond so we know how we are doing as we provide care for you.  Call us with any questions/concerns/needs.  It is my goal to be available to you for your health concerns.  Thanks for choosing me to be a partner in your healthcare needs!  Harlin Rain, FNP-C Family Nurse Practitioner [facility] Phone: [phone]

## 2019-08-21 NOTE — Telephone Encounter (Signed)
I put in the referral to PT for the [facility] in [place].  Thanks

## 2019-08-21 NOTE — Telephone Encounter (Signed)
Copied from [place] [phone]. Topic: Referral - Question >> Aug 21, 2019  2:57 PM Greggory Keen D wrote: Pt was in today and seen [facility] and discussed with her about the stiffness in his neck.  He wants to do PT at [facility] clinic at [facility] but there needs to be some paper work filled out.  He said the office would need to get the paperwork off the computer and send it back to them.  Their fax is [phone].  CB# [phone]

## 2019-08-21 NOTE — Assessment & Plan Note (Signed)
Labs to be drawn today, previously well controlled thyroid labs.  Previous labs taken 08/20/2018 were within normal limits.  Currently taking synthroid 177mcg daily and tolerating it well.   Plan: 1) Labs ordered today 2) Continue levothyroxine 171mcg daily  3) We will see you back in 1 months

## 2019-08-21 NOTE — Progress Notes (Addendum)
Subjective:    Patient ID: Todd Green, male    DOB: 07-03-1950, 69 y.o.   MRN: [id number]  Todd Green is a 69 y.o. male presenting on 08/21/2019 for Medication Reaction (pt concern that the propanlol could be causing nightmares and difficulty staying asleep.) and Hypothyroidism   HPI  Todd Green presents to clinic for concern of medication reaction to propanolol.  Has been on since 10/2018.  Reports he has been having nightmares recently that are vivid and causing him difficulty with sleep.  Requesting to stop propanolol for his tremor.  Has not been seen by neurology for evaluation for etiology of tremor.  Denies any other changes in medications/foods/sleep hygiene routine.  Todd Green is requesting physical therapy for bilateral trapezius tension.  Reports has been this way for many years, has a hard time relaxing his upper shoulders/neck and would like to have structured physical therapy.     Depression screen [facility] 2/9 05/14/2019 08/12/2018 10/31/2017  Decreased Interest 0 0 0  Down, Depressed, Hopeless 0 0 0  PHQ - 2 Score 0 0 0  Altered sleeping - 0 -  Tired, decreased energy - 0 -  Change in appetite - 0 -  Feeling bad or failure about yourself  - 0 -  Trouble concentrating - 0 -  Moving slowly or fidgety/restless - 0 -  Suicidal thoughts - 0 -  PHQ-9 Score - 0 -  Difficult doing work/chores - Not difficult at all -    Social History   Tobacco Use  . Smoking status: Never Smoker  . Smokeless tobacco: Never Used  Substance Use Topics  . Alcohol use: Not Currently  . Drug use: Not Currently    Review of Systems  Constitutional: Negative.   HENT: Negative.   Eyes: Negative.   Respiratory: Negative.   Cardiovascular: Negative.   Gastrointestinal: Negative.   Endocrine: Negative.   Genitourinary: Negative.   Musculoskeletal: Negative.   Skin: Negative.   Allergic/Immunologic: Negative.   Neurological: Positive for tremors. Negative for dizziness, seizures,  syncope, facial asymmetry, speech difficulty, weakness, light-headedness, numbness and headaches.       Left hand/arm tremor  Hematological: Negative.   Psychiatric/Behavioral: Positive for sleep disturbance. Negative for agitation, behavioral problems, confusion, decreased concentration, dysphoric mood, hallucinations, self-injury and suicidal ideas. The patient is not nervous/anxious and is not hyperactive.    Per HPI unless specifically indicated above     Objective:    BP 106/64 (BP Location: Left Arm, Patient Position: Sitting, Cuff Size: Normal)   Pulse 65   Temp (!) 97.5 F (36.4 C) (Temporal)   Ht 5\' 9"  (1.753 m)   Wt 179 lb (81.2 kg)   BMI 26.43 kg/m   Wt Readings from Last 3 Encounters:  08/21/19 179 lb (81.2 kg)  06/24/19 181 lb 3.2 oz (82.2 kg)  05/14/19 185 lb (83.9 kg)    Physical Exam Vitals reviewed.  Constitutional:      General: He is not in acute distress.    Appearance: Normal appearance. He is well-developed, well-groomed and overweight. He is not ill-appearing or toxic-appearing.  HENT:     Head: Normocephalic.  Eyes:     General: Lids are normal. Vision grossly intact.        Right eye: No discharge.        Left eye: No discharge.     Extraocular Movements: Extraocular movements intact.     Conjunctiva/sclera: Conjunctivae normal.     Pupils: Pupils are equal, round,  and reactive to light.  Neck:     Thyroid: No thyroid mass, thyromegaly or thyroid tenderness.  Cardiovascular:     Rate and Rhythm: Normal rate and regular rhythm.     Pulses: Normal pulses.     Heart sounds: Normal heart sounds. No murmur. No friction rub. No gallop.   Pulmonary:     Effort: Pulmonary effort is normal. No respiratory distress.     Breath sounds: Normal breath sounds.  Musculoskeletal:       Arms:     Comments: Bilateral trapezius tightening with muscle spasms as shown in image above  Skin:    General: Skin is warm and dry.     Capillary Refill: Capillary refill  takes less than 2 seconds.  Neurological:     General: No focal deficit present.     Mental Status: He is alert and oriented to person, place, and time.     Cranial Nerves: Cranial nerves are intact. No cranial nerve deficit.     Sensory: Sensation is intact. No sensory deficit.     Motor: Tremor present. No weakness, atrophy, abnormal muscle tone, seizure activity or pronator drift.     Coordination: Coordination is intact. Romberg sign negative. Coordination normal.     Gait: Gait is intact. Gait normal.     Deep Tendon Reflexes: Reflexes are normal and symmetric.     Comments: Tremor noted primarily to left arm/left hand  Psychiatric:        Attention and Perception: Attention and perception normal.        Mood and Affect: Mood and affect normal.        Speech: Speech normal.        Behavior: Behavior normal. Behavior is cooperative.        Thought Content: Thought content normal.        Cognition and Memory: Cognition and memory normal.        Judgment: Judgment normal.     Results for orders placed or performed in visit on 08/21/19  POCT glycosylated hemoglobin (Hb A1C)  Result Value Ref Range   Hemoglobin A1C 5.2 4.0 - 5.6 %   HbA1c POC (<> result, manual entry)     HbA1c, POC (prediabetic range)     HbA1c, POC (controlled diabetic range)        Assessment & Plan:   Problem List Items Addressed This Visit      Endocrine   Acquired hypothyroidism    Labs to be drawn today, previously well controlled thyroid labs.  Previous labs taken 08/20/2018 were within normal limits.  Currently taking synthroid 153mcg daily and tolerating it well.   Plan: 1) Labs ordered today 2) Continue levothyroxine 165mcg daily  3) We will see you back in 1 months         Nervous and Auditory   Benign essential tremor    Reports he has been having vivid nightmares and difficulty with sleep.  Believes the propanolol is causing these to recently begin happening.  Has been on propanolol  since 10/2018 without neurology evaluation.  Will stop propanolol at this time for essential tremor.  Plan: 1. Will follow up in 1 month and refer to neurology for etiology of tremor and treatment plan if needing additional treatment       Other Visit Diagnoses    Abnormal glucose    -  Primary   Relevant Orders   POCT glycosylated hemoglobin (Hb A1C) (Completed)   CBC with Differential  COMPLETE METABOLIC PANEL WITH GFR   Screening for prostate cancer       Relevant Orders   PSA   Low testosterone       Relevant Orders   Testosterone      No orders of the defined types were placed in this encounter.     Follow up plan: Return in about 4 weeks (around 09/18/2019) for F/U meds.   Harlin Rain, [place] Family Nurse Practitioner [facility] 08/21/2019, 11:26 AM

## 2019-08-21 NOTE — Addendum Note (Signed)
Addended by: Verl Bangs on: 08/21/2019 05:00 PM   Modules accepted: Orders

## 2019-08-21 NOTE — Addendum Note (Signed)
Addended by: Verl Bangs on: 08/21/2019 03:12 PM   Modules accepted: Orders

## 2019-08-22 ENCOUNTER — Telehealth: Payer: Self-pay | Admitting: *Deleted

## 2019-08-22 ENCOUNTER — Other Ambulatory Visit: Payer: Self-pay | Admitting: Family Medicine

## 2019-08-22 DIAGNOSIS — E039 Hypothyroidism, unspecified: Secondary | ICD-10-CM

## 2019-08-22 DIAGNOSIS — Z6826 Body mass index (BMI) 26.0-26.9, adult: Secondary | ICD-10-CM

## 2019-08-22 LAB — CBC WITH DIFFERENTIAL/PLATELET
Absolute Monocytes: 688 cells/uL (ref 200–950)
Basophils Absolute: 19 cells/uL (ref 0–200)
Basophils Relative: 0.3 %
Eosinophils Absolute: 229 cells/uL (ref 15–500)
Eosinophils Relative: 3.7 %
HCT: 43.8 % (ref 38.5–50.0)
Hemoglobin: 14 g/dL (ref 13.2–17.1)
Lymphs Abs: 936 cells/uL (ref 850–3900)
MCH: 30.6 pg (ref 27.0–33.0)
MCHC: 32 g/dL (ref 32.0–36.0)
MCV: 95.8 fL (ref 80.0–100.0)
MPV: 9.6 fL (ref 7.5–12.5)
Monocytes Relative: 11.1 %
Neutro Abs: 4328 cells/uL (ref 1500–7800)
Neutrophils Relative %: 69.8 %
Platelets: 301 10*3/uL (ref 140–400)
RBC: 4.57 10*6/uL (ref 4.20–5.80)
RDW: 12.2 % (ref 11.0–15.0)
Total Lymphocyte: 15.1 %
WBC: 6.2 10*3/uL (ref 3.8–10.8)

## 2019-08-22 LAB — COMPLETE METABOLIC PANEL WITH GFR
AG Ratio: 1.6 (calc) (ref 1.0–2.5)
ALT: 12 U/L (ref 9–46)
AST: 12 U/L (ref 10–35)
Albumin: 3.8 g/dL (ref 3.6–5.1)
Alkaline phosphatase (APISO): 82 U/L (ref 35–144)
BUN: 24 mg/dL (ref 7–25)
CO2: 25 mmol/L (ref 20–32)
Calcium: 8.9 mg/dL (ref 8.6–10.3)
Chloride: 110 mmol/L (ref 98–110)
Creat: 1.07 mg/dL (ref 0.70–1.18)
GFR, Est African American: 81 mL/min/{1.73_m2} (ref >=60)
GFR, Est Non African American: 70 mL/min/{1.73_m2} (ref >=60)
Globulin: 2.4 g/dL (calc) (ref 1.9–3.7)
Glucose, Bld: 88 mg/dL (ref 65–99)
Potassium: 4.4 mmol/L (ref 3.5–5.3)
Sodium: 141 mmol/L (ref 135–146)
Total Bilirubin: 0.4 mg/dL (ref 0.2–1.2)
Total Protein: 6.2 g/dL (ref 6.1–8.1)

## 2019-08-22 LAB — THYROID PANEL WITH TSH
Free Thyroxine Index: 4.2 — ABNORMAL HIGH (ref 1.4–3.8)
T3 Uptake: 36 % — ABNORMAL HIGH (ref 22–35)
T4, Total: 11.6 ug/dL — ABNORMAL HIGH (ref 4.9–10.5)
TSH: 1.5 mIU/L (ref 0.40–4.50)

## 2019-08-22 LAB — PSA: PSA: 2.8 ng/mL (ref <=4.0)

## 2019-08-22 LAB — TESTOSTERONE: Testosterone: 774 ng/dL (ref 250–827)

## 2019-08-22 NOTE — Telephone Encounter (Signed)
Pt given result per [facility], "Labs are within normal limits. His TSH was normal but other thyroid labs were slightly elevated. We can plan to repeat in 2 weeks and have his fasting lipids done at the same time. I'll put orders in now."; he verbalized understanding; pt also scheduled lab appt 09/05/19 at 0800; he gain verbalized understanding; will route to office for notification,

## 2019-08-22 NOTE — Telephone Encounter (Signed)
I called and left a detail message stating that the referral was placed for PT  At [facility] clinic at [facility].

## 2019-08-24 LAB — PSA

## 2019-08-24 LAB — TESTOSTERONE

## 2019-08-24 LAB — COMPLETE METABOLIC PANEL WITH GFR

## 2019-08-24 LAB — CBC WITH DIFFERENTIAL/PLATELET

## 2019-08-24 LAB — THYROID PANEL WITH TSH

## 2019-08-25 ENCOUNTER — Telehealth: Payer: Self-pay

## 2019-08-25 NOTE — Telephone Encounter (Signed)
Copied from [place] [phone]. Topic: General - Other >> Aug 25, 2019  8:17 AM Oneta Rack wrote: Reason for CRM: patient requesting most recent lab please fax to Dr. Eliberto Ivory office prior to tomorrow 1:30p appointment. Dr. Rogers Blocker phone # [phone] and fax # [phone]. Patient would like a follow up call wen done.  Lab faxed over to Dr. Rogers Blocker, attempted to contact the patient to notify him that the labs was faxed over. No answer, left message on the machine.

## 2019-08-25 NOTE — Telephone Encounter (Signed)
Copied from [place] [id number]. Topic: General - Other >> Aug 25, 2019  8:17 AM Oneta Rack wrote: Reason for CRM: patient requesting most recent lab please fax to Dr. Eliberto Ivory office prior to tomorrow 1:30p appointment. Dr. Rogers Blocker phone # [phone] and fax # [phone]. Patient would like a follow up call wen done.   Labs faxed over to Dr. Rogers Blocker office.

## 2019-08-26 DIAGNOSIS — N5201 Erectile dysfunction due to arterial insufficiency: Secondary | ICD-10-CM | POA: Diagnosis not present

## 2019-08-26 DIAGNOSIS — N401 Enlarged prostate with lower urinary tract symptoms: Secondary | ICD-10-CM | POA: Diagnosis not present

## 2019-08-26 DIAGNOSIS — E291 Testicular hypofunction: Secondary | ICD-10-CM | POA: Diagnosis not present

## 2019-08-26 DIAGNOSIS — Z79899 Other long term (current) drug therapy: Secondary | ICD-10-CM | POA: Diagnosis not present

## 2019-08-27 ENCOUNTER — Ambulatory Visit: Payer: Medicare HMO | Admitting: Family Medicine

## 2019-09-05 ENCOUNTER — Other Ambulatory Visit: Payer: Self-pay

## 2019-09-05 ENCOUNTER — Other Ambulatory Visit: Payer: Self-pay | Admitting: Family Medicine

## 2019-09-05 ENCOUNTER — Telehealth: Payer: Self-pay | Admitting: Family Medicine

## 2019-09-05 ENCOUNTER — Other Ambulatory Visit: Payer: Medicare HMO

## 2019-09-05 DIAGNOSIS — Z6826 Body mass index (BMI) 26.0-26.9, adult: Secondary | ICD-10-CM | POA: Diagnosis not present

## 2019-09-05 DIAGNOSIS — J301 Allergic rhinitis due to pollen: Secondary | ICD-10-CM

## 2019-09-05 DIAGNOSIS — E039 Hypothyroidism, unspecified: Secondary | ICD-10-CM | POA: Diagnosis not present

## 2019-09-05 MED ORDER — FLUTICASONE PROPIONATE 50 MCG/ACT NA SUSP: NASAL | 5 refills | Status: DC

## 2019-09-05 NOTE — Telephone Encounter (Signed)
Patient is calling to ask Todd Green should he pick up the levothyroxine (SYNTHROID) 137 MCG tablet [id number] His thyroid was elevated. Patient is wanting to regarding the medication. Or will new medication be ordered? Cb- [phone]

## 2019-09-05 NOTE — Telephone Encounter (Signed)
Pt called requesting a refill on flonase

## 2019-09-05 NOTE — Telephone Encounter (Signed)
Awaiting new lab results from labs that were drawn today.  No results as of yet.

## 2019-09-06 LAB — LIPID PANEL
Cholesterol: 195 mg/dL (ref <200)
HDL: 42 mg/dL (ref >=40)
LDL Cholesterol (Calc): 136 mg/dL (calc) — ABNORMAL HIGH
Non-HDL Cholesterol (Calc): 153 mg/dL (calc) — ABNORMAL HIGH (ref <130)
Total CHOL/HDL Ratio: 4.6 (calc) (ref <5.0)
Triglycerides: 78 mg/dL (ref <150)

## 2019-09-06 LAB — THYROID PANEL WITH TSH
Free Thyroxine Index: 3.6 (ref 1.4–3.8)
T3 Uptake: 35 % (ref 22–35)
T4, Total: 10.3 ug/dL (ref 4.9–10.5)
TSH: 2.89 mIU/L (ref 0.40–4.50)

## 2019-09-08 ENCOUNTER — Telehealth: Payer: Self-pay

## 2019-09-08 ENCOUNTER — Other Ambulatory Visit: Payer: Self-pay | Admitting: Family Medicine

## 2019-09-08 DIAGNOSIS — E890 Postprocedural hypothyroidism: Secondary | ICD-10-CM

## 2019-09-08 MED ORDER — LEVOTHYROXINE SODIUM 137 MCG PO TABS: 137.0000 ug | ORAL_TABLET | Freq: Every day | ORAL | 1 refills | Status: DC

## 2019-09-08 NOTE — Telephone Encounter (Signed)
Please see previous telephone message.

## 2019-09-08 NOTE — Telephone Encounter (Signed)
The patient was notified and verbalize understanding, no questions or concerns.

## 2019-09-08 NOTE — Telephone Encounter (Signed)
Copied from [place] [id number]. Topic: General - Other >> Sep 08, 2019  8:56 AM Leward Quan A wrote: Reason for CRM: Patient would like a call back with results of Thyroid blood work. Ph# [phone]    The pt calling this morning requesting his lab results. He also call last week wanting to know if he should continue on the same dosage of his thyroid medication.

## 2019-09-08 NOTE — Telephone Encounter (Signed)
Attempted to contact the patient with his lab results. No answer, lmom to return call. I will create a CRM to release results.

## 2019-09-08 NOTE — Progress Notes (Signed)
Levothyroxine refill sent to pharmacy on file

## 2019-09-08 NOTE — Telephone Encounter (Signed)
Thyroid labs are normal.  I have sent in a refill on his levothyroxine.  Thanks

## 2019-09-08 NOTE — Telephone Encounter (Signed)
The follow up question about estrogen blocker would be better suited for the provider that is doing his hormone replacement therapy.  Thanks

## 2019-09-08 NOTE — Telephone Encounter (Signed)
Patient notified of lab results. Patient states he has gained some weight and wonders if that can cause changes in his level. He also had follow up question: Patient states he uses testosterone gel and wonders if there is a medication that works as estrogen blocker. Told patient I would forward his question to PCP.

## 2019-09-09 ENCOUNTER — Telehealth: Payer: Self-pay

## 2019-09-09 ENCOUNTER — Other Ambulatory Visit: Payer: Self-pay | Admitting: Family Medicine

## 2019-09-09 DIAGNOSIS — J301 Allergic rhinitis due to pollen: Secondary | ICD-10-CM

## 2019-09-09 DIAGNOSIS — M6281 Muscle weakness (generalized): Secondary | ICD-10-CM | POA: Diagnosis not present

## 2019-09-09 DIAGNOSIS — M542 Cervicalgia: Secondary | ICD-10-CM | POA: Diagnosis not present

## 2019-09-09 MED ORDER — FLUTICASONE PROPIONATE 50 MCG/ACT NA SUSP: NASAL | 5 refills | Status: DC
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
## 2019-09-09 NOTE — Telephone Encounter (Signed)
fluticasone (FLONASE) 50 MCG/ACT nasal spray   Patient would like this medication transferred to mail pharmacy, as it is cheaper.    Pharmacy:  [place], [place] Phone:  [phone]  Fax:  [phone]

## 2019-09-09 NOTE — Telephone Encounter (Signed)
Change of pharmacy Requested Prescriptions  Pending Prescriptions Disp Refills  . fluticasone (FLONASE) 50 MCG/ACT nasal spray 18 g 5    Sig: 2 sprays by Each Nare route daily.     Ear, Nose, and Throat: Nasal Preparations - Corticosteroids Passed - 09/09/2019  2:50 PM      Passed - Valid encounter within last 12 months    Recent Outpatient Visits          2 weeks ago Abnormal glucose   [name], Lupita Raider, FNP   2 months ago Constipation, unspecified constipation type   [place], FNP   3 months ago Laceration of nose, initial encounter   [place], DO   5 months ago Urinary frequency   [place], DO   7 months ago Encounter for annual physical exam   [facility] Merrilyn Puma, Jerrel Ivory, NP

## 2019-09-10 ENCOUNTER — Telehealth: Payer: Self-pay

## 2019-09-10 NOTE — Telephone Encounter (Signed)
I sent a message to [facility] and requested the referral be updated and faxed to [facility].  Thanks

## 2019-09-10 NOTE — Telephone Encounter (Signed)
Copied from [place] [phone]. Topic: General - Inquiry >> Sep 09, 2019 11:14 AM Mathis Bud wrote: Reason for CRM: [facility] called requesting office to fax over referral for patient FAX [phone] >> Sep 10, 2019  8:15 AM Jodie Echevaria wrote: Anderson Malta with [facility] called to request that the  office to fax over referral for patient. He was seen on 09/09/19 and this referral is needed for billing and insurance purposes FAX [phone]

## 2019-09-10 NOTE — Telephone Encounter (Signed)
Are we able to update his physical therapy referral request to go to [facility] in [place]?  Please and Thank you

## 2019-09-10 NOTE — Telephone Encounter (Signed)
Medical record request completed, record fee form faxed to [place] and Geraldo Docker to the attention of [facility], records faxed. Copy placed in scan.

## 2019-09-15 ENCOUNTER — Other Ambulatory Visit: Payer: Self-pay | Admitting: Family Medicine

## 2019-09-15 DIAGNOSIS — R350 Frequency of micturition: Secondary | ICD-10-CM

## 2019-09-15 DIAGNOSIS — N138 Other obstructive and reflux uropathy: Secondary | ICD-10-CM

## 2019-09-15 DIAGNOSIS — N401 Enlarged prostate with lower urinary tract symptoms: Secondary | ICD-10-CM

## 2019-09-15 NOTE — Telephone Encounter (Signed)
Requested Prescriptions  Pending Prescriptions Disp Refills  . tamsulosin (FLOMAX) 0.4 MG CAPS capsule [Pharmacy Med Name: TAMSULOSIN 0.4MG  CAPSULES] 30 capsule 2    Sig: TAKE 1 CAPSULE(0.4 MG) BY MOUTH DAILY     Urology: Alpha-Adrenergic Blocker Passed - 09/15/2019  8:12 AM      Passed - Last BP in normal range    BP Readings from Last 1 Encounters:  08/21/19 106/64         Passed - Valid encounter within last 12 months    Recent Outpatient Visits          3 weeks ago Abnormal glucose   [facility], Lupita Raider, FNP   2 months ago Constipation, unspecified constipation type   [facility], Lupita Raider, FNP   4 months ago Laceration of nose, initial encounter   [place], DO   6 months ago Urinary frequency   [place], DO   7 months ago Encounter for annual physical exam   [facility] Merrilyn Puma, Jerrel Ivory, NP      Future Appointments            In 1 week [facility], [facility]

## 2019-09-16 DIAGNOSIS — M6281 Muscle weakness (generalized): Secondary | ICD-10-CM | POA: Diagnosis not present

## 2019-09-16 DIAGNOSIS — M542 Cervicalgia: Secondary | ICD-10-CM | POA: Diagnosis not present

## 2019-09-23 ENCOUNTER — Ambulatory Visit (INDEPENDENT_AMBULATORY_CARE_PROVIDER_SITE_OTHER): Payer: Medicare HMO

## 2019-09-23 ENCOUNTER — Other Ambulatory Visit: Payer: Self-pay

## 2019-09-23 VITALS — BP 129/85 | HR 68 | Temp 97.5°F | Ht 69.0 in | Wt 172.0 lb

## 2019-09-23 DIAGNOSIS — Z0000 Encounter for general adult medical examination without abnormal findings: Secondary | ICD-10-CM

## 2019-09-23 NOTE — Patient Instructions (Addendum)
Todd Green , Thank you for taking time to come for your Medicare Wellness Visit. I appreciate your ongoing commitment to your health goals. Please review the following plan we discussed and let me know if I can assist you in the future.   Screening recommendations/referrals: Colonoscopy: cologuard completed 2021. Due 2024 Recommended yearly ophthalmology/optometry visit for glaucoma screening and checkup Recommended yearly dental visit for hygiene and checkup  Vaccinations: Influenza vaccine: due 12/2019 Pneumococcal vaccine: completed series  Tdap vaccine: up to date  Shingles vaccine: shingrix eligible    Covid-19: completed   Advanced directives: Advance directive discussed with you today. Even though you declined this today please call our office should you change your mind and we can give you the proper paperwork for you to fill out.  Conditions/risks identified: continue exercising at least 3x a week.   Next appointment: Follow up in one year for your annual wellness visit.   Preventive Care 46 Years and Older, Male Preventive care refers to lifestyle choices and visits with your health care provider that can promote health and wellness. What does preventive care include?  A yearly physical exam. This is also called an annual well check.  Dental exams once or twice a year.  Routine eye exams. Ask your health care provider how often you should have your eyes checked.  Personal lifestyle choices, including:  Daily care of your teeth and gums.  Regular physical activity.  Eating a healthy diet.  Avoiding tobacco and drug use.  Limiting alcohol use.  Practicing safe sex.  Taking low doses of aspirin every day.  Taking vitamin and mineral supplements as recommended by your health care provider. What happens during an annual well check? The services and screenings done by your health care provider during your annual well check will depend on your age, overall health,  lifestyle risk factors, and family history of disease. Counseling  Your health care provider may ask you questions about your:  Alcohol use.  Tobacco use.  Drug use.  Emotional well-being.  Home and relationship well-being.  Sexual activity.  Eating habits.  History of falls.  Memory and ability to understand (cognition).  Work and work Statistician. Screening  You may have the following tests or measurements:  Height, weight, and BMI.  Blood pressure.  Lipid and cholesterol levels. These may be checked every 5 years, or more frequently if you are over 56 years old.  Skin check.  Lung cancer screening. You may have this screening every year starting at age 68 if you have a 30-pack-year history of smoking and currently smoke or have quit within the past 15 years.  Fecal occult blood test (FOBT) of the stool. You may have this test every year starting at age 56.  Flexible sigmoidoscopy or colonoscopy. You may have a sigmoidoscopy every 5 years or a colonoscopy every 10 years starting at age 80.  Prostate cancer screening. Recommendations will vary depending on your family history and other risks.  Hepatitis C blood test.  Hepatitis B blood test.  Sexually transmitted disease (STD) testing.  Diabetes screening. This is done by checking your blood sugar (glucose) after you have not eaten for a while (fasting). You may have this done every 1-3 years.  Abdominal aortic aneurysm (AAA) screening. You may need this if you are a current or former smoker.  Osteoporosis. You may be screened starting at age 59 if you are at high risk. Talk with your health care provider about your test results, treatment  options, and if necessary, the need for more tests. Vaccines  Your health care provider may recommend certain vaccines, such as:  Influenza vaccine. This is recommended every year.  Tetanus, diphtheria, and acellular pertussis (Tdap, Td) vaccine. You may need a Td booster  every 10 years.  Zoster vaccine. You may need this after age 90.  Pneumococcal 13-valent conjugate (PCV13) vaccine. One dose is recommended after age 12.  Pneumococcal polysaccharide (PPSV23) vaccine. One dose is recommended after age 50. Talk to your health care provider about which screenings and vaccines you need and how often you need them. This information is not intended to replace advice given to you by your health care provider. Make sure you discuss any questions you have with your health care provider. Document Released: 05/07/2015 Document Revised: 12/29/2015 Document Reviewed: 02/09/2015 Elsevier Interactive Patient Education  2017 [place] Prevention in the Home Falls can cause injuries. They can happen to people of all ages. There are many things you can do to make your home safe and to help prevent falls. What can I do on the outside of my home?  Regularly fix the edges of walkways and driveways and fix any cracks.  Remove anything that might make you trip as you walk through a door, such as a raised step or threshold.  Trim any bushes or trees on the path to your home.  Use bright outdoor lighting.  Clear any walking paths of anything that might make someone trip, such as rocks or tools.  Regularly check to see if handrails are loose or broken. Make sure that both sides of any steps have handrails.  Any raised decks and porches should have guardrails on the edges.  Have any leaves, snow, or ice cleared regularly.  Use sand or salt on walking paths during winter.  Clean up any spills in your garage right away. This includes oil or grease spills. What can I do in the bathroom?  Use night lights.  Install grab bars by the toilet and in the tub and shower. Do not use towel bars as grab bars.  Use non-skid mats or decals in the tub or shower.  If you need to sit down in the shower, use a plastic, non-slip stool.  Keep the floor dry. Clean up any  water that spills on the floor as soon as it happens.  Remove soap buildup in the tub or shower regularly.  Attach bath mats securely with double-sided non-slip rug tape.  Do not have throw rugs and other things on the floor that can make you trip. What can I do in the bedroom?  Use night lights.  Make sure that you have a light by your bed that is easy to reach.  Do not use any sheets or blankets that are too big for your bed. They should not hang down onto the floor.  Have a firm chair that has side arms. You can use this for support while you get dressed.  Do not have throw rugs and other things on the floor that can make you trip. What can I do in the kitchen?  Clean up any spills right away.  Avoid walking on wet floors.  Keep items that you use a lot in easy-to-reach places.  If you need to reach something above you, use a strong step stool that has a grab bar.  Keep electrical cords out of the way.  Do not use floor polish or wax that makes floors slippery.  If you must use wax, use non-skid floor wax.  Do not have throw rugs and other things on the floor that can make you trip. What can I do with my stairs?  Do not leave any items on the stairs.  Make sure that there are handrails on both sides of the stairs and use them. Fix handrails that are broken or loose. Make sure that handrails are as long as the stairways.  Check any carpeting to make sure that it is firmly attached to the stairs. Fix any carpet that is loose or worn.  Avoid having throw rugs at the top or bottom of the stairs. If you do have throw rugs, attach them to the floor with carpet tape.  Make sure that you have a light switch at the top of the stairs and the bottom of the stairs. If you do not have them, ask someone to add them for you. What else can I do to help prevent falls?  Wear shoes that:  Do not have high heels.  Have rubber bottoms.  Are comfortable and fit you well.  Are closed  at the toe. Do not wear sandals.  If you use a stepladder:  Make sure that it is fully opened. Do not climb a closed stepladder.  Make sure that both sides of the stepladder are locked into place.  Ask someone to hold it for you, if possible.  Clearly mark and make sure that you can see:  Any grab bars or handrails.  First and last steps.  Where the edge of each step is.  Use tools that help you move around (mobility aids) if they are needed. These include:  Canes.  Walkers.  Scooters.  Crutches.  Turn on the lights when you go into a dark area. Replace any light bulbs as soon as they burn out.  Set up your furniture so you have a clear path. Avoid moving your furniture around.  If any of your floors are uneven, fix them.  If there are any pets around you, be aware of where they are.  Review your medicines with your doctor. Some medicines can make you feel dizzy. This can increase your chance of falling. Ask your doctor what other things that you can do to help prevent falls. This information is not intended to replace advice given to you by your health care provider. Make sure you discuss any questions you have with your health care provider. Document Released: 02/04/2009 Document Revised: 09/16/2015 Document Reviewed: 05/15/2014 Elsevier Interactive Patient Education  2017 [employer].
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
## 2019-09-30 DIAGNOSIS — M542 Cervicalgia: Secondary | ICD-10-CM | POA: Diagnosis not present

## 2019-09-30 DIAGNOSIS — M6281 Muscle weakness (generalized): Secondary | ICD-10-CM | POA: Diagnosis not present

## 2019-10-10 ENCOUNTER — Other Ambulatory Visit: Payer: Self-pay | Admitting: Family Medicine

## 2019-10-10 DIAGNOSIS — K219 Gastro-esophageal reflux disease without esophagitis: Secondary | ICD-10-CM

## 2019-10-10 NOTE — Telephone Encounter (Signed)
Requested Prescriptions  Pending Prescriptions Disp Refills   propranolol ER (INDERAL LA) 60 MG 24 hr capsule [Pharmacy Med Name: PROPRANOLOL HYDROCHLORIDEER 60 MG Capsule Extended Release 24 Hour] 90 capsule     Sig: TAKE 1 CAPSULE (60 MG TOTAL) BY MOUTH DAILY (NEED MD APPOINTMENT)     Cardiovascular:  Beta Blockers Passed - 10/10/2019  3:26 PM      Passed - Last BP in normal range    BP Readings from Last 1 Encounters:  09/23/19 129/85         Passed - Last Heart Rate in normal range    Pulse Readings from Last 1 Encounters:  09/23/19 68         Passed - Valid encounter within last 6 months    Recent Outpatient Visits          1 month ago Abnormal glucose   [facility], Lupita Raider, FNP   3 months ago Constipation, unspecified constipation type   [facility], Lupita Raider, FNP   4 months ago Laceration of nose, initial encounter   [place], DO   6 months ago Urinary frequency   [place], DO   8 months ago Encounter for annual physical exam   [facility] Merrilyn Puma, Jerrel Ivory, NP      Future Appointments            In 6 months [name], [place]             omeprazole (PRILOSEC) 40 MG capsule [Pharmacy Med Name: OMEPRAZOLE 40 MG Capsule Delayed Release] 90 capsule 0    Sig: TAKE 1 CAPSULE EVERY DAY     Gastroenterology: Proton Pump Inhibitors Passed - 10/10/2019  3:26 PM      Passed - Valid encounter within last 12 months    Recent Outpatient Visits          1 month ago Abnormal glucose   [facility], Lupita Raider, FNP   3 months ago Constipation, unspecified constipation type   [facility], Lupita Raider, FNP   4 months ago Laceration of nose, initial encounter   [place], DO   6 months ago Urinary frequency   [place], DO   8 months ago Encounter for annual physical exam   [name] Merrilyn Puma, Jerrel Ivory, NP      Future Appointments            In 9 months [facility], [facility]

## 2019-10-14 NOTE — Telephone Encounter (Signed)
Pharmacy called stating that the pt is requesting to have his propanolol refilled. Pharmacist states that the pt is upset because he has not been able to receive his medication and they are requesting to have the pt called back with the reason he is not able to have medication refilled. Please advise.

## 2019-10-14 NOTE — Telephone Encounter (Signed)
Reviewed last office visit (April 2021).  Patient requested to stop propanolol due to vivid nightmares and difficulty with sleep.  Discussed with him at that visit he had not had a neurology evaluation for etiology of the tremor.  Was to follow up in 1 month (end of May 2021) to re-evaluate his tremor and send a neurology referral if was still present.  Does he want to restart on this medication?  I can resend, but would need to have an office visit within the next week to re-evaluate him.

## 2019-10-15 NOTE — Telephone Encounter (Signed)
The patient was notified of provider recommendation. Appt scheduled for Thursday, June 24th at 8:20am.

## 2019-10-16 ENCOUNTER — Ambulatory Visit (INDEPENDENT_AMBULATORY_CARE_PROVIDER_SITE_OTHER): Payer: Medicare HMO | Admitting: Family Medicine

## 2019-10-16 ENCOUNTER — Encounter: Payer: Self-pay | Admitting: Family Medicine

## 2019-10-16 ENCOUNTER — Other Ambulatory Visit: Payer: Self-pay

## 2019-10-16 VITALS — BP 114/68 | HR 69 | Temp 97.3°F | Ht 69.0 in | Wt 175.6 lb

## 2019-10-16 DIAGNOSIS — G250 Essential tremor: Secondary | ICD-10-CM | POA: Diagnosis not present

## 2019-10-16 MED ORDER — PROPRANOLOL HCL ER 60 MG PO CP24: 60.0000 mg | ORAL_CAPSULE | Freq: Every day | ORAL | 1 refills | Status: DC

## 2019-10-16 NOTE — Assessment & Plan Note (Signed)
Requesting to restart propanolol for diagnosed benign essential tremor from 10/31/2017 with previous PCP with [facility] in [place], [place].  Reports that right hand tremor ceases at rest or with exertion.  Reviewed record with patient in clinic, although is his left hand that has the tremor, patient states he has not been to that office and they must have the wrong person's medical records in his chart.  Discussed with patient that his left hand tremor is still present at rest, and would be beneficial to have a neurology evaluation for etiology of tremor.  Patient declines and states would like to restart the propanolol and if his symptoms worsen, he would then meet with neurology.  Plan: 1. Referral to Neurology, offered and declined 2. Restart propanolol at 60mg  daily, as previously prescribed 3. Follow up in 3 months

## 2019-10-16 NOTE — Progress Notes (Signed)
Subjective:    Patient ID: Todd Green, male    DOB: 16-Dec-1950, 69 y.o.   MRN: [id number]  Nathanal Green is a 69 y.o. male presenting on 10/16/2019 for Tremors (pt state today he doesn't know why he's here today. )   HPI  [name] presents to clinic today for discussion on his propanolol prescription.  Reports at his last visit he requested to stop the propanolol due to vivid nightmares, but his tremor has increased and is requesting to restart this medication.    Depression screen [facility] 2/9 09/23/2019 05/14/2019 08/12/2018  Decreased Interest 0 0 0  Down, Depressed, Hopeless 0 0 0  PHQ - 2 Score 0 0 0  Altered sleeping - - 0  Tired, decreased energy - - 0  Change in appetite - - 0  Feeling bad or failure about yourself  - - 0  Trouble concentrating - - 0  Moving slowly or fidgety/restless - - 0  Suicidal thoughts - - 0  PHQ-9 Score - - 0  Difficult doing work/chores - - Not difficult at all    Social History   Tobacco Use  . Smoking status: Never Smoker  . Smokeless tobacco: Never Used  Vaping Use  . Vaping Use: Never used  Substance Use Topics  . Alcohol use: Not Currently  . Drug use: Not Currently    Review of Systems  Constitutional: Negative.   HENT: Negative.   Eyes: Negative.   Respiratory: Negative.   Cardiovascular: Negative.   Gastrointestinal: Negative.   Endocrine: Negative.   Genitourinary: Negative.   Musculoskeletal: Negative.   Skin: Negative.   Allergic/Immunologic: Negative.   Neurological: Positive for tremors. Negative for dizziness, seizures, syncope, facial asymmetry, speech difficulty, weakness, light-headedness, numbness and headaches.  Hematological: Negative.   Psychiatric/Behavioral: Negative.    Per HPI unless specifically indicated above     Objective:    BP 114/68 (BP Location: Left Arm, Patient Position: Sitting, Cuff Size: Normal)   Pulse 69   Temp (!) 97.3 F (36.3 C) (Temporal)   Ht 5\' 9"  (1.753 m)   Wt 175 lb 9.6 oz  (79.7 kg)   BMI 25.93 kg/m   Wt Readings from Last 3 Encounters:  10/16/19 175 lb 9.6 oz (79.7 kg)  09/23/19 172 lb (78 kg)  08/21/19 179 lb (81.2 kg)    Physical Exam Vitals reviewed.  Constitutional:      General: He is not in acute distress.    Appearance: Normal appearance. He is well-developed, well-groomed and overweight. He is not ill-appearing or toxic-appearing.  HENT:     Head: Normocephalic and atraumatic.     Nose:     Comments: [name] is in place, covering mouth and nose. Eyes:     General:        Right eye: No discharge.        Left eye: No discharge.     Extraocular Movements: Extraocular movements intact.     Conjunctiva/sclera: Conjunctivae normal.     Pupils: Pupils are equal, round, and reactive to light.  Cardiovascular:     Pulses: Normal pulses.  Pulmonary:     Effort: Pulmonary effort is normal. No respiratory distress.  Musculoskeletal:     Right lower leg: No edema.     Left lower leg: No edema.  Skin:    General: Skin is warm and dry.     Capillary Refill: Capillary refill takes less than 2 seconds.  Neurological:     General: No  focal deficit present.     Mental Status: He is alert and oriented to person, place, and time.     GCS: GCS eye subscore is 4. GCS verbal subscore is 5. GCS motor subscore is 6.     Cranial Nerves: No cranial nerve deficit.     Sensory: No sensory deficit.     Motor: Tremor present. No weakness, atrophy, abnormal muscle tone or seizure activity.     Gait: Gait normal.  Psychiatric:        Attention and Perception: Attention and perception normal.        Mood and Affect: Mood and affect normal.        Speech: Speech normal.        Behavior: Behavior normal. Behavior is cooperative.        Thought Content: Thought content normal.        Cognition and Memory: Cognition and memory normal.    Results for orders placed or performed in visit on 08/22/19  Lipid Profile  Result Value Ref Range   Cholesterol 195 <200  mg/dL   HDL 42 > OR = 40 mg/dL   Triglycerides 78 <150 mg/dL   LDL Cholesterol (Calc) 136 (H) mg/dL (calc)   Total CHOL/HDL Ratio 4.6 <5.0 (calc)   Non-HDL Cholesterol (Calc) 153 (H) <130 mg/dL (calc)  Thyroid Panel With TSH  Result Value Ref Range   T3 Uptake 35 22 - 35 %   T4, Total 10.3 4.9 - 10.5 mcg/dL   Free Thyroxine Index 3.6 1.4 - 3.8   TSH 2.89 0.40 - 4.50 mIU/L      Assessment & Plan:   Problem List Items Addressed This Visit      Nervous and Auditory   Benign essential tremor - Primary    Requesting to restart propanolol for diagnosed benign essential tremor from 10/31/2017 with previous PCP with [facility] in [place], [place].  Reports that right hand tremor ceases at rest or with exertion.  Reviewed record with patient in clinic, although is his left hand that has the tremor, patient states he has not been to that office and they must have the wrong person's medical records in his chart.  Discussed with patient that his left hand tremor is still present at rest, and would be beneficial to have a neurology evaluation for etiology of tremor.  Patient declines and states would like to restart the propanolol and if his symptoms worsen, he would then meet with neurology.  Plan: 1. Referral to Neurology, offered and declined 2. Restart propanolol at 60mg  daily, as previously prescribed 3. Follow up in 3 months      Relevant Medications   propranolol ER (INDERAL LA) 60 MG 24 hr capsule      Meds ordered this encounter  Medications  . propranolol ER (INDERAL LA) 60 MG 24 hr capsule    Sig: Take 1 capsule (60 mg total) by mouth daily.    Dispense:  180 capsule    Refill:  1      Follow up plan: Return in about 3 months (around 01/16/2020) for Tremor follow up visit.   Harlin Rain, [place] [facility] Nurse Practitioner [facility] 10/16/2019, 8:43 AM

## 2019-10-16 NOTE — Patient Instructions (Signed)
As we discussed, I encourage you to have a neurology evaluation for etiology of your left hand/arm tremor.  Please think about this and we can plan to discuss again at your next visit in 3 months.  Continue all other medications as prescribed.  We will plan to see you back in 3 months for tremor follow up  You will receive a survey after today's visit either digitally by e-mail or paper by [facility] mail. Your experiences and feedback matter to [place].  Please respond so we know how we are doing as we provide care for you.  Call us with any questions/concerns/needs.  It is my goal to be available to you for your health concerns.  Thanks for choosing me to be a partner in your healthcare needs!  Harlin Rain, FNP-C Family Nurse Practitioner [facility] Phone: [phone]

## 2020-02-05 ENCOUNTER — Other Ambulatory Visit: Payer: Self-pay

## 2020-02-05 ENCOUNTER — Ambulatory Visit (INDEPENDENT_AMBULATORY_CARE_PROVIDER_SITE_OTHER): Payer: Medicare HMO

## 2020-02-05 DIAGNOSIS — Z23 Encounter for immunization: Secondary | ICD-10-CM

## 2020-02-11 ENCOUNTER — Other Ambulatory Visit: Payer: Self-pay | Admitting: Family Medicine

## 2020-02-11 DIAGNOSIS — K219 Gastro-esophageal reflux disease without esophagitis: Secondary | ICD-10-CM

## 2020-02-11 DIAGNOSIS — E890 Postprocedural hypothyroidism: Secondary | ICD-10-CM

## 2020-03-03 DIAGNOSIS — N401 Enlarged prostate with lower urinary tract symptoms: Secondary | ICD-10-CM | POA: Diagnosis not present

## 2020-03-03 DIAGNOSIS — Z79899 Other long term (current) drug therapy: Secondary | ICD-10-CM | POA: Diagnosis not present

## 2020-03-03 DIAGNOSIS — E291 Testicular hypofunction: Secondary | ICD-10-CM | POA: Diagnosis not present

## 2020-03-08 ENCOUNTER — Other Ambulatory Visit: Payer: Self-pay

## 2020-03-08 ENCOUNTER — Encounter: Payer: Self-pay | Admitting: Family Medicine

## 2020-03-08 ENCOUNTER — Ambulatory Visit (INDEPENDENT_AMBULATORY_CARE_PROVIDER_SITE_OTHER): Payer: Medicare HMO | Admitting: Family Medicine

## 2020-03-08 VITALS — Ht 69.0 in

## 2020-03-08 DIAGNOSIS — J302 Other seasonal allergic rhinitis: Secondary | ICD-10-CM | POA: Insufficient documentation

## 2020-03-08 MED ORDER — CETIRIZINE HCL 10 MG PO TABS: 10.0000 mg | ORAL_TABLET | Freq: Every day | ORAL | 1 refills | Status: DC

## 2020-03-08 MED ORDER — AZELASTINE HCL 0.1 % NA SOLN: 2.0000 | Freq: Two times a day (BID) | NASAL | 1 refills | Status: DC
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
## 2020-03-08 NOTE — Assessment & Plan Note (Signed)
Seasonal allergies based on symptoms and history.  Has been taking afrin > 3 days with some rebound nasal congestion, advised to stop the afrin.  Will send in rx for zyrtec 10mg  daily along with azelastine nasal spray.  Encouraged to take allergy medication with seasonal changes due to history of seasonal allergies.  Plan: 1. Begin zyrtec 10mg  daily 2. Begin azelastine 2 sprays in each nostril 2x daily to help with nasal congestion 3. RTC PRN

## 2020-03-10 ENCOUNTER — Other Ambulatory Visit: Payer: Self-pay

## 2020-03-10 ENCOUNTER — Encounter: Payer: Self-pay | Admitting: Family Medicine

## 2020-03-10 ENCOUNTER — Telehealth: Payer: Self-pay

## 2020-03-10 ENCOUNTER — Ambulatory Visit (INDEPENDENT_AMBULATORY_CARE_PROVIDER_SITE_OTHER): Payer: Medicare HMO | Admitting: Family Medicine

## 2020-03-10 DIAGNOSIS — R0981 Nasal congestion: Secondary | ICD-10-CM

## 2020-03-10 NOTE — Telephone Encounter (Signed)
Copied from [place] [phone]. Topic: General - Inquiry >> Mar 09, 2020  4:40 PM Greggory Keen D wrote: Reason for CRM: Pt called back today saying he talked to [facility] yesterday about his allergies.  He said now he feels like it is going into his chest .  He would like an antibiotic  Meriel Pica  CB#  [phone]
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
## 2020-03-10 NOTE — Assessment & Plan Note (Signed)
Rebound nasal congestion, likely from recent discontinuation of afrin that was used for prolonged period of time.  Encouraged to continue with azelastine nasal spray and cetirizine to help with clearing of secretions.  Encouraged COVID testing as well due to continued nasal congestion and rhinorrhea.

## 2020-03-16 ENCOUNTER — Other Ambulatory Visit: Payer: Self-pay | Admitting: Family Medicine

## 2020-03-16 DIAGNOSIS — N138 Other obstructive and reflux uropathy: Secondary | ICD-10-CM

## 2020-03-16 DIAGNOSIS — R350 Frequency of micturition: Secondary | ICD-10-CM

## 2020-03-26 ENCOUNTER — Other Ambulatory Visit: Payer: Self-pay

## 2020-03-26 DIAGNOSIS — R350 Frequency of micturition: Secondary | ICD-10-CM

## 2020-03-26 DIAGNOSIS — N138 Other obstructive and reflux uropathy: Secondary | ICD-10-CM

## 2020-03-26 MED ORDER — TAMSULOSIN HCL 0.4 MG PO CAPS: 0.4000 mg | ORAL_CAPSULE | Freq: Every day | ORAL | 1 refills | Status: DC

## 2020-04-13 ENCOUNTER — Other Ambulatory Visit: Payer: Self-pay

## 2020-04-13 ENCOUNTER — Ambulatory Visit (INDEPENDENT_AMBULATORY_CARE_PROVIDER_SITE_OTHER): Payer: Medicare HMO | Admitting: Family Medicine

## 2020-04-13 ENCOUNTER — Encounter: Payer: Self-pay | Admitting: Family Medicine

## 2020-04-13 VITALS — BP 131/74 | HR 73 | Temp 97.8°F | Resp 18 | Ht 69.0 in | Wt 176.8 lb

## 2020-04-13 DIAGNOSIS — G250 Essential tremor: Secondary | ICD-10-CM

## 2020-04-13 DIAGNOSIS — L309 Dermatitis, unspecified: Secondary | ICD-10-CM | POA: Diagnosis not present

## 2020-04-13 MED ORDER — EUCRISA 2 % EX OINT: 1.0000 "application " | TOPICAL_OINTMENT | Freq: Every day | CUTANEOUS | 1 refills | Status: DC

## 2020-04-13 NOTE — Progress Notes (Signed)
Subjective:    Patient ID: Todd Green, male    DOB: 1950/11/28, 69 y.o.   MRN: [id number]  Todd Green is a 69 y.o. male presenting on 04/13/2020 for Medication Problem (Pt complains of ED, nightmares, hallucination  and fatigue due to the side effects of Propanolol.. The pt discontinued the medication x 2 days ago and notice some improvement with his fatigue) and Rash (Pt complains of a rash w/ redness, itching around his mustache x 2 )   HPI  [name] presents to clinic for concerns of stopping his propanolol due to nightmares and erectile dysfunction.  Reports has stopped this medication a few days ago with improvement in his concerns.  Has been taking propanolol since 10/2017 when he was with his previous PCP for previously diagnosed benign essential tremor.  Has not met with Neurology for this tremor.  Reports he has concerns for rash with redness x 2 days around his mustache.  Is requesting topical ointment/cream to help.  Has not taken anything OTC to help with symptoms.  Depression screen [name] 2/9 03/08/2020 09/23/2019 05/14/2019  Decreased Interest 0 0 0  Down, Depressed, Hopeless 0 0 0  PHQ - 2 Score 0 0 0  Altered sleeping 0 - -  Tired, decreased energy 0 - -  Change in appetite 0 - -  Feeling bad or failure about yourself  0 - -  Trouble concentrating 0 - -  Moving slowly or fidgety/restless 0 - -  Suicidal thoughts 0 - -  PHQ-9 Score 0 - -  Difficult doing work/chores - - -    Social History   Tobacco Use  . Smoking status: Never Smoker  . Smokeless tobacco: Never Used  Vaping Use  . Vaping Use: Never used  Substance Use Topics  . Alcohol use: Not Currently  . Drug use: Not Currently    Review of Systems  Constitutional: Negative.   HENT: Negative.   Eyes: Negative.   Respiratory: Negative.   Cardiovascular: Negative.   Gastrointestinal: Negative.   Endocrine: Negative.   Genitourinary: Negative.   Musculoskeletal: Negative.   Skin: Positive for  rash. Negative for color change, pallor and wound.  Allergic/Immunologic: Negative.   Neurological: Positive for tremors. Negative for dizziness, seizures, syncope, facial asymmetry, speech difficulty, weakness, light-headedness, numbness and headaches.  Hematological: Negative.   Psychiatric/Behavioral: Positive for sleep disturbance. Negative for agitation, behavioral problems, confusion, decreased concentration, dysphoric mood, hallucinations, self-injury and suicidal ideas. The patient is not nervous/anxious and is not hyperactive.    Per HPI unless specifically indicated above     Objective:    BP 131/74 (BP Location: Left Arm, Patient Position: Sitting, Cuff Size: Normal)   Pulse 73   Temp 97.8 F (36.6 C) (Temporal)   Resp 18   Ht 5' 9"  (1.753 m)   Wt 176 lb 12.8 oz (80.2 kg)   SpO2 95%   BMI 26.11 kg/m   Wt Readings from Last 3 Encounters:  04/13/20 176 lb 12.8 oz (80.2 kg)  10/16/19 175 lb 9.6 oz (79.7 kg)  09/23/19 172 lb (78 kg)    Physical Exam Vitals and nursing note reviewed.  Constitutional:      General: He is not in acute distress.    Appearance: Normal appearance. He is well-developed and well-groomed. He is not ill-appearing or toxic-appearing.  HENT:     Head: Normocephalic and atraumatic.     Nose:     Comments: [name] is in place, covering mouth and nose.  Eyes:     General:        Right eye: No discharge.        Left eye: No discharge.     Extraocular Movements: Extraocular movements intact.     Conjunctiva/sclera: Conjunctivae normal.     Pupils: Pupils are equal, round, and reactive to light.  Cardiovascular:     Rate and Rhythm: Normal rate and regular rhythm.     Pulses: Normal pulses.     Heart sounds: Normal heart sounds. No murmur heard. No friction rub. No gallop.   Pulmonary:     Effort: Pulmonary effort is normal. No respiratory distress.     Breath sounds: Normal breath sounds.  Musculoskeletal:     Right lower leg: No edema.      Left lower leg: No edema.  Skin:    General: Skin is warm and dry.     Capillary Refill: Capillary refill takes less than 2 seconds.     Comments: Dermatitis noted around nose/mustache with redness, not fungal in appearance  Neurological:     General: No focal deficit present.     Mental Status: He is alert and oriented to person, place, and time.     Motor: Tremor present.  Psychiatric:        Attention and Perception: Attention and perception normal.        Mood and Affect: Mood and affect normal.        Speech: Speech normal.        Behavior: Behavior normal. Behavior is cooperative.        Thought Content: Thought content normal.        Cognition and Memory: Cognition and memory normal.    Results for orders placed or performed in visit on 08/22/19  Lipid Profile  Result Value Ref Range   Cholesterol 195 <200 mg/dL   HDL 42 > OR = 40 mg/dL   Triglycerides 78 <150 mg/dL   LDL Cholesterol (Calc) 136 (H) mg/dL (calc)   Total CHOL/HDL Ratio 4.6 <5.0 (calc)   Non-HDL Cholesterol (Calc) 153 (H) <130 mg/dL (calc)  Thyroid Panel With TSH  Result Value Ref Range   T3 Uptake 35 22 - 35 %   T4, Total 10.3 4.9 - 10.5 mcg/dL   Free Thyroxine Index 3.6 1.4 - 3.8   TSH 2.89 0.40 - 4.50 mIU/L      Assessment & Plan:   Problem List Items Addressed This Visit      Nervous and Auditory   Benign essential tremor    Requesting to discontinue propanolol for previously diagnosed benign essential tremor from 10/31/2017 with previous PCP with [name] in [place], [place].  Reports that his hand tremor ceases at rest or with exertion.  He has been off of this medication for 2 days with current tremor at rest.  Discussed he has not yet been to Neurology, as we had discussed in the past, and would encourage that referral.  Reports he was advised by previously treating provider that it did not need additional evaluation.  Encouraged neurology evaluation as has been present > 2.5 years  now.  Patient declines.  Plan: 1. Can stop the propanolol 2. If requesting to restart propanolol in the future, will need neurology referral for evaluation/treatment plan 3. RTC PRN        Musculoskeletal and Integument   Dermatitis - Primary    Dermatitis noted around nose/mouth, not fungal in appearance, will treat topically with steroid cream.  Patient requesting eucrisa, based on seeing on television, needing PA, will send in triamcinolone cream to apply as directed.         Meds ordered this encounter  Medications  . DISCONTD: Crisaborole (EUCRISA) 2 % OINT    Sig: Apply 1 application topically daily.    Dispense:  100 g    Refill:  1  . triamcinolone ointment (KENALOG) 0.5 %    Sig: Apply 1 application topically 2 (two) times daily.    Dispense:  30 g    Refill:  0    Follow up plan: Return if symptoms worsen or fail to improve.   Harlin Rain, [place] [facility] Nurse Practitioner [facility] 04/13/2020, 11:26 AM

## 2020-04-14 ENCOUNTER — Encounter: Payer: Self-pay | Admitting: Family Medicine

## 2020-04-14 MED ORDER — TRIAMCINOLONE ACETONIDE 0.5 % EX OINT: 1.0000 "application " | TOPICAL_OINTMENT | Freq: Two times a day (BID) | CUTANEOUS | 0 refills | Status: DC

## 2020-04-14 NOTE — Assessment & Plan Note (Signed)
Requesting to discontinue propanolol for previously diagnosed benign essential tremor from 10/31/2017 with previous PCP with [name] in [place], [place].  Reports that his hand tremor ceases at rest or with exertion.  He has been off of this medication for 2 days with current tremor at rest.  Discussed he has not yet been to Neurology, as we had discussed in the past, and would encourage that referral.  Reports he was advised by previously treating provider that it did not need additional evaluation.  Encouraged neurology evaluation as has been present > 2.5 years now.  Patient declines.  Plan: 1. Can stop the propanolol 2. If requesting to restart propanolol in the future, will need neurology referral for evaluation/treatment plan 3. RTC PRN

## 2020-04-14 NOTE — Assessment & Plan Note (Signed)
Dermatitis noted around nose/mouth, not fungal in appearance, will treat topically with steroid cream.  Patient requesting eucrisa, based on seeing on television, needing PA, will send in triamcinolone cream to apply as directed.

## 2020-04-27 DIAGNOSIS — H524 Presbyopia: Secondary | ICD-10-CM | POA: Diagnosis not present

## 2020-04-27 DIAGNOSIS — Z0100 Encounter for examination of eyes and vision without abnormal findings: Secondary | ICD-10-CM | POA: Diagnosis not present

## 2020-05-03 DIAGNOSIS — Z79899 Other long term (current) drug therapy: Secondary | ICD-10-CM | POA: Diagnosis not present

## 2020-05-03 DIAGNOSIS — N401 Enlarged prostate with lower urinary tract symptoms: Secondary | ICD-10-CM | POA: Diagnosis not present

## 2020-05-03 DIAGNOSIS — E291 Testicular hypofunction: Secondary | ICD-10-CM | POA: Diagnosis not present

## 2020-05-03 DIAGNOSIS — N5201 Erectile dysfunction due to arterial insufficiency: Secondary | ICD-10-CM | POA: Diagnosis not present

## 2020-05-13 DIAGNOSIS — N401 Enlarged prostate with lower urinary tract symptoms: Secondary | ICD-10-CM | POA: Diagnosis not present

## 2020-05-17 DIAGNOSIS — R291 Meningismus: Secondary | ICD-10-CM | POA: Diagnosis not present

## 2020-05-17 DIAGNOSIS — N401 Enlarged prostate with lower urinary tract symptoms: Secondary | ICD-10-CM | POA: Diagnosis not present

## 2020-05-17 DIAGNOSIS — E291 Testicular hypofunction: Secondary | ICD-10-CM | POA: Diagnosis not present

## 2020-05-21 DIAGNOSIS — N323 Diverticulum of bladder: Secondary | ICD-10-CM | POA: Diagnosis not present

## 2020-05-21 DIAGNOSIS — N401 Enlarged prostate with lower urinary tract symptoms: Secondary | ICD-10-CM | POA: Diagnosis not present

## 2020-05-26 ENCOUNTER — Other Ambulatory Visit: Payer: Self-pay

## 2020-05-26 ENCOUNTER — Telehealth: Payer: Self-pay | Admitting: Family Medicine

## 2020-05-26 DIAGNOSIS — G20 Parkinson's disease: Secondary | ICD-10-CM | POA: Diagnosis not present

## 2020-05-26 DIAGNOSIS — K219 Gastro-esophageal reflux disease without esophagitis: Secondary | ICD-10-CM

## 2020-05-26 DIAGNOSIS — K449 Diaphragmatic hernia without obstruction or gangrene: Secondary | ICD-10-CM

## 2020-05-26 DIAGNOSIS — F419 Anxiety disorder, unspecified: Secondary | ICD-10-CM | POA: Diagnosis not present

## 2020-05-26 DIAGNOSIS — J301 Allergic rhinitis due to pollen: Secondary | ICD-10-CM

## 2020-05-26 MED ORDER — OMEPRAZOLE 40 MG PO CPDR: 40.0000 mg | DELAYED_RELEASE_CAPSULE | Freq: Every day | ORAL | 1 refills | Status: DC

## 2020-05-26 MED ORDER — FLUTICASONE PROPIONATE 50 MCG/ACT NA SUSP: NASAL | 5 refills | Status: DC

## 2020-05-26 NOTE — Telephone Encounter (Signed)
Patient stated that he no longer needs a referral.

## 2020-05-26 NOTE — Telephone Encounter (Signed)
Copied from [place] [id number]. Topic: Referral - Request for Referral >> May 26, 2020 10:42 AM Leward Quan A wrote: Has patient seen PCP for this complaint? Yes.   *If NO, is insurance requiring patient see PCP for this issue before PCP can refer them? Referral for which specialty: Nuerology Preferred provider/office: [facility] Reason for referral: unsure

## 2020-05-26 NOTE — Telephone Encounter (Signed)
Please find out reason for referral and I can put it in

## 2020-06-03 ENCOUNTER — Telehealth: Payer: Self-pay

## 2020-06-03 NOTE — Telephone Encounter (Signed)
I called the patient to f/u on his message he sent. He informed me that he was recently diagnose with Parkinson and doesn't agree with the diagnoses. He is requesting a referral to another Neurologist. I informed the patient that we currently do not have any sooner appts before Tuesday. I also informed the patient that the only local neurology office is [facility]. He verbalize understanding.

## 2020-06-03 NOTE — Telephone Encounter (Signed)
Patient called back stated that he had been diagnosed with Parkinsons disease and cant wait till his appointment to be seen asking for a call back today please at Ph# [phone]

## 2020-06-03 NOTE — Telephone Encounter (Signed)
Attempted to contact the patient to f/u on his concerns. No answer or voicemail. If the patient calls back please get more details on his concerns or advise the patient to [place] his mychart or schedule an appt.

## 2020-06-03 NOTE — Telephone Encounter (Signed)
Copied from [place] [id number]. Topic: General - Inquiry >> Jun 03, 2020  9:24 AM Gillis Ends D wrote: Reason for CRM: Patient asks that the Dr. Julienne Kass him a call back after 1:00 after lunch. He has some questions. He can be reached at [phone]. Please advise

## 2020-06-03 NOTE — Telephone Encounter (Signed)
Patient recently seen in initial consultation by Dr Manuella Ghazi at [facility] and diagnosed with Parkinsons and treated.  It looks like he has scheduled a visit with Kathrine Haddock NP here at [name] on 06/08/20 to discuss this.  However, ultimately if patient has concerns with Parkinsons, it sounds like he needs to discuss this directly with his Neurologist, next apt is scheduled 08/30/20. He should call Dr Trena Demarais office to try to get in sooner if he has concerns with them, as Parkinsons is not something we routinely manage here.  Nobie Putnam, [facility] 06/03/2020, 2:57 PM

## 2020-06-07 NOTE — Telephone Encounter (Signed)
Open in error

## 2020-06-08 ENCOUNTER — Other Ambulatory Visit: Payer: Self-pay

## 2020-06-08 ENCOUNTER — Ambulatory Visit (INDEPENDENT_AMBULATORY_CARE_PROVIDER_SITE_OTHER): Payer: Medicare HMO | Admitting: Unknown Physician Specialty

## 2020-06-08 ENCOUNTER — Encounter: Payer: Self-pay | Admitting: Unknown Physician Specialty

## 2020-06-08 VITALS — BP 117/71 | HR 81 | Temp 98.0°F | Resp 17 | Ht 69.0 in | Wt 169.2 lb

## 2020-06-08 DIAGNOSIS — G20 Parkinson's disease: Secondary | ICD-10-CM

## 2020-06-08 DIAGNOSIS — G250 Essential tremor: Secondary | ICD-10-CM

## 2020-06-08 DIAGNOSIS — Z9181 History of falling: Secondary | ICD-10-CM

## 2020-06-08 NOTE — Assessment & Plan Note (Signed)
Probably secondary to Parkinson's.  High risk for falls.  Refer to social work to help with any home modifications needed

## 2020-06-08 NOTE — Assessment & Plan Note (Signed)
Would like to get a second opinion.  Will refer to [name] neurology.  Refer to social work to help manage any home remediation needs.
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
## 2020-06-09 LAB — THYROID PANEL WITH TSH
Free Thyroxine Index: 3.9 — ABNORMAL HIGH (ref 1.4–3.8)
T3 Uptake: 33 % (ref 22–35)
T4, Total: 11.7 ug/dL — ABNORMAL HIGH (ref 4.9–10.5)
TSH: 1.61 mIU/L (ref 0.40–4.50)

## 2020-06-10 ENCOUNTER — Telehealth: Payer: Self-pay

## 2020-06-10 DIAGNOSIS — N401 Enlarged prostate with lower urinary tract symptoms: Secondary | ICD-10-CM | POA: Diagnosis not present

## 2020-06-10 NOTE — Chronic Care Management (AMB) (Signed)
  Chronic Care Management   Note  06/10/2020 Name: Todd Green MRN: [id number] DOB: [birth year]  Todd Green is a 69 y.o. year old male who is a primary care patient of Lorine Bears, Lupita Raider, FNP. I reached out to [employer] by phone today in response to a referral sent by Todd Green's Green     Todd Green was given information about Chronic Care Management services today including:  1. CCM service includes personalized support from designated clinical staff supervised by his physician, including individualized plan of care and coordination with other care providers 2. 24/7 contact phone numbers for assistance for urgent and routine care needs. 3. Service will only be billed when office clinical staff spend 20 minutes or more in a month to coordinate care. 4. Only one practitioner may furnish and bill the service in a calendar month. 5. The patient may stop CCM services at any time (effective at the end of the month) by phone call to the office staff. 6. The patient will be responsible for cost sharing (co-pay) of up to 20% of the service fee (after annual deductible is met).  Patient agreed to services and verbal consent obtained.   Follow up plan: Telephone appointment with care management team member scheduled for:06/22/2020  Noreene Larsson, [place], [place], [place] [address] Direct Dial: [phone] [email or web address] Website: [email or web address]

## 2020-06-15 DIAGNOSIS — N323 Diverticulum of bladder: Secondary | ICD-10-CM | POA: Diagnosis not present

## 2020-06-15 DIAGNOSIS — N5201 Erectile dysfunction due to arterial insufficiency: Secondary | ICD-10-CM | POA: Diagnosis not present

## 2020-06-15 DIAGNOSIS — E291 Testicular hypofunction: Secondary | ICD-10-CM | POA: Diagnosis not present

## 2020-06-15 DIAGNOSIS — N401 Enlarged prostate with lower urinary tract symptoms: Secondary | ICD-10-CM | POA: Diagnosis not present

## 2020-06-22 ENCOUNTER — Telehealth: Payer: Self-pay

## 2020-06-22 ENCOUNTER — Ambulatory Visit (INDEPENDENT_AMBULATORY_CARE_PROVIDER_SITE_OTHER): Payer: Medicare HMO | Admitting: Licensed Clinical Social Worker

## 2020-06-22 DIAGNOSIS — E039 Hypothyroidism, unspecified: Secondary | ICD-10-CM

## 2020-06-22 DIAGNOSIS — G20 Parkinson's disease: Secondary | ICD-10-CM

## 2020-06-22 DIAGNOSIS — G20A1 Parkinson's disease without dyskinesia, without mention of fluctuations: Secondary | ICD-10-CM

## 2020-06-22 DIAGNOSIS — F419 Anxiety disorder, unspecified: Secondary | ICD-10-CM

## 2020-06-22 DIAGNOSIS — Z9181 History of falling: Secondary | ICD-10-CM

## 2020-06-22 DIAGNOSIS — G250 Essential tremor: Secondary | ICD-10-CM

## 2020-06-22 NOTE — Patient Instructions (Signed)
Licensed Clinical Social Worker Visit Information  Goals we discussed today:  Goals Addressed            This Visit's Progress   . SW-Manage My Emotions       Timeframe:  Long-Range Goal Priority:  Medium Start Date:  06/22/20                           Expected End Date:  09/22/20                     Follow Up Date- 07/21/20   - begin personal counseling - call and visit an old friend - check out volunteer opportunities - join a support group - laugh; watch a funny movie or comedian - learn and use visualization or guided imagery - perform a random act of kindness - practice relaxation or meditation daily - start or continue a personal journal - talk about feelings with a friend, family or spiritual advisor - practice positive thinking and self-talk    Why is this important?    When you are stressed, down or upset, your body reacts too.   For example, your blood pressure may get higher; you may have a headache or stomachache.   When your emotions get the best of you, your body's ability to fight off cold and flu gets weak.   These steps will help you manage your emotions.     CARE PLAN ENTRY (see longitudinal plan of care for additional care plan information)  Current Barriers:  . Patient with new diagnosis of Parkinson's Disease is in need of assistance with connection to community resources  . Knowledge deficits and need for support, education and care coordination related to community resources support  . Social Isolation and Limited access to caregiver  Clinical Goal(s)  . Over the next 120 days patient will implement healthy self-care as demonstrated by CCM LCSW . Over the next 120 days, patient will work with care management team member to gain education on new diagnosis, Parkinson's Disease.  . Over the next 120 days, patient will work with C3 Guide to address needs related to needing railing inside his home to prevent falls.   Interventions provided by LCSW:   . Assessed patient's care coordination needs related to Parkinson's Disease and discussed ongoing care management follow up  . Provided patient with information about local mental health resources but patient denies needing this resource implementation just yet. Patient is having new anxiety and panic attacks symptoms since receiving new diagnosis of Parkinson's Disease in February of 2022. Patient reports that he does not know anything about this disorder and would like for CCM LCSW to make a referral to CCM RNCM for education and assistance with managing this new diagnosis.  . Patient reports that he has been exercising daily since he was 27. He reports that he just left the gym and was unable to finish his workout because he started experiencing anxiety. . Patient's next neurologist appointment is on 08/30/20 with Dr. Manuella Ghazi but patient wanted  to get a second opinion. NP referred patient to [facility] neurology. He reports that he received one call from [facility] neurology already and is expecting an additional call in order to set up a new patient appointment. He was informed by [name] that they would be able to see him within 3 weeks.  . Advised patient to implement healthy self-care and deep breathing exercises into his daily routine  to combat anxiety.  . Patient wishes to gain railing inside his home to prevent future falls. Nash Dimmer with appropriate clinical care team members regarding patient needs . Patient interviewed and appropriate assessments performed . Discussed plans with patient for ongoing care management follow up and provided patient with direct contact information for care management team . Assisted patient/caregiver with obtaining information about health plan benefits . Provided education and assistance to client regarding Advanced Directives. . Provided education to patient/caregiver regarding level of care options. . Mindfulness or Relaxation Training, Emotional/Supportive Counseling, and  Psychoeducation and/or Health Education provided during session . Care Guide referral made ([id number]) for assistance with fall prevention. Patient is in need of railings inside his home but declines needing a ramp.   Patient Self Care Activities & Deficits:  . Patient is unable to independently navigate community resource options without care coordination support  . Acknowledges deficits and is motivated to resolve concern  . Lacks social connections . Attends all scheduled provider appointments, Calls provider office for new concerns or questions, Ability for insight, Independent living, and Motivation for treatment  Initial goal documentation          Mr. Torelli was given information about Chronic Care Management services today including:  1. CCM service includes personalized support from designated clinical staff supervised by his physician, including individualized plan of care and coordination with other care providers 2. 24/7 contact phone numbers for assistance for urgent and routine care needs. 3. Service will only be billed when office clinical staff spend 20 minutes or more in a month to coordinate care. 4. Only one practitioner may furnish and bill the service in a calendar month. 5. The patient may stop CCM services at any time (effective at the end of the month) by phone call to the office staff. 6. The patient will be responsible for cost sharing (co-pay) of up to 20% of the service fee (after annual deductible is met).  Patient agreed to services and verbal consent obtained.   Eula Fried, BSW, MSW, [email or web address] Phone: [phone]

## 2020-06-22 NOTE — Telephone Encounter (Signed)
Copied from [place] [id number]. Topic: General - Other >> Jun 22, 2020 10:16 AM Tessa Lerner A wrote: Reason for CRM: Patient has made contact to coordinate additional insurance information regarding a movement disorder specialist he was referred to for a second opinion of a previous diagnosis   Patient would like authorization from PCP faxed to [facility] Movement Disorder Specialist [phone]  [facility] would also like a referral as well as all of patients records submitted as well  Please contact to advise

## 2020-06-22 NOTE — Chronic Care Management (AMB) (Signed)
Chronic Care Management    Clinical Social Work Note  06/22/2020 Name: Todd Green MRN: [id number] DOB: 10/31/1950  Todd Green is a 70 y.o. year old male who is a primary care patient of Lorine Bears, Lupita Raider, FNP. The CCM team was consulted to assist the patient with chronic disease management and/or care coordination needs related to: [employer]  and [place] [employer].   Engaged with patient by telephone for initial visit in response to provider referral for social work chronic care management and care coordination services.   Consent to Services:  The patient was given the following information about Chronic Care Management services today, agreed to services, and gave verbal consent: 1. CCM service includes personalized support from designated clinical staff supervised by the primary care provider, including individualized plan of care and coordination with other care providers 2. 24/7 contact phone numbers for assistance for urgent and routine care needs. 3. Service will only be billed when office clinical staff spend 20 minutes or more in a month to coordinate care. 4. Only one practitioner may furnish and bill the service in a calendar month. 5.The patient may stop CCM services at any time (effective at the end of the month) by phone call to the office staff. 6. The patient will be responsible for cost sharing (co-pay) of up to 20% of the service fee (after annual deductible is met). Patient agreed to services and consent obtained.  Patient agreed to services and consent obtained.   Assessment: Review of patient past medical history, allergies, medications, and health status, including review of relevant consultants reports was performed today as part of a comprehensive evaluation and provision of chronic care management and care coordination services.     SDOH (Social Determinants of Health) assessments and interventions performed:    Advanced Directives  Status: See Care Plan for related entries.  CCM Care Plan  Allergies  Allergen Reactions  . Allegra [Fexofenadine] Other (See Comments)    Redness in gums  . Sulfa Antibiotics Rash    Outpatient Encounter Medications as of 06/22/2020  Medication Sig  . azelastine (ASTELIN) 0.1 % nasal spray Place 2 sprays into both nostrils 2 (two) times daily. Use in each nostril as directed (Patient not taking: No sig reported)  . busPIRone (BUSPAR) 5 MG tablet Take by mouth.  . carbidopa-levodopa (SINEMET IR) 25-100 MG tablet Take 1 tablet by mouth 3 (three) times daily.  . cetirizine (ZYRTEC) 10 MG tablet Take 1 tablet (10 mg total) by mouth daily. (Patient not taking: No sig reported)  . fluticasone (FLONASE) 50 MCG/ACT nasal spray 2 sprays by Each Nare route daily. (Patient not taking: No sig reported)  . levothyroxine (SYNTHROID) 137 MCG tablet TAKE 1 TABLET (137 MCG TOTAL) BY MOUTH DAILY BEFORE BREAKFAST (NEED MD APPOINTMENT) (Patient taking differently: Take 137 mcg by mouth daily before breakfast.)  . Multiple Vitamin (MULTI-VITAMIN) tablet Take 1 tablet by mouth daily.  Marland Kitchen omeprazole (PRILOSEC) 40 MG capsule Take 1 capsule (40 mg total) by mouth daily.  . propranolol ER (INDERAL LA) 60 MG 24 hr capsule Take 1 capsule (60 mg total) by mouth daily. (Patient not taking: Reported on 06/18/2020)  . tamsulosin (FLOMAX) 0.4 MG CAPS capsule Take 1 capsule (0.4 mg total) by mouth daily.  . Testosterone 20.25 MG/1.25GM (1.62%) GEL Apply 1 application topically daily.  Marland Kitchen triamcinolone ointment (KENALOG) 0.5 % Apply 1 application topically 2 (two) times daily. (Patient taking differently: Apply 1 application topically daily as needed (rash).)  No facility-administered encounter medications on file as of 06/22/2020.    Patient Active Problem List   Diagnosis Date Noted  . Parkinson disease ([place]) 06/08/2020  . Dermatitis 04/13/2020  . Nasal congestion 03/10/2020  . Seasonal allergies 03/08/2020  .  Constipation 06/24/2019  . Left hand pain 10/31/2017  . Benign essential tremor 10/31/2017  . Kyphosis of cervical region 02/03/2017  . Acquired hypothyroidism 02/02/2017  . Acid reflux 02/02/2017    Conditions to be addressed/monitored: Dementia; Level of care concerns and Mental Health Concerns   Care Plan : General Social Work (Adult)  Updates made by Greg Cutter, LCSW since 06/22/2020 12:00 AM    Problem: Anxiety Identification (Anxiety)     Long-Range Goal: Anxiety Symptoms Identified   Start Date: 06/22/2020  Priority: Medium  Note:   Timeframe:  Long-Range Goal Priority:  Medium Start Date:  06/22/20                           Expected End Date:  09/22/20                     Follow Up Date- 07/21/20   - begin personal counseling - call and visit an old friend - check out volunteer opportunities - join a support group - laugh; watch a funny movie or comedian - learn and use visualization or guided imagery - perform a random act of kindness - practice relaxation or meditation daily - start or continue a personal journal - talk about feelings with a friend, family or spiritual advisor - practice positive thinking and self-talk    Why is this important?    When you are stressed, down or upset, your body reacts too.   For example, your blood pressure may get higher; you may have a headache or stomachache.   When your emotions get the best of you, your body's ability to fight off cold and flu gets weak.   These steps will help you manage your emotions.     CARE PLAN ENTRY (see longitudinal plan of care for additional care plan information)  Current Barriers:  . Patient with new diagnosis of Parkinson's Disease is in need of assistance with connection to community resources  . Knowledge deficits and need for support, education and care coordination related to community resources support  . Social Isolation and Limited access to caregiver  Clinical Goal(s)  . Over  the next 120 days patient will implement healthy self-care as demonstrated by CCM LCSW . Over the next 120 days, patient will work with care management team member to gain education on new diagnosis, Parkinson's Disease.  . Over the next 120 days, patient will work with C3 Guide to address needs related to needing railing inside his home to prevent falls.   Interventions provided by LCSW:  . Assessed patient's care coordination needs related to Parkinson's Disease and discussed ongoing care management follow up  . Provided patient with information about local mental health resources but patient denies needing this resource implementation just yet. Patient is having new anxiety and panic attacks symptoms since receiving new diagnosis of Parkinson's Disease in February of 2022. Patient reports that he does not know anything about this disorder and would like for CCM LCSW to make a referral to CCM RNCM for education and assistance with managing this new diagnosis.  . Patient reports that he has been exercising daily since he was 34. He reports that he  just left the gym and was unable to finish his workout because he started experiencing anxiety. . Patient's next neurologist appointment is on 08/30/20 with Dr. Manuella Ghazi but patient wanted  to get a second opinion. NP referred patient to [facility] neurology. He reports that he received one call from [facility] neurology already and is expecting an additional call in order to set up a new patient appointment. He was informed by [facility] that they would be able to see him within 3 weeks.  . Advised patient to implement healthy self-care and deep breathing exercises into his daily routine to combat anxiety.  . Patient wishes to gain railing inside his home to prevent future falls. Nash Dimmer with appropriate clinical care team members regarding patient needs . Patient interviewed and appropriate assessments performed . Discussed plans with patient for ongoing care management follow  up and provided patient with direct contact information for care management team . Assisted patient/caregiver with obtaining information about health plan benefits . Provided education and assistance to client regarding Advanced Directives. . Provided education to patient/caregiver regarding level of care options. . Mindfulness or Relaxation Training, Emotional/Supportive Counseling, and Psychoeducation and/or Health Education provided during session . Care Guide referral made ([id number]) for assistance with fall prevention. Patient is in need of railings inside his home but declines needing a ramp.   Patient Self Care Activities & Deficits:  . Patient is unable to independently navigate community resource options without care coordination support  . Acknowledges deficits and is motivated to resolve concern  . Lacks social connections . Attends all scheduled provider appointments, Calls provider office for new concerns or questions, Ability for insight, Independent living, and Motivation for treatment  Initial goal documentation   Task: Identify Anxiety Symptoms and Facilitate Treatment        Follow Up Plan: SW will follow up with patient by phone over the next 30 days      Eula Fried, [place], MSW, [email or web address] Phone: [phone]

## 2020-06-23 ENCOUNTER — Other Ambulatory Visit: Payer: Self-pay

## 2020-06-23 ENCOUNTER — Telehealth: Payer: Self-pay

## 2020-06-23 DIAGNOSIS — G20 Parkinson's disease: Secondary | ICD-10-CM | POA: Diagnosis not present

## 2020-06-23 DIAGNOSIS — E538 Deficiency of other specified B group vitamins: Secondary | ICD-10-CM | POA: Diagnosis not present

## 2020-06-23 DIAGNOSIS — G250 Essential tremor: Secondary | ICD-10-CM | POA: Diagnosis not present

## 2020-06-23 NOTE — Chronic Care Management (AMB) (Signed)
  Chronic Care Management   Note  06/23/2020 Name: Remus Hagedorn MRN: [id number] DOB: [birth year]  General Wearing is a 70 y.o. year old male who is a primary care patient of Lorine Bears, Lupita Raider, FNP. Mina Carlisi is currently enrolled in care management services. An additional referral for RN CM  was placed.   Follow up plan: Telephone appointment with care management team member scheduled for:07/12/2020  Noreene Larsson, [place], [place], [place] [address] Direct Dial: [phone] [email or web address]: [email or web address]

## 2020-06-24 LAB — COMPLETE METABOLIC PANEL WITH GFR
AG Ratio: 1.5 (calc) (ref 1.0–2.5)
ALT: 9 U/L (ref 9–46)
AST: 12 U/L (ref 10–35)
Albumin: 3.6 g/dL (ref 3.6–5.1)
Alkaline phosphatase (APISO): 77 U/L (ref 35–144)
BUN: 23 mg/dL (ref 7–25)
CO2: 25 mmol/L (ref 20–32)
Calcium: 8.7 mg/dL (ref 8.6–10.3)
Chloride: 108 mmol/L (ref 98–110)
Creat: 0.89 mg/dL (ref 0.70–1.18)
GFR, Est African American: 100 mL/min/{1.73_m2} (ref >=60)
GFR, Est Non African American: 86 mL/min/{1.73_m2} (ref >=60)
Globulin: 2.4 g/dL (calc) (ref 1.9–3.7)
Glucose, Bld: 79 mg/dL (ref 65–99)
Potassium: 4.2 mmol/L (ref 3.5–5.3)
Sodium: 141 mmol/L (ref 135–146)
Total Bilirubin: 0.4 mg/dL (ref 0.2–1.2)
Total Protein: 6 g/dL — ABNORMAL LOW (ref 6.1–8.1)

## 2020-06-24 LAB — CBC
HCT: 41.4 % (ref 38.5–50.0)
Hemoglobin: 13.9 g/dL (ref 13.2–17.1)
MCH: 31.1 pg (ref 27.0–33.0)
MCHC: 33.6 g/dL (ref 32.0–36.0)
MCV: 92.6 fL (ref 80.0–100.0)
MPV: 9.5 fL (ref 7.5–12.5)
Platelets: 322 10*3/uL (ref 140–400)
RBC: 4.47 10*6/uL (ref 4.20–5.80)
RDW: 12.3 % (ref 11.0–15.0)
WBC: 9 10*3/uL (ref 3.8–10.8)

## 2020-06-24 LAB — VITAMIN B12: Vitamin B-12: 405 pg/mL (ref 200–1100)

## 2020-06-25 ENCOUNTER — Other Ambulatory Visit
Admission: RE | Admit: 2020-06-25 | Discharge: 2020-06-25 | Disposition: A | Discharge status: Home / Self Care | Payer: Medicare HMO | Source: Ambulatory Visit | Attending: Urology | Admitting: Urology

## 2020-06-25 ENCOUNTER — Other Ambulatory Visit: Payer: Medicare HMO

## 2020-06-25 ENCOUNTER — Other Ambulatory Visit: Payer: Self-pay

## 2020-06-25 HISTORY — DX: Anxiety disorder, unspecified: F41.9

## 2020-06-25 HISTORY — DX: Parkinson's disease without dyskinesia, without mention of fluctuations: G20.A1

## 2020-06-25 HISTORY — DX: Parkinson's disease: G20

## 2020-06-25 NOTE — Patient Instructions (Addendum)
Your procedure is scheduled on: Thursday July 01, 2020. Report to Day Surgery inside [facility] 2nd floor (stop by Admissions desk first before getting on Elevator) . To find out your arrival time please call [phone] between 1PM - 3PM on Wednesday June 30, 2020.  Remember: Instructions that are not followed completely may result in serious medical risk,  up to and including death, or upon the discretion of your surgeon and anesthesiologist your  surgery may need to be rescheduled.     _X__ 1. Do not eat food after midnight the night before your procedure.                 No chewing gum or hard candies. You may drink clear liquids up to 2 hours                 before you are scheduled to arrive for your surgery- DO not drink clear                 liquids within 2 hours of the start of your surgery.                 Clear Liquids include:  water, apple juice without pulp, clear Gatorade, G2 or                  Gatorade Zero (avoid Red/Purple/Blue), Black Coffee or Tea (Do not add                 anything to coffee or tea).  __X__2.  On the morning of surgery brush your teeth with toothpaste and water, you                may rinse your mouth with mouthwash if you wish.  Do not swallow any toothpaste of mouthwash.     _X__ 3.  No Alcohol for 24 hours before or after surgery.   _X__ 4.  Do Not Smoke or use e-cigarettes For 24 Hours Prior to Your Surgery.                 Do not use any chewable tobacco products for at least 6 hours prior to                 Surgery.  _X__  5.  Do not use any recreational drugs (marijuana, cocaine, heroin, ecstasy, MDMA or other)                For at least one week prior to your surgery.  Combination of these drugs with anesthesia                May have life threatening results.  __X__ 6.  Notify your doctor if there is any change in your medical condition      (cold, fever, infections).     Do not wear jewelry, make-up,  hairpins, clips or nail polish. Do not wear lotions, powders, or perfumes. You may wear deodorant. Do not shave 48 hours prior to surgery. Men may shave face and neck. Do not bring valuables to the hospital.    [name] is not responsible for any belongings or valuables.  Contacts, dentures or bridgework may not be worn into surgery. Leave your suitcase in the car. After surgery it may be brought to your room. For patients admitted to the hospital, discharge time is determined by your treatment team.   Patients discharged the day of surgery will not be allowed to  drive home.   Make arrangements for someone to be with you for the first 24 hours of your Same Day Discharge.   __X__ Take these medicines the morning of surgery with A SIP OF WATER:    1. carbidopa-levodopa (SINEMET IR) 25-100 MG  2. levothyroxine (SYNTHROID) 137 MCG  3. omeprazole (PRILOSEC) 40 MG   ____ Fleet Enema (as directed)   ____ Use CHG Soap (or wipes) as directed  ____ Use Benzoyl Peroxide Gel as instructed  ____ Use inhalers on the day of surgery  ____ Stop metformin 2 days prior to surgery    ____ Take 1/2 of usual insulin dose the night before surgery. No insulin the morning          of surgery.   __X__ Stop Anti-inflammatories such as Ibuprofen, Aleve, Advil, naproxen, aspirin and or BC powders.    __X__ Stop supplements until after surgery.    __X__ Do not start any herbal supplements before your procedure.    If you have any questions regarding your pre-procedure instructions,  Please call Pre-admit Testing at [phone].

## 2020-06-29 ENCOUNTER — Other Ambulatory Visit: Payer: Medicare Other

## 2020-06-29 ENCOUNTER — Encounter
Admission: RE | Admit: 2020-06-29 | Discharge: 2020-06-29 | Disposition: A | Discharge status: Home / Self Care | Payer: Medicare HMO | Source: Ambulatory Visit | Attending: Urology | Admitting: Urology

## 2020-06-29 ENCOUNTER — Other Ambulatory Visit: Payer: Self-pay

## 2020-06-29 DIAGNOSIS — I4510 Unspecified right bundle-branch block: Secondary | ICD-10-CM | POA: Insufficient documentation

## 2020-06-29 DIAGNOSIS — Z01818 Encounter for other preprocedural examination: Secondary | ICD-10-CM | POA: Diagnosis not present

## 2020-06-29 DIAGNOSIS — Z20822 Contact with and (suspected) exposure to covid-19: Secondary | ICD-10-CM | POA: Diagnosis not present
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
## 2020-06-30 LAB — SARS CORONAVIRUS 2 (TAT 6-24 HRS): SARS Coronavirus 2: NEGATIVE

## 2020-07-01 ENCOUNTER — Encounter
Admission: RE | Disposition: A | Discharge status: Home / Self Care | Payer: Self-pay | Source: Home / Self Care | Attending: Urology

## 2020-07-01 ENCOUNTER — Ambulatory Visit: Payer: Medicare HMO | Admitting: Anesthesiology

## 2020-07-01 ENCOUNTER — Encounter: Payer: Self-pay | Admitting: Urology

## 2020-07-01 ENCOUNTER — Other Ambulatory Visit: Payer: Self-pay

## 2020-07-01 ENCOUNTER — Ambulatory Visit
Admission: RE | Admit: 2020-07-01 | Discharge: 2020-07-01 | Disposition: A | Discharge status: Home / Self Care | Payer: Medicare HMO | Attending: Urology | Admitting: Urology

## 2020-07-01 DIAGNOSIS — N138 Other obstructive and reflux uropathy: Secondary | ICD-10-CM | POA: Insufficient documentation

## 2020-07-01 DIAGNOSIS — G20 Parkinson's disease: Secondary | ICD-10-CM | POA: Diagnosis not present

## 2020-07-01 DIAGNOSIS — Z882 Allergy status to sulfonamides status: Secondary | ICD-10-CM | POA: Diagnosis not present

## 2020-07-01 DIAGNOSIS — R0981 Nasal congestion: Secondary | ICD-10-CM | POA: Diagnosis not present

## 2020-07-01 DIAGNOSIS — N401 Enlarged prostate with lower urinary tract symptoms: Secondary | ICD-10-CM | POA: Insufficient documentation

## 2020-07-01 DIAGNOSIS — N400 Enlarged prostate without lower urinary tract symptoms: Secondary | ICD-10-CM | POA: Diagnosis not present

## 2020-07-01 DIAGNOSIS — K219 Gastro-esophageal reflux disease without esophagitis: Secondary | ICD-10-CM | POA: Diagnosis not present

## 2020-07-01 HISTORY — PX: CYSTOSCOPY WITH INSERTION OF UROLIFT: SHX6678

## 2020-07-01 SURGERY — CYSTOSCOPY WITH INSERTION OF UROLIFT
Anesthesia: General

## 2020-07-01 MED ORDER — ACETAMINOPHEN 10 MG/ML IV SOLN: 1000.0000 mg | Freq: Once | INTRAVENOUS | Status: DC | PRN

## 2020-07-01 MED ORDER — FENTANYL CITRATE (PF) 100 MCG/2ML IJ SOLN
25.0000 ug | INTRAMUSCULAR | Status: DC | PRN
Start: 2020-07-01 — End: 2020-07-01

## 2020-07-01 MED ORDER — CHLORHEXIDINE GLUCONATE 0.12 % MT SOLN
OROMUCOSAL | Status: AC
  Filled 2020-07-01: qty 15

## 2020-07-01 MED ORDER — PHENYLEPHRINE HCL (PRESSORS) 10 MG/ML IV SOLN
INTRAVENOUS | Status: DC | PRN
  Administered 2020-07-01: 100 ug via INTRAVENOUS
  Administered 2020-07-01 (×3): 50 ug via INTRAVENOUS

## 2020-07-01 MED ORDER — KETOROLAC TROMETHAMINE 30 MG/ML IJ SOLN
INTRAMUSCULAR | Status: DC | PRN
  Administered 2020-07-01: 30 mg via INTRAVENOUS

## 2020-07-01 MED ORDER — PROPOFOL 500 MG/50ML IV EMUL
INTRAVENOUS | Status: DC | PRN
  Administered 2020-07-01: 150 ug/kg/min via INTRAVENOUS

## 2020-07-01 MED ORDER — ONDANSETRON HCL 4 MG/2ML IJ SOLN: 4.0000 mg | Freq: Once | INTRAMUSCULAR | Status: DC | PRN

## 2020-07-01 MED ORDER — PROPOFOL 10 MG/ML IV BOLUS
INTRAVENOUS | Status: AC
  Filled 2020-07-01: qty 20

## 2020-07-01 MED ORDER — ONDANSETRON HCL 4 MG/2ML IJ SOLN
INTRAMUSCULAR | Status: DC | PRN
  Administered 2020-07-01: 4 mg via INTRAVENOUS

## 2020-07-01 MED ORDER — URIBEL 118 MG PO CAPS
1.0000 | ORAL_CAPSULE | Freq: Four times a day (QID) | ORAL | 3 refills | Status: DC | PRN
Start: 2020-07-01 — End: 2020-11-09

## 2020-07-01 MED ORDER — CHLORHEXIDINE GLUCONATE 0.12 % MT SOLN
15.0000 mL | Freq: Once | OROMUCOSAL | Status: AC
  Administered 2020-07-01: 15 mL via OROMUCOSAL

## 2020-07-01 MED ORDER — FENTANYL CITRATE (PF) 100 MCG/2ML IJ SOLN
INTRAMUSCULAR | Status: DC | PRN
  Administered 2020-07-01 (×3): 25 ug via INTRAVENOUS

## 2020-07-01 MED ORDER — LIDOCAINE HCL URETHRAL/MUCOSAL 2 % EX GEL
CUTANEOUS | Status: AC
  Filled 2020-07-01: qty 10

## 2020-07-01 MED ORDER — CIPROFLOXACIN HCL 500 MG PO TABS: 500.0000 mg | ORAL_TABLET | Freq: Two times a day (BID) | ORAL | 0 refills | Status: DC

## 2020-07-01 MED ORDER — PROPOFOL 10 MG/ML IV BOLUS
INTRAVENOUS | Status: DC | PRN
  Administered 2020-07-01: 20 mg via INTRAVENOUS
  Administered 2020-07-01: 30 mg via INTRAVENOUS
  Administered 2020-07-01: 40 mg via INTRAVENOUS
  Administered 2020-07-01 (×2): 20 mg via INTRAVENOUS
  Administered 2020-07-01: 10 mg via INTRAVENOUS
  Administered 2020-07-01: 20 mg via INTRAVENOUS

## 2020-07-01 MED ORDER — LIDOCAINE 2% (20 MG/ML) 5 ML SYRINGE
INTRAMUSCULAR | Status: DC | PRN
  Administered 2020-07-01: 60 mg via INTRAVENOUS

## 2020-07-01 MED ORDER — FENTANYL CITRATE (PF) 100 MCG/2ML IJ SOLN
INTRAMUSCULAR | Status: AC
  Filled 2020-07-01: qty 2

## 2020-07-01 MED ORDER — ORAL CARE MOUTH RINSE: 15.0000 mL | Freq: Once | OROMUCOSAL | Status: AC

## 2020-07-01 MED ORDER — LACTATED RINGERS IV SOLN: INTRAVENOUS | Status: DC

## 2020-07-01 MED ORDER — OXYCODONE HCL 5 MG PO TABS: 5.0000 mg | ORAL_TABLET | Freq: Once | ORAL | Status: DC | PRN

## 2020-07-01 MED ORDER — OXYCODONE HCL 5 MG/5ML PO SOLN: 5.0000 mg | Freq: Once | ORAL | Status: DC | PRN

## 2020-07-01 MED ORDER — CEFAZOLIN SODIUM-DEXTROSE 1-4 GM/50ML-% IV SOLN
INTRAVENOUS | Status: AC
  Filled 2020-07-01: qty 50

## 2020-07-01 MED ORDER — CEFAZOLIN SODIUM-DEXTROSE 1-4 GM/50ML-% IV SOLN: 1.0000 g | Freq: Once | INTRAVENOUS | Status: DC

## 2020-07-01 MED ORDER — PROPOFOL 500 MG/50ML IV EMUL
INTRAVENOUS | Status: AC
  Filled 2020-07-01: qty 50

## 2020-07-01 MED ORDER — HYOSCYAMINE SULFATE SL 0.125 MG SL SUBL: 0.1250 mg | SUBLINGUAL_TABLET | SUBLINGUAL | 3 refills | Status: DC | PRN

## 2020-07-01 MED ORDER — LIDOCAINE HCL URETHRAL/MUCOSAL 2 % EX GEL
CUTANEOUS | Status: DC | PRN
  Administered 2020-07-01: 1

## 2020-07-01 MED ORDER — EPHEDRINE 5 MG/ML INJ
INTRAVENOUS | Status: AC
  Filled 2020-07-01: qty 10

## 2020-07-01 SURGICAL SUPPLY — 19 items
BAG DRAIN CYSTO-URO LG1000N (MISCELLANEOUS) ×2 IMPLANT
BAG DRN RND TRDRP ANRFLXCHMBR (UROLOGICAL SUPPLIES) ×1
BAG URINE DRAIN 2000ML AR STRL (UROLOGICAL SUPPLIES) ×1 IMPLANT
CATH FOL 2WAY LX 16X30 (CATHETERS) ×1 IMPLANT
GLOVE SURG ENC MOIS LTX SZ7.5 (GLOVE) ×2 IMPLANT
GOWN STRL REUS W/ TWL LRG LVL3 (GOWN DISPOSABLE) ×1 IMPLANT
GOWN STRL REUS W/ TWL XL LVL3 (GOWN DISPOSABLE) ×1 IMPLANT
GOWN STRL REUS W/TWL LRG LVL3 (GOWN DISPOSABLE) ×2
GOWN STRL REUS W/TWL XL LVL3 (GOWN DISPOSABLE) ×2
KIT TURNOVER CYSTO (KITS) ×2 IMPLANT
MANIFOLD NEPTUNE II (INSTRUMENTS) ×1 IMPLANT
PACK CYSTO AR (MISCELLANEOUS) ×2 IMPLANT
SET CYSTO W/LG BORE CLAMP LF (SET/KITS/TRAYS/PACK) ×2 IMPLANT
SURGILUBE 2OZ TUBE FLIPTOP (MISCELLANEOUS) ×2 IMPLANT
SYR TOOMEY IRRIG 70ML (MISCELLANEOUS) ×2
SYRINGE TOOMEY IRRIG 70ML (MISCELLANEOUS) IMPLANT
SYSTEM UROLIFT (Male Continence) ×6 IMPLANT
WATER STERILE IRR 1000ML POUR (IV SOLUTION) ×2 IMPLANT
WATER STERILE IRR 3000ML UROMA (IV SOLUTION) ×2 IMPLANT

## 2020-07-01 NOTE — Anesthesia Procedure Notes (Signed)
Date/Time: 07/01/2020 8:40 AM Performed by: Allean Found, CRNA Pre-anesthesia Checklist: Patient identified, Emergency Drugs available, Suction available, Patient being monitored and Timeout performed Patient Re-evaluated:Patient Re-evaluated prior to induction Oxygen Delivery Method: Nasal cannula Placement Confirmation: positive ETCO2

## 2020-07-01 NOTE — Transfer of Care (Signed)
Immediate Anesthesia Transfer of Care Note  Patient: Todd Green  Procedure(s) Performed: CYSTOSCOPY WITH INSERTION OF UROLIFT (N/A )  Patient Location: PACU  Anesthesia Type:General  Level of Consciousness: awake  Airway & Oxygen Therapy: Patient Spontanous Breathing and Patient connected to face mask oxygen  Post-op Assessment: Report given to RN and Post -op Vital signs reviewed and stable  Post vital signs: Reviewed and stable  Last Vitals:  Vitals Value Taken Time  BP 126/76 07/01/20 0930  Temp 36.5 C 07/01/20 0927  Pulse 83 07/01/20 0931  Resp 19 07/01/20 0931  SpO2 100 % 07/01/20 0931  Vitals shown include unvalidated device data.  Last Pain:  Vitals:   07/01/20 0716  TempSrc: Temporal  PainSc: 0-No pain         Complications: No complications documented.

## 2020-07-01 NOTE — Discharge Instructions (Signed)
Benign Prostatic Hyperplasia  Benign prostatic hyperplasia (BPH) is an enlarged prostate gland that is caused by the normal aging process and not by cancer. The prostate is a walnut-sized gland that is involved in the production of semen. It is located in front of the rectum and below the bladder. The bladder stores urine and the urethra is the tube that carries the urine out of the body. The prostate may get bigger as a man gets older. An enlarged prostate can press on the urethra. This can make it harder to pass urine. The build-up of urine in the bladder can cause infection. Back pressure and infection may progress to bladder damage and kidney (renal) failure. What are the causes? This condition is part of a normal aging process. However, not all men develop problems from this condition. If the prostate enlarges away from the urethra, urine flow will not be blocked. If it enlarges toward the urethra and compresses it, there will be problems passing urine. What increases the risk? This condition is more likely to develop in men over the age of 50 years. What are the signs or symptoms? Symptoms of this condition include:  Getting up often during the night to urinate.  Needing to urinate frequently during the day.  Difficulty starting urine flow.  Decrease in size and strength of your urine stream.  Leaking (dribbling) after urinating.  Inability to pass urine. This needs immediate treatment.  Inability to completely empty your bladder.  Pain when you pass urine. This is more common if there is also an infection.  Urinary tract infection (UTI). How is this diagnosed? This condition is diagnosed based on your medical history, a physical exam, and your symptoms. Tests will also be done, such as:  A post-void bladder scan. This measures any amount of urine that may remain in your bladder after you finish urinating.  A digital rectal exam. In a rectal exam, your health care provider  checks your prostate by putting a lubricated, gloved finger into your rectum to feel the back of your prostate gland. This exam detects the size of your gland and any abnormal lumps or growths.  An exam of your urine (urinalysis).  A prostate specific antigen (PSA) screening. This is a blood test used to screen for prostate cancer.  An ultrasound. This test uses sound waves to electronically produce a picture of your prostate gland. Your health care provider may refer you to a specialist in kidney and prostate diseases (urologist). How is this treated? Once symptoms begin, your health care provider will monitor your condition (active surveillance or watchful waiting). Treatment for this condition will depend on the severity of your condition. Treatment may include:  Observation and yearly exams. This may be the only treatment needed if your condition and symptoms are mild.  Medicines to relieve your symptoms, including: ? Medicines to shrink the prostate. ? Medicines to relax the muscle of the prostate.  Surgery in severe cases. Surgery may include: ? Prostatectomy. In this procedure, the prostate tissue is removed completely through an open incision or with a laparoscope or robotics. ? Transurethral resection of the prostate (TURP). In this procedure, a tool is inserted through the opening at the tip of the penis (urethra). It is used to cut away tissue of the inner core of the prostate. The pieces are removed through the same opening of the penis. This removes the blockage. ? Transurethral incision (TUIP). In this procedure, small cuts are made in the prostate. This lessens   the prostate's pressure on the urethra. ? Transurethral microwave thermotherapy (TUMT). This procedure uses microwaves to create heat. The heat destroys and removes a small amount of prostate tissue. ? Transurethral needle ablation (TUNA). This procedure uses radio frequencies to destroy and remove a small amount of  prostate tissue. ? Interstitial laser coagulation ([place]). This procedure uses a laser to destroy and remove a small amount of prostate tissue. ? Transurethral electrovaporization (TUVP). This procedure uses electrodes to destroy and remove a small amount of prostate tissue. ? Prostatic urethral lift. This procedure inserts an implant to push the lobes of the prostate away from the urethra. Follow these instructions at home:  Take over-the-counter and prescription medicines only as told by your health care provider.  Monitor your symptoms for any changes. Contact your health care provider with any changes.  Avoid drinking large amounts of liquid before going to bed or out in public.  Avoid or reduce how much caffeine or alcohol you drink.  Give yourself time when you urinate.  Keep all follow-up visits as told by your health care provider. This is important. Contact a health care provider if:  You have unexplained back pain.  Your symptoms do not get better with treatment.  You develop side effects from the medicine you are taking.  Your urine becomes very dark or has a bad smell.  Your lower abdomen becomes distended and you have trouble passing your urine. Get help right away if:  You have a fever or chills.  You suddenly cannot urinate.  You feel lightheaded, or very dizzy, or you faint.  There are large amounts of blood or clots in the urine.  Your urinary problems become hard to manage.  You develop moderate to severe low back or flank pain. The flank is the side of your body between the ribs and the hip. These symptoms may represent a serious problem that is an emergency. Do not wait to see if the symptoms will go away. Get medical help right away. Call your local emergency services (911 in the [place] Do not drive yourself to the hospital. Summary  Benign prostatic hyperplasia (BPH) is an enlarged prostate that is caused by the normal aging process and not by  cancer.  An enlarged prostate can press on the urethra. This can make it hard to pass urine.  This condition is part of a normal aging process and is more likely to develop in men over the age of 13 years.  Get help right away if you suddenly cannot urinate. This information is not intended to replace advice given to you by your health care provider. Make sure you discuss any questions you have with your health care provider. Document Revised: 12/18/2019 Document Reviewed: 12/18/2019 Elsevier Patient Education  2021 [place]   1) The drugs that you were given will stay in your system until tomorrow so for the next 24 hours you should not:  A) Drive an automobile B) Make any legal decisions C) Drink any alcoholic beverage   2) You may resume regular meals tomorrow.  Today it is better to start with liquids and gradually work up to solid foods.  You may eat anything you prefer, but it is better to start with liquids, then soup and crackers, and gradually work up to solid foods.   3) Please notify your doctor immediately if you have any unusual bleeding, trouble breathing, redness and pain at the surgery site, drainage, fever,  or pain not relieved by medication.  4) Your post-operative visit with Dr.                                     is: Date:                        Time:    Please call to schedule your post-operative visit.  5) Additional Instructions:
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
## 2020-07-01 NOTE — Op Note (Signed)
Preoperative diagnosis: BPH with bladder outlet obstruction (N40.1)  Postoperative diagnosis: Same  Procedure: UroLift (CPT 52441, 52442 x5)  Surgeon: Otelia Limes. Yves Dill MD  Anesthesia: General  Indications:See the history and physical. After informed consent the above procedure(s) were requested     Technique and findings:   Patient was brought to the cystoscopy suite and placed into dorsal lithotomy position. The perineum was then sterilely prepped and draped in the usual fashion.  Cystoscopy was performed, and patient was found to have lateral lobe obstruction with a small nonobstructing posterior median lobe.  No bladder mucosal lesions were identified and both ureteral orifices had clear efflux.  The 1st pair of implants were placed at the 10:00 and 2:00 positions 1.5 - 2cm distal to the bladder neck. The 2nd pair of implants were placed at the level of the verumontanum at the 10:00 and 2:00 positions.  A third pair of implants were placed at the 10:00 and 2:00 positions between the other implants.  Cystoscopy was performed at completion, and no injury to the bladder or urethra was detected.  The prostatic urethra had a widely patent anterior channel.  The cystoscope was then removed and 10 cc of viscous Xylocaine instilled within the urethra and the bladder.  A 16 Foley catheter was placed and irrigated until clear and then removed.  Blood loss was minimal.  The patient tolerated the procedure well and transferred to the recovery room in stable condition.

## 2020-07-01 NOTE — H&P (Signed)
Date of Initial H&P: 06/24/20  History reviewed, patient examined, no change in status, stable for surgery.

## 2020-07-02 ENCOUNTER — Telehealth: Payer: Self-pay

## 2020-07-02 ENCOUNTER — Encounter: Payer: Self-pay | Admitting: Urology
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
## 2020-07-02 NOTE — Anesthesia Postprocedure Evaluation (Signed)
Anesthesia Post Note  Patient: Todd Green  Procedure(s) Performed: CYSTOSCOPY WITH INSERTION OF UROLIFT (N/A )  Patient location during evaluation: PACU Anesthesia Type: General Level of consciousness: awake and alert Pain management: pain level controlled Vital Signs Assessment: post-procedure vital signs reviewed and stable Respiratory status: spontaneous breathing, nonlabored ventilation, respiratory function stable and patient connected to nasal cannula oxygen Cardiovascular status: blood pressure returned to baseline and stable Postop Assessment: no apparent nausea or vomiting Anesthetic complications: no   No complications documented.   Last Vitals:  Vitals:   07/01/20 1004 07/01/20 1024  BP: (!) 148/80 136/83  Pulse: 74 70  Resp: 18 16  Temp: 36.5 C   SpO2: 100% 100%    Last Pain:  Vitals:   07/02/20 0748  TempSrc:   PainSc: 0-No pain                 Arita Miss

## 2020-07-02 NOTE — Telephone Encounter (Signed)
I called concerning his requests for a MRI for his tremors. I informed the  patient that he need to speak with his neurologist concerning ordering the MRI. He informed me that he have not heard from [facility] about scheduling an appt for a second opinion. He doesn't feel like he have parkinsons and want a second opinion. I sent a message to Lionel December, referral coordinator concerning his referral.

## 2020-07-05 ENCOUNTER — Encounter: Payer: Self-pay | Admitting: Family Medicine

## 2020-07-05 ENCOUNTER — Other Ambulatory Visit: Payer: Self-pay

## 2020-07-05 ENCOUNTER — Ambulatory Visit (INDEPENDENT_AMBULATORY_CARE_PROVIDER_SITE_OTHER): Payer: Medicare HMO | Admitting: Family Medicine

## 2020-07-05 VITALS — BP 128/71 | HR 76 | Temp 98.6°F | Resp 18 | Ht 69.0 in | Wt 168.2 lb

## 2020-07-05 DIAGNOSIS — M5030 Other cervical disc degeneration, unspecified cervical region: Secondary | ICD-10-CM | POA: Diagnosis not present

## 2020-07-05 DIAGNOSIS — S199XXA Unspecified injury of neck, initial encounter: Secondary | ICD-10-CM

## 2020-07-05 DIAGNOSIS — R251 Tremor, unspecified: Secondary | ICD-10-CM

## 2020-07-05 MED ORDER — DICLOFENAC SODIUM 1 % EX GEL: 2.0000 g | Freq: Four times a day (QID) | CUTANEOUS | 2 refills | Status: DC

## 2020-07-05 NOTE — Patient Instructions (Addendum)
Thank you for coming to the office today.  Try topical Diclofenac anti inflammatory up to 4 times a day.  Caution with steroid  Use coupon if not covered  Please schedule a Follow-up Appointment to: Return if symptoms worsen or fail to improve.  If you have any other questions or concerns, please feel free to call the office or send a message through [facility]. You may also schedule an earlier appointment if necessary.  Additionally, you may be receiving a survey about your experience at our office within a few days to 1 week by e-mail or mail. We value your feedback.  Nobie Putnam, DO [place]
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
## 2020-07-08 DIAGNOSIS — G20 Parkinson's disease: Secondary | ICD-10-CM | POA: Diagnosis not present

## 2020-07-08 DIAGNOSIS — G250 Essential tremor: Secondary | ICD-10-CM | POA: Diagnosis not present

## 2020-07-12 ENCOUNTER — Telehealth: Payer: Medicare HMO

## 2020-07-15 DIAGNOSIS — N401 Enlarged prostate with lower urinary tract symptoms: Secondary | ICD-10-CM | POA: Diagnosis not present

## 2020-07-15 DIAGNOSIS — N5201 Erectile dysfunction due to arterial insufficiency: Secondary | ICD-10-CM | POA: Diagnosis not present

## 2020-07-15 DIAGNOSIS — E291 Testicular hypofunction: Secondary | ICD-10-CM | POA: Diagnosis not present

## 2020-07-15 DIAGNOSIS — N323 Diverticulum of bladder: Secondary | ICD-10-CM | POA: Diagnosis not present

## 2020-07-19 ENCOUNTER — Telehealth: Payer: Self-pay | Admitting: General Practice

## 2020-07-19 ENCOUNTER — Telehealth: Payer: Medicare HMO

## 2020-07-19 NOTE — Telephone Encounter (Signed)
  Chronic Care Management   Outreach Note  07/19/2020 Name: Todd Green MRN: [id number] DOB: 08-14-1950  Referred by: Verl Bangs, FNP Reason for referral : Appointment (RNCM: Initial outreach for Chronic Disease Management and Care Coordination Needs- 1st attempt)   An unsuccessful telephone outreach was attempted today. The patient was referred to the case management team for assistance with care management and care coordination.   Follow Up Plan: A HIPAA compliant phone message was left for the patient providing contact information and requesting a return call.   Noreene Larsson RN, MSN, JAARS [place] Mobile: [phone]

## 2020-07-21 ENCOUNTER — Telehealth: Payer: Medicare HMO | Admitting: Family Medicine

## 2020-07-21 ENCOUNTER — Telehealth: Payer: Self-pay

## 2020-07-21 ENCOUNTER — Telehealth: Payer: Self-pay | Admitting: Licensed Clinical Social Worker

## 2020-07-21 NOTE — Telephone Encounter (Signed)
  Chronic Care Management    Clinical Social Work General Follow Up Note  07/21/2020 Name: Marquail Bradwell MRN: [id number] DOB: 1950/06/04  Wilder Kurowski is a 70 y.o. year old male who is a primary care patient of Lorine Bears, Lupita Raider, FNP. The CCM team was consulted for assistance with Mental Health Counseling and Resources.   Review of patient status, including review of consultants reports, relevant laboratory and other test results, and collaboration with appropriate care team members and the patient's provider was performed as part of comprehensive patient evaluation and provision of chronic care management services.    LCSW completed CCM outreach attempt today but was unable to reach patient successfully. A HIPPA compliant voice message was left encouraging patient to return call once available. LCSW will ask Scheduling Care Guide to reschedule CCM SW appointment with patient as well.   Outpatient Encounter Medications as of 07/21/2020  Medication Sig  . busPIRone (BUSPAR) 5 MG tablet Take by mouth. (Patient not taking: Reported on 07/05/2020)  . carbidopa-levodopa (SINEMET IR) 25-100 MG tablet Take 1 tablet by mouth 3 (three) times daily.  . cetirizine (ZYRTEC) 10 MG tablet Take 1 tablet (10 mg total) by mouth daily. (Patient not taking: Reported on 07/05/2020)  . diclofenac Sodium (VOLTAREN) 1 % GEL Apply 2 g topically 4 (four) times daily.  . fluticasone (FLONASE) 50 MCG/ACT nasal spray 2 sprays by Each Nare route daily.  Marland Kitchen Hyoscyamine Sulfate SL (LEVSIN/SL) 0.125 MG SUBL Place 0.125 mg under the tongue every 4 (four) hours as needed (bladder spasms, urgency, frequency). 1-2 TABS  . levothyroxine (SYNTHROID) 137 MCG tablet TAKE 1 TABLET (137 MCG TOTAL) BY MOUTH DAILY BEFORE BREAKFAST (NEED MD APPOINTMENT) (Patient taking differently: Take 137 mcg by mouth daily before breakfast.)  . Meth-Hyo-M Bl-Na Phos-Ph Sal (URIBEL) 118 MG CAPS Take 1 capsule (118 mg total) by mouth every 6 (six) hours as  needed (dysuria).  . Multiple Vitamin (MULTI-VITAMIN) tablet Take 1 tablet by mouth daily.  Marland Kitchen omeprazole (PRILOSEC) 40 MG capsule Take 1 capsule (40 mg total) by mouth daily.  . tamsulosin (FLOMAX) 0.4 MG CAPS capsule Take 1 capsule (0.4 mg total) by mouth daily. (Patient not taking: Reported on 07/05/2020)  . Testosterone 20.25 MG/1.25GM (1.62%) GEL Apply 1 application topically daily.  Marland Kitchen triamcinolone ointment (KENALOG) 0.5 % Apply 1 application topically 2 (two) times daily. (Patient taking differently: Apply 1 application topically daily as needed (rash).)   No facility-administered encounter medications on file as of 07/21/2020.    Follow Up Plan: [facility] will reach out to patient to reschedule appointment.   Eula Fried, BSW, MSW, [email or web address] Phone: [phone]

## 2020-07-28 ENCOUNTER — Other Ambulatory Visit: Payer: Self-pay

## 2020-07-28 DIAGNOSIS — E890 Postprocedural hypothyroidism: Secondary | ICD-10-CM

## 2020-07-28 MED ORDER — LEVOTHYROXINE SODIUM 137 MCG PO TABS: ORAL_TABLET | ORAL | 1 refills | Status: DC

## 2020-07-29 ENCOUNTER — Telehealth: Payer: Medicare HMO | Admitting: General Practice

## 2020-07-29 ENCOUNTER — Ambulatory Visit (INDEPENDENT_AMBULATORY_CARE_PROVIDER_SITE_OTHER): Payer: Medicare HMO | Admitting: General Practice

## 2020-07-29 DIAGNOSIS — G20 Parkinson's disease: Secondary | ICD-10-CM | POA: Diagnosis not present

## 2020-07-29 DIAGNOSIS — R251 Tremor, unspecified: Secondary | ICD-10-CM

## 2020-07-29 DIAGNOSIS — M40202 Unspecified kyphosis, cervical region: Secondary | ICD-10-CM

## 2020-07-29 DIAGNOSIS — Z9181 History of falling: Secondary | ICD-10-CM

## 2020-07-29 DIAGNOSIS — E039 Hypothyroidism, unspecified: Secondary | ICD-10-CM

## 2020-07-29 DIAGNOSIS — M5030 Other cervical disc degeneration, unspecified cervical region: Secondary | ICD-10-CM

## 2020-07-29 DIAGNOSIS — F419 Anxiety disorder, unspecified: Secondary | ICD-10-CM

## 2020-07-29 DIAGNOSIS — S199XXA Unspecified injury of neck, initial encounter: Secondary | ICD-10-CM

## 2020-07-29 DIAGNOSIS — G250 Essential tremor: Secondary | ICD-10-CM

## 2020-07-29 NOTE — Chronic Care Management (AMB) (Signed)
Chronic Care Management   CCM RN Visit Note  07/29/2020 Name: Todd Green MRN: [id number] DOB: 23-Feb-1951  Subjective: Todd Green is a 70 y.o. year old male who is a primary care patient of Lorine Bears, Lupita Raider, FNP. The care management team was consulted for assistance with disease management and care coordination needs.    Engaged with patient by telephone for initial visit in response to provider referral for case management and/or care coordination services.   Consent to Services:  The patient was given the following information about Chronic Care Management services today, agreed to services, and gave verbal consent: 1. CCM service includes personalized support from designated clinical staff supervised by the primary care provider, including individualized plan of care and coordination with other care providers 2. 24/7 contact phone numbers for assistance for urgent and routine care needs. 3. Service will only be billed when office clinical staff spend 20 minutes or more in a month to coordinate care. 4. Only one practitioner may furnish and bill the service in a calendar month. 5.The patient may stop CCM services at any time (effective at the end of the month) by phone call to the office staff. 6. The patient will be responsible for cost sharing (co-pay) of up to 20% of the service fee (after annual deductible is met). Patient agreed to services and consent obtained.  Patient agreed to services and verbal consent obtained.   Assessment: Review of patient past medical history, allergies, medications, health status, including review of consultants reports, laboratory and other test data, was performed as part of comprehensive evaluation and provision of chronic care management services.   SDOH (Social Determinants of Health) assessments and interventions performed:    CCM Care Plan  Allergies  Allergen Reactions  . Allegra [Fexofenadine] Other (See Comments)    Redness in gums  . Sulfa  Antibiotics Rash    Outpatient Encounter Medications as of 07/29/2020  Medication Sig  . busPIRone (BUSPAR) 5 MG tablet Take by mouth. (Patient not taking: Reported on 07/05/2020)  . carbidopa-levodopa (SINEMET IR) 25-100 MG tablet Take 1 tablet by mouth 3 (three) times daily.  . cetirizine (ZYRTEC) 10 MG tablet Take 1 tablet (10 mg total) by mouth daily. (Patient not taking: Reported on 07/05/2020)  . diclofenac Sodium (VOLTAREN) 1 % GEL Apply 2 g topically 4 (four) times daily.  . fluticasone (FLONASE) 50 MCG/ACT nasal spray 2 sprays by Each Nare route daily.  Marland Kitchen Hyoscyamine Sulfate SL (LEVSIN/SL) 0.125 MG SUBL Place 0.125 mg under the tongue every 4 (four) hours as needed (bladder spasms, urgency, frequency). 1-2 TABS  . levothyroxine (SYNTHROID) 137 MCG tablet TAKE 1 TABLET (137 MCG TOTAL) BY MOUTH DAILY BEFORE BREAKFAST (NEED MD APPOINTMENT)  . Meth-Hyo-M Bl-Na Phos-Ph Sal (URIBEL) 118 MG CAPS Take 1 capsule (118 mg total) by mouth every 6 (six) hours as needed (dysuria).  . Multiple Vitamin (MULTI-VITAMIN) tablet Take 1 tablet by mouth daily.  Marland Kitchen omeprazole (PRILOSEC) 40 MG capsule Take 1 capsule (40 mg total) by mouth daily.  . tamsulosin (FLOMAX) 0.4 MG CAPS capsule Take 1 capsule (0.4 mg total) by mouth daily. (Patient not taking: Reported on 07/05/2020)  . Testosterone 20.25 MG/1.25GM (1.62%) GEL Apply 1 application topically daily.  Marland Kitchen triamcinolone ointment (KENALOG) 0.5 % Apply 1 application topically 2 (two) times daily. (Patient taking differently: Apply 1 application topically daily as needed (rash).)   No facility-administered encounter medications on file as of 07/29/2020.    Patient Active Problem List   Diagnosis  Date Noted  . Parkinson disease ([place]) 06/08/2020  . Dermatitis 04/13/2020  . Nasal congestion 03/10/2020  . Seasonal allergies 03/08/2020  . Constipation 06/24/2019  . Left hand pain 10/31/2017  . Benign essential tremor 10/31/2017  . Kyphosis of cervical region  02/03/2017  . Acquired hypothyroidism 02/02/2017  . Acid reflux 02/02/2017    Conditions to be addressed/monitored:Anxiety and hypothyroidism, Parkinson's disease, neck pain and limited mobility/Back pain   Care Plan : RNCM: Parkinson's Disease Management  Updates made by Vanita Ingles since 07/29/2020 12:00 AM    Problem: RNCM: Parkinson's Disease   Priority: Medium    Long-Range Goal: RNCM: Parklinson's Disease Management   Priority: Medium  Note:   Current Barriers:  Marland Kitchen Knowledge Deficits related to resources and support for new diagnosis of PD . Care Coordination needs related to PD support group in [facility]  in a patient with new diagnosis of PD . Chronic Disease Management support and education needs related to PD . Unable to independently manage PD . Lacks social connections . Does not contact provider office for questions/concerns  Nurse Case Manager Clinical Goal(s):  . patient will verbalize understanding of plan for effective management of PD . patient will work with [facility] and pcp to address needs related to PD . patient will work with care guides  (community agency) to find PD support and education in the community  Interventions:  . 1:1 collaboration with Malfi, Lupita Raider, FNP regarding development and update of comprehensive plan of care as evidenced by provider attestation and co-signature . Inter-disciplinary care team collaboration (see longitudinal plan of care) . Evaluation of current treatment plan related to Parkinson's Disease  and patient's adherence to plan as established by provider. . Advised patient to of the sx and sx of Parkinson's disease and advancement of the disease process. . Provided education to patient re: diagnosis of PD and how to effectively manage with other chronic conditions  . Reviewed medications with patient and discussed compliance  . Provided patient with PD  educational materials related to overview and support of patients with  PD . Care Guide referral for resources in the area for PD support and ongoing learning  . Discussed plans with patient for ongoing care management follow up and provided patient with direct contact information for care management team  Patient Goals/Self-Care Activities Over the next 120 days, patient will:  - Patient will self administer medications as prescribed Patient will attend all scheduled provider appointments Patient will call pharmacy for medication refills Patient will attend church or other social activities Patient will continue to perform ADL's independently Patient will continue to perform IADL's independently Patient will call provider office for new concerns or questions Patient will work with BSW to address care coordination needs and will continue to work with the clinical team to address health care and disease management related needs.    - barriers to meeting goals identified - change-talk evoked - choices provided - collaboration with team encouraged - decision-making supported - health risks reviewed - problem-solving facilitated - questions answered - readiness for change evaluated - reassurance provided - resources needed to meet goals identified - self-reflection promoted - self-reliance encouraged - verbalization of feelings encouraged Follow Up Plan: Telephone follow up appointment with care management team member scheduled for: 10-07-2020 at 1 pm        Task: RNCM: PD management   Note:   Care Management Activities:    - barriers to meeting goals identified - change-talk evoked -  choices provided - collaboration with team encouraged - decision-making supported - health risks reviewed - problem-solving facilitated - questions answered - readiness for change evaluated - reassurance provided - resources needed to meet goals identified - self-reflection promoted - self-reliance encouraged - verbalization of feelings encouraged        Care Plan : RNCM: Neck pain and limited mobility due to traumatic injury  Updates made by Vanita Ingles since 07/29/2020 12:00 AM    Problem: RNCM: Neck pain/back pain- limited mobility due to traumatic injury   Priority: High    Long-Range Goal: RNCM: Neck pain/limited mobility   Priority: High  Note:   Current Barriers:  Marland Kitchen Knowledge Deficits related to managing acute/chronic pain . Non-adherence to scheduled provider appointments . Non-adherence to prescribed medication regimen . Difficulty obtaining medications . Chronic Disease Management support and education needs related to chronic pain . Unable to independently manage neck pain and mobility/back pain . Lacks social connections . Does not maintain contact with provider office . Does not contact provider office for questions/concerns Nurse Case Manager Clinical Goal(s):  . patient will verbalize understanding of plan for managing pain . Patient will see specialist in May . patient will demonstrate use of different relaxation  skills and/or diversional activities to assist with pain reduction (distraction, imagery, relaxation, massage, acupressure, TENS, heat, and cold application . patient will report pain at a level less than 3 to 4 on a 10-10 rating scale . patient will use pharmacological and nonpharmacological pain relief strategies . patient will verbalize acceptable level of pain relief and ability to engage in desired activities . patient will engage in desired activities without an increase in pain level Interventions:  . Collaboration with Malfi, Lupita Raider, FNP regarding development and update of comprehensive plan of care as evidenced by provider attestation and co-signature . Inter-disciplinary care team collaboration (see longitudinal plan of care) . - careful application of heat or ice encouraged . - deep breathing, relaxation and mindfulness use promoted . - effectiveness of pharmacologic therapy monitored . -  medication-induced side effects managed . - misuse of pain medication assessed . - motivation and barriers to change assessed and addressed . - mutually acceptable comfort goal set . - pain assessed . - pain treatment goals reviewed . - participation in physical therapy encouraged . - participation in support group encouraged . - premedication prior to activity encouraged . Evaluation of current treatment plan related to neck pain/back pain and mobility  and patient's adherence to plan as established by provider. . Advised patient to call the office for changes in condition or worsening pain and discomfort  . Provided education to patient re: working with specialist, pacing activity, alternate pain relief measures, how often cortisone injections can be done, safety and preventing further injury . Reviewed medications with patient and discussed compliance . Discussed plans with patient for ongoing care management follow up and provided patient with direct contact information for care management team . Allow patient to maintain a diary of pain ratings, timing, precipitating events, medications, treatments, and what works best to relieve pain,  . Refer to support groups and self-help groups . Educate patient about the use of pharmacological interventions for pain management- antianxiety, antidepressants, NSAIDS, opioid analgesics,  . Explain the importance of lifestyle modifications to effective pain management  Patient Goals/Self Care Activities:  . - active listening utilized . - depression screen reviewed . - fear of pain acknowledged . - positive reinforcement provided . - problem-solving facilitated . -  relaxation techniques promoted . - self-reflection promoted . Self-administers medications as prescribed . Attends all scheduled provider appointments . Calls pharmacy for medication refills . Calls provider office for new concerns or questions Follow Up Plan: Telephone follow up  appointment with care management team member scheduled for:10-07-2020 at 1 pm     Task: RNCM: Neck pain and limited mobility/Back pain   Note:   Care Management Activities:    - active listening utilized - depression screen reviewed - fear of pain acknowledged - positive reinforcement provided - problem-solving facilitated - relaxation techniques promoted - self-reflection promoted       Care Plan : RNCM: Anxiety (Adult)  Updates made by Marlowe Sax since 07/29/2020 12:00 AM    Problem: RNCM: Anxiety Identification (Anxiety)   Priority: Medium    Long-Range Goal: RNCM: Anxiety Symptoms Identified   Priority: Medium  Note:   Current Barriers:  . Chronic Disease Management support and education needs related to anxiety  . Lacks caregiver support.  . Unable to independently anxiety  . Lacks social connections . Does not contact provider office for questions/concerns  Nurse Case Manager Clinical Goal(s):  . patient will verbalize understanding of plan for effective management of anxiety  . patient will work with RNCM/CCM team and pcp to address needs related to anxiety  . patient will demonstrate a decrease in anxiety exacerbations as evidenced by management of chronic conditions   Interventions:  . 1:1 collaboration with Malfi, Jodelle Gross, FNP regarding development and update of comprehensive plan of care as evidenced by provider attestation and co-signature . Inter-disciplinary care team collaboration (see longitudinal plan of care) . Evaluation of current treatment plan related to anxiety  and patient's adherence to plan as established by provider. . Advised patient to call the office for changes in mood/anxiety/ depressed state . Provided education to patient re: anxiety management  . Discussed plans with patient for ongoing care management follow up and provided patient with direct contact information for care management team  Patient Goals/Self-Care Activities Over the  next 120 days, patient will:  - Patient will self administer medications as prescribed Patient will attend all scheduled provider appointments Patient will call pharmacy for medication refills Patient will attend church or other social activities Patient will continue to perform ADL's independently Patient will continue to perform IADL's independently Patient will call provider office for new concerns or questions Patient will work with BSW to address care coordination needs and will continue to work with the clinical team to address health care and disease management related needs.   - anxiety screen reviewed - depression screen reviewed - somatic and anxiety symptoms correlated  Follow Up Plan: Telephone follow up appointment with care management team member scheduled for: 10-07-2020 at 1 pm      Task: RNCM: Identify Anxiety Symptoms and Facilitate Treatment   Note:   Care Management Activities:    - anxiety screen reviewed - depression screen reviewed - somatic and anxiety symptoms correlated       Care Plan : RNCM: Hypothyroidism  Updates made by Marlowe Sax since 07/29/2020 12:00 AM    Problem: RNCM: Hypothyroidism   Priority: Medium    Long-Range Goal: RNCM: Hypothyroidsim   Priority: Medium  Note:   Current Barriers:  . Chronic Disease Management support and education needs related to hypothyroidism  . Lacks social connections . Does not contact provider office for questions/concerns  Nurse Case Manager Clinical Goal(s):  . patient will verbalize understanding of plan  for effective management of hypothryoidism . patient will work with [place] and pcp  to address needs related to hypothyroidism  Interventions:  . 1:1 collaboration with Malfi, Lupita Raider, FNP regarding development and update of comprehensive plan of care as evidenced by provider attestation and co-signature . Inter-disciplinary care team collaboration (see longitudinal plan of care) . Evaluation of  current treatment plan related to hypothyroidism and patient's adherence to plan as established by provider. . Advised patient to call for changes or questions . Provided education to patient re: hypothyroidism and effective management  through emmi and my chart . Discussed plans with patient for ongoing care management follow up and provided patient with direct contact information for care management team  Patient Goals/Self-Care Activities Over the next 120 days, patient will:  - Patient will self administer medications as prescribed Patient will attend all scheduled provider appointments Patient will call pharmacy for medication refills Patient will attend church or other social activities Patient will continue to perform ADL's independently Patient will continue to perform IADL's independently Patient will call provider office for new concerns or questions Patient will work with BSW to address care coordination needs and will continue to work with the clinical team to address health care and disease management related needs.   - barriers to meeting goals identified - change-talk evoked - collaboration with team encouraged - decision-making supported - health risks reviewed - problem-solving facilitated - questions answered - readiness for change evaluated - reassurance provided - resources needed to meet goals identified - self-reflection promoted - self-reliance encouraged - verbalization of feelings encouraged  Follow Up Plan: Telephone follow up appointment with care management team member scheduled for: 10-07-2020 at 1 pm       Task: RNCM: Hypothyroidism   Note:   Care Management Activities:    - barriers to meeting goals identified - change-talk evoked - collaboration with team encouraged - decision-making supported - health risks reviewed - problem-solving facilitated - questions answered - readiness for change evaluated - reassurance provided - resources  needed to meet goals identified - self-reflection promoted - self-reliance encouraged - verbalization of feelings encouraged         Plan:Telephone follow up appointment with care management team member scheduled for:  10-07-2020 at 1 pm  [place], MSN, [place] Mobile: [phone]

## 2020-07-29 NOTE — Patient Instructions (Addendum)
Visit Information   PATIENT GOALS:  Patient Care Plan: General Social Work (Adult)    Problem Identified: Anxiety Identification (Anxiety)     Long-Range Goal: Anxiety Symptoms Identified   Start Date: 06/22/2020  Priority: Medium  Note:   Timeframe:  Long-Range Goal Priority:  Medium Start Date:  06/22/20                           Expected End Date:  09/22/20                     Follow Up Date- 07/21/20   - begin personal counseling - call and visit an old friend - check out volunteer opportunities - join a support group - laugh; watch a funny movie or comedian - learn and use visualization or guided imagery - perform a random act of kindness - practice relaxation or meditation daily - start or continue a personal journal - talk about feelings with a friend, family or spiritual advisor - practice positive thinking and self-talk    Why is this important?    When you are stressed, down or upset, your body reacts too.   For example, your blood pressure may get higher; you may have a headache or stomachache.   When your emotions get the best of you, your body's ability to fight off cold and flu gets weak.   These steps will help you manage your emotions.     CARE PLAN ENTRY (see longitudinal plan of care for additional care plan information)  Current Barriers:  . Patient with new diagnosis of Parkinson's Disease is in need of assistance with connection to community resources  . Knowledge deficits and need for support, education and care coordination related to community resources support  . Social Isolation and Limited access to caregiver  Clinical Goal(s)  . Over the next 120 days patient will implement healthy self-care as demonstrated by CCM LCSW . Over the next 120 days, patient will work with care management team member to gain education on new diagnosis, Parkinson's Disease.  . Over the next 120 days, patient will work with C3 Guide to address needs related to needing  railing inside his home to prevent falls.   Interventions provided by LCSW:  . Assessed patient's care coordination needs related to Parkinson's Disease and discussed ongoing care management follow up  . Provided patient with information about local mental health resources but patient denies needing this resource implementation just yet. Patient is having new anxiety and panic attacks symptoms since receiving new diagnosis of Parkinson's Disease in February of 2022. Patient reports that he does not know anything about this disorder and would like for CCM LCSW to make a referral to CCM RNCM for education and assistance with managing this new diagnosis.  . Patient reports that he has been exercising daily since he was 60. He reports that he just left the gym and was unable to finish his workout because he started experiencing anxiety. . Patient's next neurologist appointment is on 08/30/20 with Dr. Manuella Ghazi but patient wanted  to get a second opinion. NP referred patient to [name] neurology. He reports that he received one call from [facility] neurology already and is expecting an additional call in order to set up a new patient appointment. He was informed by [facility] that they would be able to see him within 3 weeks.  . Advised patient to implement healthy self-care and deep breathing exercises into  his daily routine to combat anxiety.  . Patient wishes to gain railing inside his home to prevent future falls. Nash Dimmer with appropriate clinical care team members regarding patient needs . Patient interviewed and appropriate assessments performed . Discussed plans with patient for ongoing care management follow up and provided patient with direct contact information for care management team . Assisted patient/caregiver with obtaining information about health plan benefits . Provided education and assistance to client regarding Advanced Directives. . Provided education to patient/caregiver regarding level of care  options. . Mindfulness or Relaxation Training, Emotional/Supportive Counseling, and Psychoeducation and/or Health Education provided during session . Care Guide referral made ([id number]) for assistance with fall prevention. Patient is in need of railings inside his home but declines needing a ramp.   Patient Self Care Activities & Deficits:  . Patient is unable to independently navigate community resource options without care coordination support  . Acknowledges deficits and is motivated to resolve concern  . Lacks social connections . Attends all scheduled provider appointments, Calls provider office for new concerns or questions, Ability for insight, Independent living, and Motivation for treatment  Initial goal documentation   Task: Identify Anxiety Symptoms and Facilitate Treatment     Patient Care Plan: RNCM: Parkinson's Disease Management    Problem Identified: RNCM: Parkinson's Disease   Priority: Medium    Long-Range Goal: RNCM: Parklinson's Disease Management   Priority: Medium  Note:   Current Barriers:  Marland Kitchen Knowledge Deficits related to resources and support for new diagnosis of PD . Care Coordination needs related to PD support group in [facility]  in a patient with new diagnosis of PD . Chronic Disease Management support and education needs related to PD . Unable to independently manage PD . Lacks social connections . Does not contact provider office for questions/concerns  Nurse Case Manager Clinical Goal(s):  . patient will verbalize understanding of plan for effective management of PD . patient will work with [facility] and pcp to address needs related to PD . patient will work with care guides  (community agency) to find PD support and education in the community  Interventions:  . 1:1 collaboration with Malfi, Lupita Raider, FNP regarding development and update of comprehensive plan of care as evidenced by provider attestation and co-signature . Inter-disciplinary care  team collaboration (see longitudinal plan of care) . Evaluation of current treatment plan related to Parkinson's Disease  and patient's adherence to plan as established by provider. . Advised patient to of the sx and sx of Parkinson's disease and advancement of the disease process. . Provided education to patient re: diagnosis of PD and how to effectively manage with other chronic conditions  . Reviewed medications with patient and discussed compliance  . Provided patient with PD  educational materials related to overview and support of patients with PD . Care Guide referral for resources in the area for PD support and ongoing learning  . Discussed plans with patient for ongoing care management follow up and provided patient with direct contact information for care management team  Patient Goals/Self-Care Activities Over the next 120 days, patient will:  - Patient will self administer medications as prescribed Patient will attend all scheduled provider appointments Patient will call pharmacy for medication refills Patient will attend church or other social activities Patient will continue to perform ADL's independently Patient will continue to perform IADL's independently Patient will call provider office for new concerns or questions Patient will work with BSW to address care coordination needs and will  continue to work with the clinical team to address health care and disease management related needs.    - barriers to meeting goals identified - change-talk evoked - choices provided - collaboration with team encouraged - decision-making supported - health risks reviewed - problem-solving facilitated - questions answered - readiness for change evaluated - reassurance provided - resources needed to meet goals identified - self-reflection promoted - self-reliance encouraged - verbalization of feelings encouraged Follow Up Plan: Telephone follow up appointment with care management team  member scheduled for: 10-07-2020 at 1 pm        Task: RNCM: PD management   Note:   Care Management Activities:    - barriers to meeting goals identified - change-talk evoked - choices provided - collaboration with team encouraged - decision-making supported - health risks reviewed - problem-solving facilitated - questions answered - readiness for change evaluated - reassurance provided - resources needed to meet goals identified - self-reflection promoted - self-reliance encouraged - verbalization of feelings encouraged       Patient Care Plan: RNCM: Neck pain and limited mobility due to traumatic injury    Problem Identified: RNCM: Neck pain/back pain- limited mobility due to traumatic injury   Priority: High    Long-Range Goal: RNCM: Neck pain/limited mobility   Priority: High  Note:   Current Barriers:  Marland Kitchen Knowledge Deficits related to managing acute/chronic pain . Non-adherence to scheduled provider appointments . Non-adherence to prescribed medication regimen . Difficulty obtaining medications . Chronic Disease Management support and education needs related to chronic pain . Unable to independently manage neck pain and mobility/back pain . Lacks social connections . Does not maintain contact with provider office . Does not contact provider office for questions/concerns Nurse Case Manager Clinical Goal(s):  . patient will verbalize understanding of plan for managing pain . Patient will see specialist in May . patient will demonstrate use of different relaxation  skills and/or diversional activities to assist with pain reduction (distraction, imagery, relaxation, massage, acupressure, TENS, heat, and cold application . patient will report pain at a level less than 3 to 4 on a 10-10 rating scale . patient will use pharmacological and nonpharmacological pain relief strategies . patient will verbalize acceptable level of pain relief and ability to engage in desired  activities . patient will engage in desired activities without an increase in pain level Interventions:  . Collaboration with Malfi, Lupita Raider, FNP regarding development and update of comprehensive plan of care as evidenced by provider attestation and co-signature . Inter-disciplinary care team collaboration (see longitudinal plan of care) . - careful application of heat or ice encouraged . - deep breathing, relaxation and mindfulness use promoted . - effectiveness of pharmacologic therapy monitored . - medication-induced side effects managed . - misuse of pain medication assessed . - motivation and barriers to change assessed and addressed . - mutually acceptable comfort goal set . - pain assessed . - pain treatment goals reviewed . - participation in physical therapy encouraged . - participation in support group encouraged . - premedication prior to activity encouraged . Evaluation of current treatment plan related to neck pain/back pain and mobility  and patient's adherence to plan as established by provider. . Advised patient to call the office for changes in condition or worsening pain and discomfort  . Provided education to patient re: working with specialist, pacing activity, alternate pain relief measures, how often cortisone injections can be done, safety and preventing further injury . Reviewed medications with patient and discussed  compliance . Discussed plans with patient for ongoing care management follow up and provided patient with direct contact information for care management team . Allow patient to maintain a diary of pain ratings, timing, precipitating events, medications, treatments, and what works best to relieve pain,  . Refer to support groups and self-help groups . Educate patient about the use of pharmacological interventions for pain management- antianxiety, antidepressants, NSAIDS, opioid analgesics,  . Explain the importance of lifestyle modifications to effective  pain management  Patient Goals/Self Care Activities:  . - active listening utilized . - depression screen reviewed . - fear of pain acknowledged . - positive reinforcement provided . - problem-solving facilitated . - relaxation techniques promoted . - self-reflection promoted . Self-administers medications as prescribed . Attends all scheduled provider appointments . Calls pharmacy for medication refills . Calls provider office for new concerns or questions Follow Up Plan: Telephone follow up appointment with care management team member scheduled for:10-07-2020 at 1 pm     Task: RNCM: Neck pain and limited mobility/Back pain   Note:   Care Management Activities:    - active listening utilized - depression screen reviewed - fear of pain acknowledged - positive reinforcement provided - problem-solving facilitated - relaxation techniques promoted - self-reflection promoted         Consent to CCM Services: Mr. Ketelsen was given information about Chronic Care Management services today including:  1. CCM service includes personalized support from designated clinical staff supervised by his physician, including individualized plan of care and coordination with other care providers 2. 24/7 contact phone numbers for assistance for urgent and routine care needs. 3. Service will only be billed when office clinical staff spend 20 minutes or more in a month to coordinate care. 4. Only one practitioner may furnish and bill the service in a calendar month. 5. The patient may stop CCM services at any time (effective at the end of the month) by phone call to the office staff. 6. The patient will be responsible for cost sharing (co-pay) of up to 20% of the service fee (after annual deductible is met).  Patient agreed to services and verbal consent obtained.   Patient verbalizes understanding of instructions provided today and agrees to view in [place].   Telephone follow up appointment with  care management team member scheduled for: 10-07-2020 at [place], MSN, [facility] Mobile: [phone]     Visit Information  Patient Care Plan: General Social Work (Adult)    Problem Identified: Anxiety Identification (Anxiety)     Long-Range Goal: Anxiety Symptoms Identified   Start Date: 06/22/2020  Priority: Medium  Note:   Timeframe:  Long-Range Goal Priority:  Medium Start Date:  06/22/20                           Expected End Date:  09/22/20                     Follow Up Date- 07/21/20   - begin personal counseling - call and visit an old friend - check out volunteer opportunities - join a support group - laugh; watch a funny movie or comedian - learn and use visualization or guided imagery - perform a random act of kindness - practice relaxation or meditation daily - start or continue a personal journal - talk about feelings with a friend, family  or spiritual advisor - practice positive thinking and self-talk    Why is this important?    When you are stressed, down or upset, your body reacts too.   For example, your blood pressure may get higher; you may have a headache or stomachache.   When your emotions get the best of you, your body's ability to fight off cold and flu gets weak.   These steps will help you manage your emotions.     CARE PLAN ENTRY (see longitudinal plan of care for additional care plan information)  Current Barriers:  . Patient with new diagnosis of Parkinson's Disease is in need of assistance with connection to community resources  . Knowledge deficits and need for support, education and care coordination related to community resources support  . Social Isolation and Limited access to caregiver  Clinical Goal(s)  . Over the next 120 days patient will implement healthy self-care as demonstrated by CCM LCSW . Over the next 120 days, patient  will work with care management team member to gain education on new diagnosis, Parkinson's Disease.  . Over the next 120 days, patient will work with [facility] Guide to address needs related to needing railing inside his home to prevent falls.   Interventions provided by LCSW:  . Assessed patient's care coordination needs related to Parkinson's Disease and discussed ongoing care management follow up  . Provided patient with information about local mental health resources but patient denies needing this resource implementation just yet. Patient is having new anxiety and panic attacks symptoms since receiving new diagnosis of Parkinson's Disease in February of 2022. Patient reports that he does not know anything about this disorder and would like for CCM LCSW to make a referral to CCM RNCM for education and assistance with managing this new diagnosis.  . Patient reports that he has been exercising daily since he was 6. He reports that he just left the gym and was unable to finish his workout because he started experiencing anxiety. . Patient's next neurologist appointment is on 08/30/20 with Dr. Manuella Ghazi but patient wanted  to get a second opinion. NP referred patient to [facility] neurology. He reports that he received one call from [facility] neurology already and is expecting an additional call in order to set up a new patient appointment. He was informed by [facility] that they would be able to see him within 3 weeks.  . Advised patient to implement healthy self-care and deep breathing exercises into his daily routine to combat anxiety.  . Patient wishes to gain railing inside his home to prevent future falls. Nash Dimmer with appropriate clinical care team members regarding patient needs . Patient interviewed and appropriate assessments performed . Discussed plans with patient for ongoing care management follow up and provided patient with direct contact information for care management team . Assisted patient/caregiver with  obtaining information about health plan benefits . Provided education and assistance to client regarding Advanced Directives. . Provided education to patient/caregiver regarding level of care options. . Mindfulness or Relaxation Training, Emotional/Supportive Counseling, and Psychoeducation and/or Health Education provided during session . Care Guide referral made ([id number]) for assistance with fall prevention. Patient is in need of railings inside his home but declines needing a ramp.   Patient Self Care Activities & Deficits:  . Patient is unable to independently navigate community resource options without care coordination support  . Acknowledges deficits and is motivated to resolve concern  . Lacks social connections . Attends all scheduled provider appointments, Calls  provider office for new concerns or questions, Ability for insight, Independent living, and Motivation for treatment  Initial goal documentation   Task: Identify Anxiety Symptoms and Facilitate Treatment     Patient Care Plan: RNCM: Parkinson's Disease Management    Problem Identified: RNCM: Parkinson's Disease   Priority: Medium    Long-Range Goal: RNCM: Parklinson's Disease Management   Priority: Medium  Note:   Current Barriers:  Marland Kitchen Knowledge Deficits related to resources and support for new diagnosis of PD . Care Coordination needs related to PD support group in [name]  in a patient with new diagnosis of PD . Chronic Disease Management support and education needs related to PD . Unable to independently manage PD . Lacks social connections . Does not contact provider office for questions/concerns  Nurse Case Manager Clinical Goal(s):  . patient will verbalize understanding of plan for effective management of PD . patient will work with [facility] and pcp to address needs related to PD . patient will work with care guides  (community agency) to find PD support and education in the community  Interventions:   . 1:1 collaboration with Malfi, Lupita Raider, FNP regarding development and update of comprehensive plan of care as evidenced by provider attestation and co-signature . Inter-disciplinary care team collaboration (see longitudinal plan of care) . Evaluation of current treatment plan related to Parkinson's Disease  and patient's adherence to plan as established by provider. . Advised patient to of the sx and sx of Parkinson's disease and advancement of the disease process. . Provided education to patient re: diagnosis of PD and how to effectively manage with other chronic conditions  . Reviewed medications with patient and discussed compliance  . Provided patient with PD  educational materials related to overview and support of patients with PD . Care Guide referral for resources in the area for PD support and ongoing learning  . Discussed plans with patient for ongoing care management follow up and provided patient with direct contact information for care management team  Patient Goals/Self-Care Activities Over the next 120 days, patient will:  - Patient will self administer medications as prescribed Patient will attend all scheduled provider appointments Patient will call pharmacy for medication refills Patient will attend church or other social activities Patient will continue to perform ADL's independently Patient will continue to perform IADL's independently Patient will call provider office for new concerns or questions Patient will work with BSW to address care coordination needs and will continue to work with the clinical team to address health care and disease management related needs.    - barriers to meeting goals identified - change-talk evoked - choices provided - collaboration with team encouraged - decision-making supported - health risks reviewed - problem-solving facilitated - questions answered - readiness for change evaluated - reassurance provided - resources needed to  meet goals identified - self-reflection promoted - self-reliance encouraged - verbalization of feelings encouraged Follow Up Plan: Telephone follow up appointment with care management team member scheduled for: 10-07-2020 at 1 pm        Task: RNCM: PD management   Note:   Care Management Activities:    - barriers to meeting goals identified - change-talk evoked - choices provided - collaboration with team encouraged - decision-making supported - health risks reviewed - problem-solving facilitated - questions answered - readiness for change evaluated - reassurance provided - resources needed to meet goals identified - self-reflection promoted - self-reliance encouraged - verbalization of feelings encouraged  Patient Care Plan: RNCM: Neck pain and limited mobility due to traumatic injury    Problem Identified: RNCM: Neck pain/back pain- limited mobility due to traumatic injury   Priority: High    Long-Range Goal: RNCM: Neck pain/limited mobility   Priority: High  Note:   Current Barriers:  Marland Kitchen Knowledge Deficits related to managing acute/chronic pain . Non-adherence to scheduled provider appointments . Non-adherence to prescribed medication regimen . Difficulty obtaining medications . Chronic Disease Management support and education needs related to chronic pain . Unable to independently manage neck pain and mobility/back pain . Lacks social connections . Does not maintain contact with provider office . Does not contact provider office for questions/concerns Nurse Case Manager Clinical Goal(s):  . patient will verbalize understanding of plan for managing pain . Patient will see specialist in May . patient will demonstrate use of different relaxation  skills and/or diversional activities to assist with pain reduction (distraction, imagery, relaxation, massage, acupressure, TENS, heat, and cold application . patient will report pain at a level less than 3 to 4 on a  10-10 rating scale . patient will use pharmacological and nonpharmacological pain relief strategies . patient will verbalize acceptable level of pain relief and ability to engage in desired activities . patient will engage in desired activities without an increase in pain level Interventions:  . Collaboration with Malfi, Lupita Raider, FNP regarding development and update of comprehensive plan of care as evidenced by provider attestation and co-signature . Inter-disciplinary care team collaboration (see longitudinal plan of care) . - careful application of heat or ice encouraged . - deep breathing, relaxation and mindfulness use promoted . - effectiveness of pharmacologic therapy monitored . - medication-induced side effects managed . - misuse of pain medication assessed . - motivation and barriers to change assessed and addressed . - mutually acceptable comfort goal set . - pain assessed . - pain treatment goals reviewed . - participation in physical therapy encouraged . - participation in support group encouraged . - premedication prior to activity encouraged . Evaluation of current treatment plan related to neck pain/back pain and mobility  and patient's adherence to plan as established by provider. . Advised patient to call the office for changes in condition or worsening pain and discomfort  . Provided education to patient re: working with specialist, pacing activity, alternate pain relief measures, how often cortisone injections can be done, safety and preventing further injury . Reviewed medications with patient and discussed compliance . Discussed plans with patient for ongoing care management follow up and provided patient with direct contact information for care management team . Allow patient to maintain a diary of pain ratings, timing, precipitating events, medications, treatments, and what works best to relieve pain,  . Refer to support groups and self-help groups . Educate patient  about the use of pharmacological interventions for pain management- antianxiety, antidepressants, NSAIDS, opioid analgesics,  . Explain the importance of lifestyle modifications to effective pain management  Patient Goals/Self Care Activities:  . - active listening utilized . - depression screen reviewed . - fear of pain acknowledged . - positive reinforcement provided . - problem-solving facilitated . - relaxation techniques promoted . - self-reflection promoted . Self-administers medications as prescribed . Attends all scheduled provider appointments . Calls pharmacy for medication refills . Calls provider office for new concerns or questions Follow Up Plan: Telephone follow up appointment with care management team member scheduled for:10-07-2020 at 1 pm     Task: RNCM: Neck pain and limited mobility/Back  pain   Note:   Care Management Activities:    - active listening utilized - depression screen reviewed - fear of pain acknowledged - positive reinforcement provided - problem-solving facilitated - relaxation techniques promoted - self-reflection promoted       Patient Care Plan: RNCM: Anxiety (Adult)    Problem Identified: RNCM: Anxiety Identification (Anxiety)   Priority: Medium    Long-Range Goal: RNCM: Anxiety Symptoms Identified   Priority: Medium  Note:   Current Barriers:  . Chronic Disease Management support and education needs related to anxiety  . Lacks caregiver support.  . Unable to independently anxiety  . Lacks social connections . Does not contact provider office for questions/concerns  Nurse Case Manager Clinical Goal(s):  . patient will verbalize understanding of plan for effective management of anxiety  . patient will work with RNCM/CCM team and pcp to address needs related to anxiety  . patient will demonstrate a decrease in anxiety exacerbations as evidenced by management of chronic conditions   Interventions:  . 1:1 collaboration with [place]alf[place],  Lupita Raider, FNP regarding development and update of comprehensive plan of care as evidenced by provider attestation and co-signature . Inter-disciplinary care team collaboration (see longitudinal plan of care) . Evaluation of current treatment plan related to anxiety  and patient's adherence to plan as established by provider. . Advised patient to call the office for changes in mood/anxiety/ depressed state . Provided education to patient re: anxiety management  . Discussed plans with patient for ongoing care management follow up and provided patient with direct contact information for care management team  Patient Goals/Self-Care Activities Over the next 120 days, patient will:  - Patient will self administer medications as prescribed Patient will attend all scheduled provider appointments Patient will call pharmacy for medication refills Patient will attend church or other social activities Patient will continue to perform ADL's independently Patient will continue to perform IADL's independently Patient will call provider office for new concerns or questions Patient will work with BSW to address care coordination needs and will continue to work with the clinical team to address health care and disease management related needs.   - anxiety screen reviewed - depression screen reviewed - somatic and anxiety symptoms correlated  Follow Up Plan: Telephone follow up appointment with care management team member scheduled for: 10-07-2020 at 1 pm      Task: RNCM: Identify Anxiety Symptoms and Facilitate Treatment   Note:   Care Management Activities:    - anxiety screen reviewed - depression screen reviewed - somatic and anxiety symptoms correlated       Patient Care Plan: RNCM: Hypothyroidism    Problem Identified: RNCM: Hypothyroidism   Priority: Medium    Long-Range Goal: RNCM: Hypothyroidsim   Priority: Medium  Note:   Current Barriers:  . Chronic Disease Management support and  education needs related to hypothyroidism  . Lacks social connections . Does not contact provider office for questions/concerns  Nurse Case Manager Clinical Goal(s):  . patient will verbalize understanding of plan for effective management of hypothryoidism . patient will work with [place] and pcp  to address needs related to hypothyroidism  Interventions:  . 1:1 collaboration with Malfi, Lupita Raider, FNP regarding development and update of comprehensive plan of care as evidenced by provider attestation and co-signature . Inter-disciplinary care team collaboration (see longitudinal plan of care) . Evaluation of current treatment plan related to hypothyroidism and patient's adherence to plan as established by provider. . Advised patient to call for  changes or questions . Provided education to patient re: hypothyroidism and effective management  through emmi and my chart . Discussed plans with patient for ongoing care management follow up and provided patient with direct contact information for care management team  Patient Goals/Self-Care Activities Over the next 120 days, patient will:  - Patient will self administer medications as prescribed Patient will attend all scheduled provider appointments Patient will call pharmacy for medication refills Patient will attend church or other social activities Patient will continue to perform ADL's independently Patient will continue to perform IADL's independently Patient will call provider office for new concerns or questions Patient will work with BSW to address care coordination needs and will continue to work with the clinical team to address health care and disease management related needs.   - barriers to meeting goals identified - change-talk evoked - collaboration with team encouraged - decision-making supported - health risks reviewed - problem-solving facilitated - questions answered - readiness for change evaluated - reassurance  provided - resources needed to meet goals identified - self-reflection promoted - self-reliance encouraged - verbalization of feelings encouraged  Follow Up Plan: Telephone follow up appointment with care management team member scheduled for: 10-07-2020 at 1 pm       Task: RNCM: Hypothyroidism   Note:   Care Management Activities:    - barriers to meeting goals identified - change-talk evoked - collaboration with team encouraged - decision-making supported - health risks reviewed - problem-solving facilitated - questions answered - readiness for change evaluated - reassurance provided - resources needed to meet goals identified - self-reflection promoted - self-reliance encouraged - verbalization of feelings encouraged         Patient verbalizes understanding of instructions provided today and agrees to view in [place].   Telephone follow up appointment with care management team member scheduled for: 10-07-2020 at 1 pm  Noreene Larsson RN, MSN, [facility] Mobile: [phone]   Tremor A tremor is trembling or shaking that you cannot control. Most tremors affect the hands or arms. Tremors can also affect the head, vocal cords, face, and other parts of the body. There are many types of tremors. Common types include:  Essential tremor. These usually occur in people older than 40. It may run in families and can happen in otherwise healthy people.  Resting tremor. These occur when the muscles are at rest, such as when your hands are resting in your lap. People with Parkinson's disease often have resting tremors.  Postural tremor. These occur when you try to hold a pose, such as keeping your hands outstretched.  Kinetic tremor. These occur during purposeful movement, such as trying to touch a finger to your nose.  Task-specific tremor. These may occur when you perform certain tasks such as  writing, speaking, or standing.  Psychogenic tremor. These dramatically lessen or disappear when you are distracted. They can happen in people of all ages. Some types of tremors have no known cause. Tremors can also be a symptom of nervous system problems (neurological disorders) that may occur with aging. Some tremors go away with treatment, while others do not. Follow these instructions at home: Lifestyle  Limit alcohol intake to no more than 1 drink a day for nonpregnant women and 2 drinks a day for men. One drink equals 12 oz of beer, 5 oz of wine, or 1 oz of hard liquor.  Do not use any products that contain  nicotine or tobacco, such as cigarettes and e-cigarettes. If you need help quitting, ask your health care provider.  Avoid extreme heat and extreme cold.  Limit your caffeine intake, as told by your health care provider.  Try to get 8 hours of sleep each night.  Find ways to manage your stress, such as meditation or yoga.      General instructions  Take over-the-counter and prescription medicines only as told by your health care provider.  Keep all follow-up visits as told by your health care provider. This is important. Contact a health care provider if you:  Develop a tremor after starting a new medicine.  Have a tremor along with other symptoms such as: ? Numbness. ? Tingling. ? Pain. ? Weakness.  Notice that your tremor gets worse.  Notice that your tremor interferes with your day-to-day life. Summary  A tremor is trembling or shaking that you cannot control.  Most tremors affect the hands or arms.  Some types of tremors have no known cause. Others may be a symptom of nervous system problems (neurological disorders).  Make sure you discuss any tremors you have with your health care provider. This information is not intended to replace advice given to you by your health care provider. Make sure you discuss any questions you have with your health care  provider. Document Revised: 01/02/2020 Document Reviewed: 01/02/2020 Elsevier Patient Education  2021 [place] Disease Parkinson's disease causes problems with movements. It is a long-term condition. It gets worse over time (is progressive). It affects each person in different ways. It makes it harder for you to:  Control how your body moves.  Move your body normally. The condition can range from mild to very bad (advanced). What are the causes? This condition results from a loss of brain cells called neurons. These brain cells make a chemical called dopamine, which is needed to control body movement. As the condition gets worse, the brain cells make less dopamine. This makes it hard to move or control your movements. The exact cause of this condition is not known. What increases the risk?  Being male.  Being age 37 or older.  Having family members who had Parkinson's disease.  Having had an injury to the brain.  Being very sad (depressed).  Being around things that are harmful or poisonous. What are the signs or symptoms? Symptoms of this condition can vary. The main symptoms have to do with movement. These include:  A tremor or shaking while you are resting that you cannot control.  Stiffness in your neck, arms, and legs.  Slowing of movement. This may include: ? Losing expressions of the face. ? Having trouble making small movements that are needed to button your clothing or brush your teeth.  Walking in a way that is not normal. You may walk with short, shuffling steps.  Loss of balance when standing. You may sway, fall backward, or have trouble making turns. Other symptoms include:  Being very sad, worried, or confused.  Seeing or hearing things that are not real.  Losing thinking abilities (dementia).  Trouble speaking or swallowing.  Having a hard time pooping (constipation).  Needing to pee right away, peeing often, or not being able to  control when you pee or poop.  Sleep problems.   How is this treated? There is no cure. The goal of treatment is to manage your symptoms. Treatment may include:  Medicines.  Therapy to help with talking or movement.  Surgery  to reduce shaking and other movements that you cannot control. Follow these instructions at home: Medicines  Take over-the-counter and prescription medicines only as told by your doctor.  Avoid taking pain or sleeping medicines. Eating and drinking  Follow instructions from your doctor about what you cannot eat or drink.  Do not drink alcohol. Activity  Talk with your doctor about if it is safe for you to drive.  Do exercises as told by your doctor. Lifestyle  Put in grab bars and railings in your home. These help to prevent falls.  Do not use any products that contain nicotine or tobacco, such as cigarettes, e-cigarettes, and chewing tobacco. If you need help quitting, ask your doctor.  Join a support group.      General instructions  Talk with your doctor about what you need help with and what your safety needs are.  Keep all follow-up visits as told by your doctor, including any therapy visits to help with talking or moving. This is important. Contact a doctor if:  Medicines do not help your symptoms.  You feel off-balance.  You fall at home.  You need more help at home.  You have trouble swallowing.  You have a very hard time pooping.  You have a lot of side effects from your medicines.  You feel very sad, worried, or confused. Get help right away if:  You were hurt in a fall.  You see or hear things that are not real.  You cannot swallow without choking.  You have chest pain or trouble breathing.  You do not feel safe at home.  You have thoughts about hurting yourself or others. If you ever feel like you may hurt yourself or others, or have thoughts about taking your own life, get help right away. You can go to your  nearest emergency department or call:  Your local emergency services (911 in the [place]  A suicide crisis helpline, such as the [place] at (662) 073-3121. This is open 24 hours a day. Summary  This condition causes problems with movements.  It is a long-term condition. It gets worse over time.  There is no cure. Treatment focuses on managing your symptoms.  Talk with your doctor about what you need help with and what your safety needs are.  Keep all follow-up visits as told by your doctor. This is important. This information is not intended to replace advice given to you by your health care provider. Make sure you discuss any questions you have with your health care provider. Document Revised: 06/27/2018 Document Reviewed: 06/27/2018 Elsevier Patient Education  2021 [place].  Hypothyroidism  Hypothyroidism is when the thyroid gland does not make enough of certain hormones (it is underactive). The thyroid gland is a small gland located in the lower front part of the neck, just in front of the windpipe (trachea). This gland makes hormones that help control how the body uses food for energy (metabolism) as well as how the heart and brain function. These hormones also play a role in keeping your bones strong. When the thyroid is underactive, it produces too little of the hormones thyroxine (T4) and triiodothyronine (T3). What are the causes? This condition may be caused by:  Hashimoto's disease. This is a disease in which the body's disease-fighting system (immune system) attacks the thyroid gland. This is the most common cause.  Viral infections.  Pregnancy.  Certain medicines.  Birth defects.  Past radiation treatments to the head or neck  for cancer.  Past treatment with radioactive iodine.  Past exposure to radiation in the environment.  Past surgical removal of part or all of the thyroid.  Problems with a gland in the center of the brain  (pituitary gland).  Lack of enough iodine in the diet. What increases the risk? You are more likely to develop this condition if:  You are male.  You have a family history of thyroid conditions.  You use a medicine called lithium.  You take medicines that affect the immune system (immunosuppressants). What are the signs or symptoms? Symptoms of this condition include:  Feeling as though you have no energy (lethargy).  Not being able to tolerate cold.  Weight gain that is not explained by a change in diet or exercise habits.  Lack of appetite.  Dry skin.  Coarse hair.  Menstrual irregularity.  Slowing of thought processes.  Constipation.  Sadness or depression. How is this diagnosed? This condition may be diagnosed based on:  Your symptoms, your medical history, and a physical exam.  Blood tests. You may also have imaging tests, such as an ultrasound or MRI. How is this treated? This condition is treated with medicine that replaces the thyroid hormones that your body does not make. After you begin treatment, it may take several weeks for symptoms to go away. Follow these instructions at home:  Take over-the-counter and prescription medicines only as told by your health care provider.  If you start taking any new medicines, tell your health care provider.  Keep all follow-up visits as told by your health care provider. This is important. ? As your condition improves, your dosage of thyroid hormone medicine may change. ? You will need to have blood tests regularly so that your health care provider can monitor your condition. Contact a health care provider if:  Your symptoms do not get better with treatment.  You are taking thyroid hormone replacement medicine and you: ? Sweat a lot. ? Have tremors. ? Feel anxious. ? Lose weight rapidly. ? Cannot tolerate heat. ? Have emotional swings. ? Have diarrhea. ? Feel weak. Get help right away if you  have:  Chest pain.  An irregular heartbeat.  A rapid heartbeat.  Difficulty breathing. Summary  Hypothyroidism is when the thyroid gland does not make enough of certain hormones (it is underactive).  When the thyroid is underactive, it produces too little of the hormones thyroxine (T4) and triiodothyronine (T3).  The most common cause is Hashimoto's disease, a disease in which the body's disease-fighting system (immune system) attacks the thyroid gland. The condition can also be caused by viral infections, medicine, pregnancy, or past radiation treatment to the head or neck.  Symptoms may include weight gain, dry skin, constipation, feeling as though you do not have energy, and not being able to tolerate cold.  This condition is treated with medicine to replace the thyroid hormones that your body does not make. This information is not intended to replace advice given to you by your health care provider. Make sure you discuss any questions you have with your health care provider. Document Revised: 01/09/2020 Document Reviewed: 12/25/2019 Elsevier Patient Education  2021 [employer].

## 2020-07-29 NOTE — Chronic Care Management (AMB) (Signed)
Chronic Care Management   CCM RN Visit Note  07/29/2020 Name: Todd Green MRN: [id number] DOB: 23-Feb-1951  Subjective: Todd Green is a 70 y.o. year old male who is a primary care patient of Lorine Bears, Lupita Raider, FNP. The care management team was consulted for assistance with disease management and care coordination needs.    Engaged with patient by telephone for initial visit in response to provider referral for case management and/or care coordination services.   Consent to Services:  The patient was given the following information about Chronic Care Management services today, agreed to services, and gave verbal consent: 1. CCM service includes personalized support from designated clinical staff supervised by the primary care provider, including individualized plan of care and coordination with other care providers 2. 24/7 contact phone numbers for assistance for urgent and routine care needs. 3. Service will only be billed when office clinical staff spend 20 minutes or more in a month to coordinate care. 4. Only one practitioner may furnish and bill the service in a calendar month. 5.The patient may stop CCM services at any time (effective at the end of the month) by phone call to the office staff. 6. The patient will be responsible for cost sharing (co-pay) of up to 20% of the service fee (after annual deductible is met). Patient agreed to services and consent obtained.  Patient agreed to services and verbal consent obtained.   Assessment: Review of patient past medical history, allergies, medications, health status, including review of consultants reports, laboratory and other test data, was performed as part of comprehensive evaluation and provision of chronic care management services.   SDOH (Social Determinants of Health) assessments and interventions performed:    CCM Care Plan  Allergies  Allergen Reactions  . Allegra [Fexofenadine] Other (See Comments)    Redness in gums  . Sulfa  Antibiotics Rash    Outpatient Encounter Medications as of 07/29/2020  Medication Sig  . busPIRone (BUSPAR) 5 MG tablet Take by mouth. (Patient not taking: Reported on 07/05/2020)  . carbidopa-levodopa (SINEMET IR) 25-100 MG tablet Take 1 tablet by mouth 3 (three) times daily.  . cetirizine (ZYRTEC) 10 MG tablet Take 1 tablet (10 mg total) by mouth daily. (Patient not taking: Reported on 07/05/2020)  . diclofenac Sodium (VOLTAREN) 1 % GEL Apply 2 g topically 4 (four) times daily.  . fluticasone (FLONASE) 50 MCG/ACT nasal spray 2 sprays by Each Nare route daily.  Marland Kitchen Hyoscyamine Sulfate SL (LEVSIN/SL) 0.125 MG SUBL Place 0.125 mg under the tongue every 4 (four) hours as needed (bladder spasms, urgency, frequency). 1-2 TABS  . levothyroxine (SYNTHROID) 137 MCG tablet TAKE 1 TABLET (137 MCG TOTAL) BY MOUTH DAILY BEFORE BREAKFAST (NEED MD APPOINTMENT)  . Meth-Hyo-M Bl-Na Phos-Ph Sal (URIBEL) 118 MG CAPS Take 1 capsule (118 mg total) by mouth every 6 (six) hours as needed (dysuria).  . Multiple Vitamin (MULTI-VITAMIN) tablet Take 1 tablet by mouth daily.  Marland Kitchen omeprazole (PRILOSEC) 40 MG capsule Take 1 capsule (40 mg total) by mouth daily.  . tamsulosin (FLOMAX) 0.4 MG CAPS capsule Take 1 capsule (0.4 mg total) by mouth daily. (Patient not taking: Reported on 07/05/2020)  . Testosterone 20.25 MG/1.25GM (1.62%) GEL Apply 1 application topically daily.  Marland Kitchen triamcinolone ointment (KENALOG) 0.5 % Apply 1 application topically 2 (two) times daily. (Patient taking differently: Apply 1 application topically daily as needed (rash).)   No facility-administered encounter medications on file as of 07/29/2020.    Patient Active Problem List   Diagnosis  Date Noted  . Parkinson disease ([place]) 06/08/2020  . Dermatitis 04/13/2020  . Nasal congestion 03/10/2020  . Seasonal allergies 03/08/2020  . Constipation 06/24/2019  . Left hand pain 10/31/2017  . Benign essential tremor 10/31/2017  . Kyphosis of cervical region  02/03/2017  . Acquired hypothyroidism 02/02/2017  . Acid reflux 02/02/2017    Conditions to be addressed/monitored:Anxiety and Chronic neck pain/back pain, Parkinson's Disease   Care Plan : RNCM: Parkinson's Disease Management  Updates made by Vanita Ingles since 07/29/2020 12:00 AM    Problem: RNCM: Parkinson's Disease   Priority: Medium    Long-Range Goal: RNCM: Parklinson's Disease Management   Priority: Medium  Note:   Current Barriers:  Marland Kitchen Knowledge Deficits related to resources and support for new diagnosis of PD . Care Coordination needs related to PD support group in [name]  in a patient with new diagnosis of PD . Chronic Disease Management support and education needs related to PD . Unable to independently manage PD . Lacks social connections . Does not contact provider office for questions/concerns  Nurse Case Manager Clinical Goal(s):  . patient will verbalize understanding of plan for effective management of PD . patient will work with [facility] and pcp to address needs related to PD . patient will work with care guides  (community agency) to find PD support and education in the community  Interventions:  . 1:1 collaboration with Malfi, Lupita Raider, FNP regarding development and update of comprehensive plan of care as evidenced by provider attestation and co-signature . Inter-disciplinary care team collaboration (see longitudinal plan of care) . Evaluation of current treatment plan related to Parkinson's Disease  and patient's adherence to plan as established by provider. . Advised patient to of the sx and sx of Parkinson's disease and advancement of the disease process. . Provided education to patient re: diagnosis of PD and how to effectively manage with other chronic conditions  . Reviewed medications with patient and discussed compliance  . Provided patient with PD  educational materials related to overview and support of patients with PD . Care Guide referral for  resources in the area for PD support and ongoing learning  . Discussed plans with patient for ongoing care management follow up and provided patient with direct contact information for care management team  Patient Goals/Self-Care Activities Over the next 120 days, patient will:  - Patient will self administer medications as prescribed Patient will attend all scheduled provider appointments Patient will call pharmacy for medication refills Patient will attend church or other social activities Patient will continue to perform ADL's independently Patient will continue to perform IADL's independently Patient will call provider office for new concerns or questions Patient will work with BSW to address care coordination needs and will continue to work with the clinical team to address health care and disease management related needs.    - barriers to meeting goals identified - change-talk evoked - choices provided - collaboration with team encouraged - decision-making supported - health risks reviewed - problem-solving facilitated - questions answered - readiness for change evaluated - reassurance provided - resources needed to meet goals identified - self-reflection promoted - self-reliance encouraged - verbalization of feelings encouraged Follow Up Plan: Telephone follow up appointment with care management team member scheduled for: 10-07-2020 at 1 pm        Task: RNCM: PD management   Note:   Care Management Activities:    - barriers to meeting goals identified - change-talk evoked - choices provided -  collaboration with team encouraged - decision-making supported - health risks reviewed - problem-solving facilitated - questions answered - readiness for change evaluated - reassurance provided - resources needed to meet goals identified - self-reflection promoted - self-reliance encouraged - verbalization of feelings encouraged       Care Plan : RNCM: Neck pain and  limited mobility due to traumatic injury  Updates made by Vanita Ingles since 07/29/2020 12:00 AM    Problem: RNCM: Neck pain/back pain- limited mobility due to traumatic injury   Priority: High    Long-Range Goal: RNCM: Neck pain/limited mobility   Priority: High  Note:   Current Barriers:  Marland Kitchen Knowledge Deficits related to managing acute/chronic pain . Non-adherence to scheduled provider appointments . Non-adherence to prescribed medication regimen . Difficulty obtaining medications . Chronic Disease Management support and education needs related to chronic pain . Unable to independently manage neck pain and mobility/back pain . Lacks social connections . Does not maintain contact with provider office . Does not contact provider office for questions/concerns Nurse Case Manager Clinical Goal(s):  . patient will verbalize understanding of plan for managing pain . Patient will see specialist in May . patient will demonstrate use of different relaxation  skills and/or diversional activities to assist with pain reduction (distraction, imagery, relaxation, massage, acupressure, TENS, heat, and cold application . patient will report pain at a level less than 3 to 4 on a 10-10 rating scale . patient will use pharmacological and nonpharmacological pain relief strategies . patient will verbalize acceptable level of pain relief and ability to engage in desired activities . patient will engage in desired activities without an increase in pain level Interventions:  . Collaboration with Malfi, Lupita Raider, FNP regarding development and update of comprehensive plan of care as evidenced by provider attestation and co-signature . Inter-disciplinary care team collaboration (see longitudinal plan of care) . - careful application of heat or ice encouraged . - deep breathing, relaxation and mindfulness use promoted . - effectiveness of pharmacologic therapy monitored . - medication-induced side effects  managed . - misuse of pain medication assessed . - motivation and barriers to change assessed and addressed . - mutually acceptable comfort goal set . - pain assessed . - pain treatment goals reviewed . - participation in physical therapy encouraged . - participation in support group encouraged . - premedication prior to activity encouraged . Evaluation of current treatment plan related to neck pain/back pain and mobility  and patient's adherence to plan as established by provider. . Advised patient to call the office for changes in condition or worsening pain and discomfort  . Provided education to patient re: working with specialist, pacing activity, alternate pain relief measures, how often cortisone injections can be done, safety and preventing further injury . Reviewed medications with patient and discussed compliance . Discussed plans with patient for ongoing care management follow up and provided patient with direct contact information for care management team . Allow patient to maintain a diary of pain ratings, timing, precipitating events, medications, treatments, and what works best to relieve pain,  . Refer to support groups and self-help groups . Educate patient about the use of pharmacological interventions for pain management- antianxiety, antidepressants, NSAIDS, opioid analgesics,  . Explain the importance of lifestyle modifications to effective pain management  Patient Goals/Self Care Activities:  . - active listening utilized . - depression screen reviewed . - fear of pain acknowledged . - positive reinforcement provided . - problem-solving facilitated . - relaxation techniques  promoted . - self-reflection promoted . Self-administers medications as prescribed . Attends all scheduled provider appointments . Calls pharmacy for medication refills . Calls provider office for new concerns or questions Follow Up Plan: Telephone follow up appointment with care management team  member scheduled for:10-07-2020 at 1 pm     Task: RNCM: Neck pain and limited mobility/Back pain   Note:   Care Management Activities:    - active listening utilized - depression screen reviewed - fear of pain acknowledged - positive reinforcement provided - problem-solving facilitated - relaxation techniques promoted - self-reflection promoted       Care Plan : RNCM: Anxiety (Adult)  Updates made by Vanita Ingles since 07/29/2020 12:00 AM    Problem: RNCM: Anxiety Identification (Anxiety)   Priority: Medium    Long-Range Goal: RNCM: Anxiety Symptoms Identified   Priority: Medium  Note:   Current Barriers:  . Chronic Disease Management support and education needs related to anxiety  . Lacks caregiver support.  . Unable to independently anxiety  . Lacks social connections . Does not contact provider office for questions/concerns  Nurse Case Manager Clinical Goal(s):  . patient will verbalize understanding of plan for effective management of anxiety  . patient will work with RNCM/CCM team and pcp to address needs related to anxiety  . patient will demonstrate a decrease in anxiety exacerbations as evidenced by management of chronic conditions   Interventions:  . 1:1 collaboration with Malfi, Lupita Raider, FNP regarding development and update of comprehensive plan of care as evidenced by provider attestation and co-signature . Inter-disciplinary care team collaboration (see longitudinal plan of care) . Evaluation of current treatment plan related to anxiety  and patient's adherence to plan as established by provider. . Advised patient to call the office for changes in mood/anxiety/ depressed state . Provided education to patient re: anxiety management  . Discussed plans with patient for ongoing care management follow up and provided patient with direct contact information for care management team  Patient Goals/Self-Care Activities Over the next 120 days, patient will:  -  Patient will self administer medications as prescribed Patient will attend all scheduled provider appointments Patient will call pharmacy for medication refills Patient will attend church or other social activities Patient will continue to perform ADL's independently Patient will continue to perform IADL's independently Patient will call provider office for new concerns or questions Patient will work with BSW to address care coordination needs and will continue to work with the clinical team to address health care and disease management related needs.   - anxiety screen reviewed - depression screen reviewed - somatic and anxiety symptoms correlated  Follow Up Plan: Telephone follow up appointment with care management team member scheduled for: 10-07-2020 at 1 pm      Task: RNCM: Identify Anxiety Symptoms and Facilitate Treatment   Note:   Care Management Activities:    - anxiety screen reviewed - depression screen reviewed - somatic and anxiety symptoms correlated         Plan:Telephone follow up appointment with care management team member scheduled for:  10-07-2020 at 1 pm  [place], MSN, [place] Mobile: [phone]

## 2020-07-29 NOTE — Chronic Care Management (AMB) (Deleted)
Chronic Care Management   CCM RN Visit Note  07/29/2020 Name: Todd Green MRN: [id number] DOB: 23-Feb-1951  Subjective: Todd Green is a 70 y.o. year old male who is a primary care patient of Todd Green, Todd Raider, FNP. The care management team was consulted for assistance with disease management and care coordination needs.    Engaged with patient by telephone for initial visit in response to provider referral for case management and/or care coordination services.   Consent to Services:  The patient was given the following information about Chronic Care Management services today, agreed to services, and gave verbal consent: 1. CCM service includes personalized support from designated clinical staff supervised by the primary care provider, including individualized plan of care and coordination with other care providers 2. 24/7 contact phone numbers for assistance for urgent and routine care needs. 3. Service will only be billed when office clinical staff spend 20 minutes or more in a month to coordinate care. 4. Only one practitioner may furnish and bill the service in a calendar month. 5.The patient may stop CCM services at any time (effective at the end of the month) by phone call to the office staff. 6. The patient will be responsible for cost sharing (co-pay) of up to 20% of the service fee (after annual deductible is met). Patient agreed to services and consent obtained.  Patient agreed to services and verbal consent obtained.   Assessment: Review of patient past medical history, allergies, medications, health status, including review of consultants reports, laboratory and other test data, was performed as part of comprehensive evaluation and provision of chronic care management services.   SDOH (Social Determinants of Health) assessments and interventions performed:    CCM Care Plan  Allergies  Allergen Reactions  . Allegra [Fexofenadine] Other (See Comments)    Redness in gums  . Sulfa  Antibiotics Rash    Outpatient Encounter Medications as of 07/29/2020  Medication Sig  . busPIRone (BUSPAR) 5 MG tablet Take by mouth. (Patient not taking: Reported on 07/05/2020)  . carbidopa-levodopa (SINEMET IR) 25-100 MG tablet Take 1 tablet by mouth 3 (three) times daily.  . cetirizine (ZYRTEC) 10 MG tablet Take 1 tablet (10 mg total) by mouth daily. (Patient not taking: Reported on 07/05/2020)  . diclofenac Sodium (VOLTAREN) 1 % GEL Apply 2 g topically 4 (four) times daily.  . fluticasone (FLONASE) 50 MCG/ACT nasal spray 2 sprays by Each Nare route daily.  Marland Kitchen Hyoscyamine Sulfate SL (LEVSIN/SL) 0.125 MG SUBL Place 0.125 mg under the tongue every 4 (four) hours as needed (bladder spasms, urgency, frequency). 1-2 TABS  . levothyroxine (SYNTHROID) 137 MCG tablet TAKE 1 TABLET (137 MCG TOTAL) BY MOUTH DAILY BEFORE BREAKFAST (NEED MD APPOINTMENT)  . Meth-Hyo-M Bl-Na Phos-Ph Sal (URIBEL) 118 MG CAPS Take 1 capsule (118 mg total) by mouth every 6 (six) hours as needed (dysuria).  . Multiple Vitamin (MULTI-VITAMIN) tablet Take 1 tablet by mouth daily.  Marland Kitchen omeprazole (PRILOSEC) 40 MG capsule Take 1 capsule (40 mg total) by mouth daily.  . tamsulosin (FLOMAX) 0.4 MG CAPS capsule Take 1 capsule (0.4 mg total) by mouth daily. (Patient not taking: Reported on 07/05/2020)  . Testosterone 20.25 MG/1.25GM (1.62%) GEL Apply 1 application topically daily.  Marland Kitchen triamcinolone ointment (KENALOG) 0.5 % Apply 1 application topically 2 (two) times daily. (Patient taking differently: Apply 1 application topically daily as needed (rash).)   No facility-administered encounter medications on file as of 07/29/2020.    Patient Active Problem List   Diagnosis  Date Noted  . Parkinson disease ([place]) 06/08/2020  . Dermatitis 04/13/2020  . Nasal congestion 03/10/2020  . Seasonal allergies 03/08/2020  . Constipation 06/24/2019  . Left hand pain 10/31/2017  . Benign essential tremor 10/31/2017  . Kyphosis of cervical region  02/03/2017  . Acquired hypothyroidism 02/02/2017  . Acid reflux 02/02/2017    Conditions to be addressed/monitored:Parkinson's Disease, Limited mobility, neck and back pain from traumic injury   Care Plan : RNCM: Parkinson's Disease Management  Updates made by Vanita Ingles since 07/29/2020 12:00 AM    Problem: RNCM: Parkinson's Disease   Priority: Medium    Long-Range Goal: RNCM: Parklinson's Disease Management   Priority: Medium  Note:   Current Barriers:  Marland Kitchen Knowledge Deficits related to resources and support for new diagnosis of PD . Care Coordination needs related to PD support group in [facility]  in a patient with new diagnosis of PD . Chronic Disease Management support and education needs related to PD . Unable to independently manage PD . Lacks social connections . Does not contact provider office for questions/concerns  Nurse Case Manager Clinical Goal(s):  . patient will verbalize understanding of plan for effective management of PD . patient will work with [facility] and pcp to address needs related to PD . patient will work with care guides  (community agency) to find PD support and education in the community  Interventions:  . 1:1 collaboration with Malfi, Todd Raider, FNP regarding development and update of comprehensive plan of care as evidenced by provider attestation and co-signature . Inter-disciplinary care team collaboration (see longitudinal plan of care) . Evaluation of current treatment plan related to Parkinson's Disease  and patient's adherence to plan as established by provider. . Advised patient to of the sx and sx of Parkinson's disease and advancement of the disease process. . Provided education to patient re: diagnosis of PD and how to effectively manage with other chronic conditions  . Reviewed medications with patient and discussed compliance  . Provided patient with PD  educational materials related to overview and support of patients with PD . Care  Guide referral for resources in the area for PD support and ongoing learning  . Discussed plans with patient for ongoing care management follow up and provided patient with direct contact information for care management team  Patient Goals/Self-Care Activities Over the next 120 days, patient will:  - Patient will self administer medications as prescribed Patient will attend all scheduled provider appointments Patient will call pharmacy for medication refills Patient will attend church or other social activities Patient will continue to perform ADL's independently Patient will continue to perform IADL's independently Patient will call provider office for new concerns or questions Patient will work with BSW to address care coordination needs and will continue to work with the clinical team to address health care and disease management related needs.    - barriers to meeting goals identified - change-talk evoked - choices provided - collaboration with team encouraged - decision-making supported - health risks reviewed - problem-solving facilitated - questions answered - readiness for change evaluated - reassurance provided - resources needed to meet goals identified - self-reflection promoted - self-reliance encouraged - verbalization of feelings encouraged Follow Up Plan: Telephone follow up appointment with care management team member scheduled for: 10-07-2020 at 1 pm        Task: RNCM: PD management   Note:   Care Management Activities:    - barriers to meeting goals identified - change-talk evoked -  choices provided - collaboration with team encouraged - decision-making supported - health risks reviewed - problem-solving facilitated - questions answered - readiness for change evaluated - reassurance provided - resources needed to meet goals identified - self-reflection promoted - self-reliance encouraged - verbalization of feelings encouraged       Care Plan :  RNCM: Neck pain and limited mobility due to traumatic injury  Updates made by Vanita Ingles since 07/29/2020 12:00 AM    Problem: RNCM: Neck pain/back pain- limited mobility due to traumatic injury   Priority: High    Long-Range Goal: RNCM: Neck pain/limited mobility   Priority: High  Note:   Current Barriers:  Marland Kitchen Knowledge Deficits related to managing acute/chronic pain . Non-adherence to scheduled provider appointments . Non-adherence to prescribed medication regimen . Difficulty obtaining medications . Chronic Disease Management support and education needs related to chronic pain . Unable to independently manage neck pain and mobility/back pain . Lacks social connections . Does not maintain contact with provider office . Does not contact provider office for questions/concerns Nurse Case Manager Clinical Goal(s):  . patient will verbalize understanding of plan for managing pain . Patient will see specialist in May . patient will demonstrate use of different relaxation  skills and/or diversional activities to assist with pain reduction (distraction, imagery, relaxation, massage, acupressure, TENS, heat, and cold application . patient will report pain at a level less than 3 to 4 on a 10-10 rating scale . patient will use pharmacological and nonpharmacological pain relief strategies . patient will verbalize acceptable level of pain relief and ability to engage in desired activities . patient will engage in desired activities without an increase in pain level Interventions:  . Collaboration with Malfi, Todd Raider, FNP regarding development and update of comprehensive plan of care as evidenced by provider attestation and co-signature . Inter-disciplinary care team collaboration (see longitudinal plan of care) . - careful application of heat or ice encouraged . - deep breathing, relaxation and mindfulness use promoted . - effectiveness of pharmacologic therapy monitored . -  medication-induced side effects managed . - misuse of pain medication assessed . - motivation and barriers to change assessed and addressed . - mutually acceptable comfort goal set . - pain assessed . - pain treatment goals reviewed . - participation in physical therapy encouraged . - participation in support group encouraged . - premedication prior to activity encouraged . Evaluation of current treatment plan related to neck pain/back pain and mobility  and patient's adherence to plan as established by provider. . Advised patient to call the office for changes in condition or worsening pain and discomfort  . Provided education to patient re: working with specialist, pacing activity, alternate pain relief measures, how often cortisone injections can be done, safety and preventing further injury . Reviewed medications with patient and discussed compliance . Discussed plans with patient for ongoing care management follow up and provided patient with direct contact information for care management team . Allow patient to maintain a diary of pain ratings, timing, precipitating events, medications, treatments, and what works best to relieve pain,  . Refer to support groups and self-help groups . Educate patient about the use of pharmacological interventions for pain management- antianxiety, antidepressants, NSAIDS, opioid analgesics,  . Explain the importance of lifestyle modifications to effective pain management  Patient Goals/Self Care Activities:  . - active listening utilized . - depression screen reviewed . - fear of pain acknowledged . - positive reinforcement provided . - problem-solving facilitated . -  relaxation techniques promoted . - self-reflection promoted . Self-administers medications as prescribed . Attends all scheduled provider appointments . Calls pharmacy for medication refills . Calls provider office for new concerns or questions Follow Up Plan: Telephone follow up  appointment with care management team member scheduled for:10-07-2020 at 1 pm     Task: RNCM: Neck pain and limited mobility/Back pain   Note:   Care Management Activities:    - active listening utilized - depression screen reviewed - fear of pain acknowledged - positive reinforcement provided - problem-solving facilitated - relaxation techniques promoted - self-reflection promoted         Plan:Telephone follow up appointment with care management team member scheduled for:  10-07-2020 at 1 pm  [place], MSN, [place] Mobile: [phone]

## 2020-07-30 ENCOUNTER — Telehealth: Payer: Self-pay | Admitting: Family Medicine

## 2020-07-30 ENCOUNTER — Telehealth: Payer: Self-pay

## 2020-07-30 ENCOUNTER — Other Ambulatory Visit: Payer: Self-pay

## 2020-07-30 NOTE — Telephone Encounter (Signed)
Need more clarification on what he needs the letter to say.  It sounds like the landlord / property has some sort of policy or protocol.  It does not sound like his dog is an actual service animal.  I am not quite following what he needs [place] to write in the letter.  Todd Green, [facility] 07/30/2020, 4:52 PM

## 2020-07-30 NOTE — Telephone Encounter (Signed)
   Telephone encounter was:  Successful.  07/30/2020 Name: Todd Green MRN: [id number] DOB: [birth year]  Todd Green is a 70 y.o. year old male who is a primary care patient of Lorine Bears, Lupita Raider, FNP . The community resource team was consulted for assistance with Home Modifications and information on Parkinson's Disease  Care guide performed the following interventions: Patient provided with information about care guide support team and interviewed to confirm resource needs Discussed resources to assist with home modifications, however patient said that he is going to move to a different home with more handicap accessories. He said that he didn't know about Parkinson's disease and that there are different types. He said that he has to educate himself with research. At this time he doesn't want or need any of our services. He says he benches over 140 pounds.  .  Follow Up Plan:  Client will do his own research and will see other specialists as he needs to.  and No further follow up planned at this time. The patient has been provided with needed resources.  [name], Care Management Phone: [phone] Email: [email or web address]

## 2020-07-30 NOTE — Chronic Care Management (AMB) (Signed)
  Care Management   Note  07/30/2020 Name: Jarren Para MRN: [id number] DOB: 05-16-1950  Champ Keetch is a 70 y.o. year old male who is a primary care patient of Lorine Bears, Lupita Raider, [place] and is actively engaged with the care management team. I reached out to [name] by phone today to assist with re-scheduling an initial visit with the Licensed Clinical Social Worker  Follow up plan: Unsuccessful telephone outreach attempt made. A HIPAA compliant phone message was left for the patient providing contact information and requesting a return call.  The care management team will reach out to the patient again over the next 7 days.  If patient returns call to provider office, please advise to call Shannon  at [place], [place], [place], [place] [address] Direct Dial: [phone] [email or web address]: [email or web address]

## 2020-07-30 NOTE — Telephone Encounter (Signed)
Copied from [place] [id number]. Topic: General - Other >> Jul 30, 2020  3:46 PM Mcneil, Ja-Kwan wrote: Reason for CRM: Pt stated he has a dog and he needs a letter from the doctor to give to his landlord. Pt stated he has Parkinson's disease and his daughter is away a lot so the dog is helpful if he was to fall.

## 2020-08-04 ENCOUNTER — Telehealth: Payer: Self-pay

## 2020-08-04 ENCOUNTER — Telehealth: Payer: Self-pay | Admitting: Family Medicine

## 2020-08-04 NOTE — Telephone Encounter (Signed)
   Telephone encounter was:  Unsuccessful.  08/04/2020 Name: Todd Green MRN: [id number] DOB: 1950/08/09  Unsuccessful outbound call made today to assist with:  Home Modifications  Outreach Attempt:  1st Attempt  A HIPAA compliant voice message was left requesting a return call.  Instructed patient to call back at [phone].  Note to [employer] giving explanation as to why we cannot provide a resource for rails for his home:   Jerene Pitch,  This patient informed me that he was looking for a new home. Any programs that we have for resources to modify the home for safety expect the patient to live there the rest of their lives; the whole point of the modifications are to make a patient safe in there home so that they can STAY in their home. They are trying to avoid people making modifications and then selling the home for a better price. They actually put a lien on their home so that in the one off that they try and sell the home before they pay back the loan over time, the monies will come back to the organization that did the home modifications. The patient doesn't actually pay back the loan per say for every year that they are in the home, the loan goes down by a percentage until it is complete.   I will call [name] and let him know this, but I thought you should know this information as well.   Thank you for all that you do for our patients.   [place], Care Management  Phone: [phone]  Email: [email or web address]

## 2020-08-04 NOTE — Telephone Encounter (Signed)
I reached out to the patient to get some clarity on his request for a letter from Dr. Raliegh Ip. He informed me that he no longer need that letter. He did request an update on the side rails for his porch. I sent a secure message to Eula Fried, LCSW to see if she can call the patient to give him an update.

## 2020-08-04 NOTE — Telephone Encounter (Signed)
-----   Message from Leafy Ro sent at 08/04/2020 11:29 AM EDT ----- Jerene Pitch,  This patient informed me that he was looking for a new home. Any programs that we have for resources to modify the home for safety expect the patient to live there the rest of their lives; the whole point of the modifications are to make a patient safe in there home so that they can STAY in their home. They are trying to avoid people making modifications and then selling the home for a better price. They actually put a lien on their home so that in the one off that they try and sell the home before they pay back the loan over time, the monies will come back to the organization that did the home modifications. The patient doesn't actually pay back the loan per say for every year that they are in the home, the loan goes down by a percentage until it is complete.   I will call Todd Green and let him know this, but I thought you should know this information as well.   Thank you for all that you do for our patients.  [place], Care Management Phone: [phone] Email: [email or web address]  ----- Message ----- From: Alyce Pagan Sent: 08/04/2020   9:13 AM EDT To: Wilson Singer, [place], Richardean Sale!! Todd Green called today asking about were things stand with getting his rails for his porch. Can you reach back out to this patient regarding this referral? He is still wanting some follow up. I see you closed the referral already but was hoping you could reach back out to him regarding this concern.  Thank you!  Eula Fried, BSW, MSW, [email or web address] Phone: [phone]

## 2020-08-12 NOTE — Chronic Care Management (AMB) (Signed)
  Care Management   Note  08/12/2020 Name: Ronnald Shedden MRN: [id number] DOB: 06-07-1950  Todd Green is a 70 y.o. year old male who is a primary care patient of Malfi, Lupita Raider, [place] and is actively engaged with the care management team. I reached out to [name] by phone today to assist with re-scheduling a follow up visit with the Licensed Clinical Social Worker  Follow up plan: Telephone appointment with care management team member scheduled for:08/18/2020  Noreene Larsson, [place], [place], [place] [address] Direct Dial: [phone] [email or web address]: [email or web address]

## 2020-08-18 ENCOUNTER — Ambulatory Visit: Payer: Medicare HMO | Admitting: Licensed Clinical Social Worker

## 2020-08-18 DIAGNOSIS — E039 Hypothyroidism, unspecified: Secondary | ICD-10-CM

## 2020-08-18 DIAGNOSIS — F419 Anxiety disorder, unspecified: Secondary | ICD-10-CM

## 2020-08-18 DIAGNOSIS — R251 Tremor, unspecified: Secondary | ICD-10-CM

## 2020-08-18 DIAGNOSIS — G20 Parkinson's disease: Secondary | ICD-10-CM

## 2020-08-18 DIAGNOSIS — Z9181 History of falling: Secondary | ICD-10-CM

## 2020-08-18 DIAGNOSIS — G250 Essential tremor: Secondary | ICD-10-CM

## 2020-08-19 NOTE — Patient Instructions (Signed)
Visit Information  Goals Addressed            This Visit's Progress   . SW-Manage My Emotions       Timeframe:  Long-Range Goal Priority:  Medium Start Date:  06/22/20                           Expected End Date:  10/21/20                     Patient Goals/Self-Care Activities: Over the next 60 days . Attend upcoming appointments . Continue to exercise at least 2 to 3 times per week . Practice positive thinking and self-talk       Patient verbalizes understanding of instructions provided today.  Telephone follow up appointment with care management team member scheduled for: May 11, [place], MSW, [email or web address] Phone [phone] 12:09 PM

## 2020-08-19 NOTE — Chronic Care Management (AMB) (Signed)
Chronic Care Management    Clinical Social Work Note  08/19/2020 Name: Todd Green MRN: [id number] DOB: [birth year]  [name] is a 70 y.o. year old male who is a primary care patient of Todd Green, Todd Raider, FNP. The CCM team was consulted to assist the patient with chronic disease management and/or care coordination needs related to: Mental Health Counseling and Resources.   Engaged with patient by telephone for follow up visit in response to provider referral for social work chronic care management and care coordination services.   Consent to Services:  The patient was given information about Chronic Care Management services, agreed to services, and gave verbal consent prior to initiation of services.  Please see initial visit note for detailed documentation.   Patient agreed to services and consent obtained.   Assessment: Patient is engaged in conversation, continues to maintain positive progress with care plan goals.Reports significant decrease in panic attacks and has been utilizing healthy coping skills to promote management of anxiety symptoms. See Care Plan below for interventions and patient self-care actives. Recent life changes /stressors: Diagnoses of medical condition and housing needs Recommendation: Patient may benefit from, and is in agreement to continue healthy coping skills and strategies discussed.  Follow up Plan: Patient would like continued follow-up.  CCM LCSW will follow up with patient 09/01/20. Patient will call office if needed prior to next encounter.        SDOH (Social Determinants of Health) assessments and interventions performed:    Advanced Directives Status: Not addressed in this encounter.  CCM Care Plan  Allergies  Allergen Reactions  . Allegra [Fexofenadine] Other (See Comments)    Redness in gums  . Sulfa Antibiotics Rash    Outpatient Encounter Medications as of 08/18/2020  Medication Sig  . busPIRone (BUSPAR) 5 MG tablet Take by  mouth. (Patient not taking: Reported on 07/05/2020)  . carbidopa-levodopa (SINEMET IR) 25-100 MG tablet Take 1 tablet by mouth 3 (three) times daily.  . cetirizine (ZYRTEC) 10 MG tablet Take 1 tablet (10 mg total) by mouth daily. (Patient not taking: Reported on 07/05/2020)  . diclofenac Sodium (VOLTAREN) 1 % GEL Apply 2 g topically 4 (four) times daily.  . fluticasone (FLONASE) 50 MCG/ACT nasal spray 2 sprays by Each Nare route daily.  Marland Kitchen Hyoscyamine Sulfate SL (LEVSIN/SL) 0.125 MG SUBL Place 0.125 mg under the tongue every 4 (four) hours as needed (bladder spasms, urgency, frequency). 1-2 TABS  . levothyroxine (SYNTHROID) 137 MCG tablet TAKE 1 TABLET (137 MCG TOTAL) BY MOUTH DAILY BEFORE BREAKFAST (NEED MD APPOINTMENT)  . Meth-Hyo-M Bl-Na Phos-Ph Sal (URIBEL) 118 MG CAPS Take 1 capsule (118 mg total) by mouth every 6 (six) hours as needed (dysuria).  . Multiple Vitamin (MULTI-VITAMIN) tablet Take 1 tablet by mouth daily.  Marland Kitchen omeprazole (PRILOSEC) 40 MG capsule Take 1 capsule (40 mg total) by mouth daily.  . tamsulosin (FLOMAX) 0.4 MG CAPS capsule Take 1 capsule (0.4 mg total) by mouth daily. (Patient not taking: Reported on 07/05/2020)  . Testosterone 20.25 MG/1.25GM (1.62%) GEL Apply 1 application topically daily.  Marland Kitchen triamcinolone ointment (KENALOG) 0.5 % Apply 1 application topically 2 (two) times daily. (Patient taking differently: Apply 1 application topically daily as needed (rash).)   No facility-administered encounter medications on file as of 08/18/2020.    Patient Active Problem List   Diagnosis Date Noted  . Parkinson disease ([place]) 06/08/2020  . Dermatitis 04/13/2020  . Nasal congestion 03/10/2020  . Seasonal allergies 03/08/2020  . Constipation 06/24/2019  .  Left hand pain 10/31/2017  . Benign essential tremor 10/31/2017  . Kyphosis of cervical region 02/03/2017  . Acquired hypothyroidism 02/02/2017  . Acid reflux 02/02/2017    Conditions to be addressed/monitored: Anxiety;  Mental Health Concerns   Care Plan : General Social Work (Adult)  Updates made by Christa See D, LCSW since 08/19/2020 12:00 AM    Problem: Anxiety     Long-Range Goal: Management of Anxiety Symptoms   Start Date: 06/22/2020  This Visit's Progress: On track  Priority: Medium  Note:   Current barriers:   . Chronic Mental Health needs related to Anxiety  Needs Support, Education, and Care Coordination in order to meet unmet mental health needs Clinical Goal(s): Over the next 60 days, patient will work with SW to reduce or manage symptoms of anxiety and stress and increase knowledge and/or ability of: coping skills and stress reduction Clinical Interventions:  . Assessed patient's previous treatment, needs, coping skills, current treatment, support system and barriers to care  .  brief mental health assessment;Suicidal Ideation/Homicidal Ideation: No  . Pt reports anxiety was negatively impacted from initial consult with Neurologist who diagnosed him without appropriate context. Since then, pt has obtained a second opinion with a Neurologist with a speciality in Movement Disorder through [facility]. This provider diagnosed pt with a tremor predominant parkinson's disease that is identified as very mild which provided context and comfort . Pt reports significant decrease in panic attacks since learning about his condition; however, he continues to have difficulty managing chronic neck pain which limits his mobility. Pt has discussed concerns with PCP and endorses feelings of frustration that his office does not administer Cortisone or B12 injections, which were effective treatment in the past. Per pt, the ointment prescribed is not working and has an odor . Pt has an upcoming appt with an orthopedic next week (Dr. Arbie Cookey) on May 6, 22 to discuss treatment plan for pain; however, he is concerned about the finances associated with multiple visits to providers . Pt is requesting rails for outside home to  promote safety. LCSW plans to collaborate with Care Guide to obtain information on resources to assist with patient needs, as he is renting current home and is unaware of how long he will remain at that residence.  . Pt visits the gym regularly to manage stress and promote health/well-being. He has a strong support system that consists of adult daughter and girlfriend . Other interventions: Active listening / Reflection utilized , Emotional Supportive Provided, and Problem [place]  ; . Collaboration with PCP regarding development and update of comprehensive plan of care as evidenced by provider attestation and co-signature . Inter-disciplinary care team collaboration (see longitudinal plan of care) Patient Goals/Self-Care Activities: Over the next 60 days . Attend upcoming appointments . Continue to exercise at least 2 to 3 times per week . Practice positive thinking and self-talk       Christa See, MSW, [place] [facility] Management [email or web address] Phone [phone] 12:08 PM

## 2020-08-27 DIAGNOSIS — M542 Cervicalgia: Secondary | ICD-10-CM | POA: Diagnosis not present

## 2020-08-31 DIAGNOSIS — M542 Cervicalgia: Secondary | ICD-10-CM | POA: Diagnosis not present

## 2020-09-01 ENCOUNTER — Ambulatory Visit (INDEPENDENT_AMBULATORY_CARE_PROVIDER_SITE_OTHER): Payer: Medicare HMO | Admitting: Licensed Clinical Social Worker

## 2020-09-01 DIAGNOSIS — F331 Major depressive disorder, recurrent, moderate: Secondary | ICD-10-CM | POA: Diagnosis not present

## 2020-09-01 DIAGNOSIS — F419 Anxiety disorder, unspecified: Secondary | ICD-10-CM

## 2020-09-01 DIAGNOSIS — G250 Essential tremor: Secondary | ICD-10-CM

## 2020-09-01 DIAGNOSIS — M79642 Pain in left hand: Secondary | ICD-10-CM

## 2020-09-01 DIAGNOSIS — Z7189 Other specified counseling: Secondary | ICD-10-CM

## 2020-09-01 DIAGNOSIS — G20 Parkinson's disease: Secondary | ICD-10-CM

## 2020-09-01 NOTE — Chronic Care Management (AMB) (Signed)
Chronic Care Management    Clinical Social Work Note  09/01/2020 Name: Todd Green MRN: [id number] DOB: April 16, 1951  Todd Green is a 70 y.o. year old male who is a primary care patient of Lorine Bears, Lupita Raider, FNP. The CCM team was consulted to assist the patient with chronic disease management and/or care coordination needs related to: Mental Health Counseling and Resources.   Engaged with patient by telephone for follow up visit in response to provider referral for social work chronic care management and care coordination services.   Consent to Services:  The patient was given information about Chronic Care Management services, agreed to services, and gave verbal consent prior to initiation of services.  Please see initial visit note for detailed documentation.   Patient agreed to services and consent obtained.   Assessment: Patient is engaged in conversation, continues to maintain positive progress with care plan goals. Patient reports decrease in anxiety symptoms with ongoing follow up of chronic neck pain and mobility concerns. Pt continues to follow through with scheduled appointments. CCM LCSW has completed a referral to Care Guide to assist with safety railings to promote safety. See Care Plan below for interventions and patient self-care actives. Recent life changes [name]: Safety concerns and Chronic Pain  Recommendation: Patient may benefit from, and is in agreement to continue following up with providers and utilizing strategies to assist in management of anxiety symptoms.  Follow up Plan: Patient would like continued follow-up.  CCM LCSW will follow up with patient on 11/03/20. Patient will call office if needed prior to next encounter.  SDOH (Social Determinants of Health) assessments and interventions performed:    Advanced Directives Status: Not addressed in this encounter.  CCM Care Plan  Allergies  Allergen Reactions  . Allegra [Fexofenadine] Other (See Comments)     Redness in gums  . Sulfa Antibiotics Rash    Outpatient Encounter Medications as of 09/01/2020  Medication Sig  . busPIRone (BUSPAR) 5 MG tablet Take by mouth. (Patient not taking: Reported on 07/05/2020)  . carbidopa-levodopa (SINEMET IR) 25-100 MG tablet Take 1 tablet by mouth 3 (three) times daily.  . cetirizine (ZYRTEC) 10 MG tablet Take 1 tablet (10 mg total) by mouth daily. (Patient not taking: Reported on 07/05/2020)  . diclofenac Sodium (VOLTAREN) 1 % GEL Apply 2 g topically 4 (four) times daily.  . fluticasone (FLONASE) 50 MCG/ACT nasal spray 2 sprays by Each Nare route daily.  Marland Kitchen Hyoscyamine Sulfate SL (LEVSIN/SL) 0.125 MG SUBL Place 0.125 mg under the tongue every 4 (four) hours as needed (bladder spasms, urgency, frequency). 1-2 TABS  . levothyroxine (SYNTHROID) 137 MCG tablet TAKE 1 TABLET (137 MCG TOTAL) BY MOUTH DAILY BEFORE BREAKFAST (NEED MD APPOINTMENT)  . Meth-Hyo-M Bl-Na Phos-Ph Sal (URIBEL) 118 MG CAPS Take 1 capsule (118 mg total) by mouth every 6 (six) hours as needed (dysuria).  . Multiple Vitamin (MULTI-VITAMIN) tablet Take 1 tablet by mouth daily.  Marland Kitchen omeprazole (PRILOSEC) 40 MG capsule Take 1 capsule (40 mg total) by mouth daily.  . tamsulosin (FLOMAX) 0.4 MG CAPS capsule Take 1 capsule (0.4 mg total) by mouth daily. (Patient not taking: Reported on 07/05/2020)  . Testosterone 20.25 MG/1.25GM (1.62%) GEL Apply 1 application topically daily.  Marland Kitchen triamcinolone ointment (KENALOG) 0.5 % Apply 1 application topically 2 (two) times daily. (Patient taking differently: Apply 1 application topically daily as needed (rash).)   No facility-administered encounter medications on file as of 09/01/2020.    Patient Active Problem List   Diagnosis Date  Noted  . Parkinson disease ([place]) 06/08/2020  . Dermatitis 04/13/2020  . Nasal congestion 03/10/2020  . Seasonal allergies 03/08/2020  . Constipation 06/24/2019  . Left hand pain 10/31/2017  . Benign essential tremor 10/31/2017  .  Kyphosis of cervical region 02/03/2017  . Acquired hypothyroidism 02/02/2017  . Acid reflux 02/02/2017    Conditions to be addressed/monitored: Anxiety; Mental Health Concerns   Care Plan : General Social Work (Adult)  Updates made by Christa See D, LCSW since 09/01/2020 12:00 AM    Problem: Anxiety     Long-Range Goal: Management of Anxiety Symptoms   Start Date: 06/22/2020  Recent Progress: On track  Priority: Medium  Note:   Current barriers:   . Chronic Mental Health needs related to Anxiety  Needs Support, Education, and Care Coordination in order to meet unmet mental health needs Clinical Goal(s): Over the next 60 days, patient will work with SW to reduce or manage symptoms of anxiety and stress and increase knowledge and/or ability of: coping skills and stress reduction Clinical Interventions:  . Assessed patient's previous treatment, needs, coping skills, current treatment, support system and barriers to care  .  brief mental health assessment;Suicidal Ideation/Homicidal Ideation: No  . Pt reports significant decrease in panic attacks since learning about his condition; however, he continues to have difficulty managing chronic neck pain which limits his mobility. Patient has scheduled an appointment with Ortho for June 2022 to discuss treatment plan for left hand tremors . Pt completed appt with an orthopedic, Dr. Arbie Cookey, May 6, 22 and discussed treatment plan for ongoing neck pain and mobility concerns. A MRI was completed to determine appropriate treatment plan and a follow up appointment was scheduled. Pt is hopeful that symptoms will be managed in the near future . Pt is requesting rails for outside home to promote safety. LCSW collaborated with Care Guide, Claretta Fraise, to obtain appropriate resources to assist with patient needs to promote safety. Patient was informed that he must remain in the home to obtain assistance or there may be money owed to agency. Pt verbalized  understanding and provided consent for CCM LCSW to complete new referral to Care Guide  . Pt continues to visit the gym regularly to manage stress and promote health/well-being. He has a strong support system that consists of adult daughter and girlfriend . Other interventions: Active listening / Reflection utilized , Emotional Supportive Provided, and Problem [place]  ; . Collaboration with PCP regarding development and update of comprehensive plan of care as evidenced by provider attestation and co-signature . Inter-disciplinary care team collaboration (see longitudinal plan of care) Patient Goals/Self-Care Activities: Over the next 60 days . Attend upcoming appointments . Continue to exercise at least 2 to 3 times per week . Practice positive thinking and self-talk        Christa See, MSW, [place] [facility] Management [email or web address] Phone [phone] 10:12 AM

## 2020-09-01 NOTE — Patient Instructions (Signed)
Visit Information  Goals Addressed            This Visit's Progress   . SW-Manage My Emotions   On track    Timeframe:  Long-Range Goal Priority:  Medium Start Date:  06/22/20                           Expected End Date:  11/20/20                     Patient Goals/Self-Care Activities: Over the next 60 days . Attend upcoming appointments . Continue to exercise at least 2 to 3 times per week . Practice positive thinking and self-talk       Patient verbalizes understanding of instructions provided today and agrees to view in [place].   Telephone follow up appointment with care management team member scheduled for:11/03/20  Christa See, MSW, [email or web address] Phone [phone] 10:14 AM

## 2020-09-03 DIAGNOSIS — M542 Cervicalgia: Secondary | ICD-10-CM | POA: Diagnosis not present

## 2020-09-06 ENCOUNTER — Telehealth: Payer: Self-pay | Admitting: Family Medicine

## 2020-09-06 NOTE — Telephone Encounter (Signed)
   Telephone encounter was:  Successful.  09/06/2020 Name: Faraaz Wolin MRN: [id number] DOB: 1950/09/29  Etan Vasudevan is a 70 y.o. year old male who is a primary care patient of Lorine Bears, Lupita Raider, FNP . The community resource team was consulted for assistance with Home Modifications  Care guide performed the following interventions: Patient provided with information about care guide support team and interviewed to confirm resource needs Discussed resources to assist with modifications to his home. Pt explained that he rents, but he talked to the landlord and he said that he agreed to the home modifications. I sent an email to the pt with the information on the different programs that we know about. Pt will follow up with them and filll out the applications going forward. .  Follow Up Plan:  No further follow up planned at this time. The patient has been provided with needed resources.  [place], Care Management Phone: [phone] Email: [email or web address]

## 2020-09-28 ENCOUNTER — Ambulatory Visit (INDEPENDENT_AMBULATORY_CARE_PROVIDER_SITE_OTHER): Payer: Medicare HMO

## 2020-09-28 VITALS — Ht 69.5 in | Wt 172.0 lb

## 2020-09-28 DIAGNOSIS — Z0000 Encounter for general adult medical examination without abnormal findings: Secondary | ICD-10-CM | POA: Diagnosis not present

## 2020-09-28 DIAGNOSIS — M79642 Pain in left hand: Secondary | ICD-10-CM | POA: Diagnosis not present
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
## 2020-09-28 NOTE — Patient Instructions (Signed)
Todd Green , Thank you for taking time to come for your Medicare Wellness Visit. I appreciate your ongoing commitment to your health goals. Please review the following plan we discussed and let me know if I can assist you in the future.   Screening recommendations/referrals: Colonoscopy: cologuard 06/21/2019, due 06/20/2022 Recommended yearly ophthalmology/optometry visit for glaucoma screening and checkup Recommended yearly dental visit for hygiene and checkup  Vaccinations: Influenza vaccine: completed 02/05/2020, due 11/22/2020 Pneumococcal vaccine: completed 09/11/2014 Tdap vaccine: completed 02/03/2019, due 02/02/2029 Shingles vaccine: completed per patient   Covid-19:  12/25/2019, 08/05/2019, 07/03/2019  Advanced directives: Advance directive discussed with you today.   Conditions/risks identified: none  Next appointment: Follow up in one year for your annual wellness visit.   Preventive Care 87 Years and Older, Male Preventive care refers to lifestyle choices and visits with your health care provider that can promote health and wellness. What does preventive care include?  A yearly physical exam. This is also called an annual well check.  Dental exams once or twice a year.  Routine eye exams. Ask your health care provider how often you should have your eyes checked.  Personal lifestyle choices, including:  Daily care of your teeth and gums.  Regular physical activity.  Eating a healthy diet.  Avoiding tobacco and drug use.  Limiting alcohol use.  Practicing safe sex.  Taking low doses of aspirin every day.  Taking vitamin and mineral supplements as recommended by your health care provider. What happens during an annual well check? The services and screenings done by your health care provider during your annual well check will depend on your age, overall health, lifestyle risk factors, and family history of disease. Counseling  Your health care provider may ask you  questions about your:  Alcohol use.  Tobacco use.  Drug use.  Emotional well-being.  Home and relationship well-being.  Sexual activity.  Eating habits.  History of falls.  Memory and ability to understand (cognition).  Work and work Statistician. Screening  You may have the following tests or measurements:  Height, weight, and BMI.  Blood pressure.  Lipid and cholesterol levels. These may be checked every 5 years, or more frequently if you are over 70 years old.  Skin check.  Lung cancer screening. You may have this screening every year starting at age 39 if you have a 30-pack-year history of smoking and currently smoke or have quit within the past 15 years.  Fecal occult blood test (FOBT) of the stool. You may have this test every year starting at age 80.  Flexible sigmoidoscopy or colonoscopy. You may have a sigmoidoscopy every 5 years or a colonoscopy every 10 years starting at age 31.  Prostate cancer screening. Recommendations will vary depending on your family history and other risks.  Hepatitis C blood test.  Hepatitis B blood test.  Sexually transmitted disease (STD) testing.  Diabetes screening. This is done by checking your blood sugar (glucose) after you have not eaten for a while (fasting). You may have this done every 1-3 years.  Abdominal aortic aneurysm (AAA) screening. You may need this if you are a current or former smoker.  Osteoporosis. You may be screened starting at age 23 if you are at high risk. Talk with your health care provider about your test results, treatment options, and if necessary, the need for more tests. Vaccines  Your health care provider may recommend certain vaccines, such as:  Influenza vaccine. This is recommended every year.  Tetanus, diphtheria,  and acellular pertussis (Tdap, Td) vaccine. You may need a Td booster every 10 years.  Zoster vaccine. You may need this after age 22.  Pneumococcal 13-valent conjugate  (PCV13) vaccine. One dose is recommended after age 65.  Pneumococcal polysaccharide (PPSV23) vaccine. One dose is recommended after age 40. Talk to your health care provider about which screenings and vaccines you need and how often you need them. This information is not intended to replace advice given to you by your health care provider. Make sure you discuss any questions you have with your health care provider. Document Released: 05/07/2015 Document Revised: 12/29/2015 Document Reviewed: 02/09/2015 Elsevier Interactive Patient Education  2017 [employer] Prevention in the Home Falls can cause injuries. They can happen to people of all ages. There are many things you can do to make your home safe and to help prevent falls. What can I do on the outside of my home?  Regularly fix the edges of walkways and driveways and fix any cracks.  Remove anything that might make you trip as you walk through a door, such as a raised step or threshold.  Trim any bushes or trees on the path to your home.  Use bright outdoor lighting.  Clear any walking paths of anything that might make someone trip, such as rocks or tools.  Regularly check to see if handrails are loose or broken. Make sure that both sides of any steps have handrails.  Any raised decks and porches should have guardrails on the edges.  Have any leaves, snow, or ice cleared regularly.  Use sand or salt on walking paths during winter.  Clean up any spills in your garage right away. This includes oil or grease spills. What can I do in the bathroom?  Use night lights.  Install grab bars by the toilet and in the tub and shower. Do not use towel bars as grab bars.  Use non-skid mats or decals in the tub or shower.  If you need to sit down in the shower, use a plastic, non-slip stool.  Keep the floor dry. Clean up any water that spills on the floor as soon as it happens.  Remove soap buildup in the tub or shower  regularly.  Attach bath mats securely with double-sided non-slip rug tape.  Do not have throw rugs and other things on the floor that can make you trip. What can I do in the bedroom?  Use night lights.  Make sure that you have a light by your bed that is easy to reach.  Do not use any sheets or blankets that are too big for your bed. They should not hang down onto the floor.  Have a firm chair that has side arms. You can use this for support while you get dressed.  Do not have throw rugs and other things on the floor that can make you trip. What can I do in the kitchen?  Clean up any spills right away.  Avoid walking on wet floors.  Keep items that you use a lot in easy-to-reach places.  If you need to reach something above you, use a strong step stool that has a grab bar.  Keep electrical cords out of the way.  Do not use floor polish or wax that makes floors slippery. If you must use wax, use non-skid floor wax.  Do not have throw rugs and other things on the floor that can make you trip. What can I do with my  stairs?  Do not leave any items on the stairs.  Make sure that there are handrails on both sides of the stairs and use them. Fix handrails that are broken or loose. Make sure that handrails are as long as the stairways.  Check any carpeting to make sure that it is firmly attached to the stairs. Fix any carpet that is loose or worn.  Avoid having throw rugs at the top or bottom of the stairs. If you do have throw rugs, attach them to the floor with carpet tape.  Make sure that you have a light switch at the top of the stairs and the bottom of the stairs. If you do not have them, ask someone to add them for you. What else can I do to help prevent falls?  Wear shoes that:  Do not have high heels.  Have rubber bottoms.  Are comfortable and fit you well.  Are closed at the toe. Do not wear sandals.  If you use a stepladder:  Make sure that it is fully  opened. Do not climb a closed stepladder.  Make sure that both sides of the stepladder are locked into place.  Ask someone to hold it for you, if possible.  Clearly mark and make sure that you can see:  Any grab bars or handrails.  First and last steps.  Where the edge of each step is.  Use tools that help you move around (mobility aids) if they are needed. These include:  Canes.  Walkers.  Scooters.  Crutches.  Turn on the lights when you go into a dark area. Replace any light bulbs as soon as they burn out.  Set up your furniture so you have a clear path. Avoid moving your furniture around.  If any of your floors are uneven, fix them.  If there are any pets around you, be aware of where they are.  Review your medicines with your doctor. Some medicines can make you feel dizzy. This can increase your chance of falling. Ask your doctor what other things that you can do to help prevent falls. This information is not intended to replace advice given to you by your health care provider. Make sure you discuss any questions you have with your health care provider. Document Released: 02/04/2009 Document Revised: 09/16/2015 Document Reviewed: 05/15/2014 Elsevier Interactive Patient Education  2017 [employer].

## 2020-10-07 ENCOUNTER — Telehealth: Payer: Self-pay | Admitting: General Practice

## 2020-10-07 ENCOUNTER — Ambulatory Visit (INDEPENDENT_AMBULATORY_CARE_PROVIDER_SITE_OTHER): Payer: Medicare HMO | Admitting: General Practice

## 2020-10-07 DIAGNOSIS — G250 Essential tremor: Secondary | ICD-10-CM

## 2020-10-07 DIAGNOSIS — N401 Enlarged prostate with lower urinary tract symptoms: Secondary | ICD-10-CM | POA: Diagnosis not present

## 2020-10-07 DIAGNOSIS — E039 Hypothyroidism, unspecified: Secondary | ICD-10-CM | POA: Diagnosis not present

## 2020-10-07 DIAGNOSIS — F419 Anxiety disorder, unspecified: Secondary | ICD-10-CM

## 2020-10-07 DIAGNOSIS — G20 Parkinson's disease: Secondary | ICD-10-CM | POA: Diagnosis not present

## 2020-10-07 DIAGNOSIS — M5030 Other cervical disc degeneration, unspecified cervical region: Secondary | ICD-10-CM

## 2020-10-07 NOTE — Chronic Care Management (AMB) (Signed)
Chronic Care Management   CCM RN Visit Note  10/07/2020 Name: Todd Green MRN: [id number] DOB: 23-Jan-1951  Subjective: Todd Green is a 70 y.o. year old male who is a primary care patient of Lorine Bears, Lupita Raider, FNP. The care management team was consulted for assistance with disease management and care coordination needs.    Engaged with patient by telephone for follow up visit in response to provider referral for case management and/or care coordination services.   Consent to Services:  The patient was given information about Chronic Care Management services, agreed to services, and gave verbal consent prior to initiation of services.  Please see initial visit note for detailed documentation.   Patient agreed to services and verbal consent obtained.   Assessment: Review of patient past medical history, allergies, medications, health status, including review of consultants reports, laboratory and other test data, was performed as part of comprehensive evaluation and provision of chronic care management services.   SDOH (Social Determinants of Health) assessments and interventions performed:    CCM Care Plan  Allergies  Allergen Reactions   Allegra [Fexofenadine] Other (See Comments)    Redness in gums   Sulfa Antibiotics Rash    Outpatient Encounter Medications as of 10/07/2020  Medication Sig   busPIRone (BUSPAR) 5 MG tablet Take by mouth. (Patient not taking: No sig reported)   carbidopa-levodopa (SINEMET IR) 25-100 MG tablet Take 1 tablet by mouth 3 (three) times daily.   cetirizine (ZYRTEC) 10 MG tablet Take 1 tablet (10 mg total) by mouth daily. (Patient not taking: No sig reported)   diclofenac Sodium (VOLTAREN) 1 % GEL Apply 2 g topically 4 (four) times daily. (Patient not taking: Reported on 09/28/2020)   fluticasone (FLONASE) 50 MCG/ACT nasal spray 2 sprays by Each Nare route daily.   Hyoscyamine Sulfate SL (LEVSIN/SL) 0.125 MG SUBL Place 0.125 mg under the tongue every 4  (four) hours as needed (bladder spasms, urgency, frequency). 1-2 TABS (Patient not taking: Reported on 09/28/2020)   levothyroxine (SYNTHROID) 137 MCG tablet TAKE 1 TABLET (137 MCG TOTAL) BY MOUTH DAILY BEFORE BREAKFAST (NEED MD APPOINTMENT)   Meth-Hyo-M Bl-Na Phos-Ph Sal (URIBEL) 118 MG CAPS Take 1 capsule (118 mg total) by mouth every 6 (six) hours as needed (dysuria). (Patient not taking: Reported on 09/28/2020)   Multiple Vitamin (MULTI-VITAMIN) tablet Take 1 tablet by mouth daily.   omeprazole (PRILOSEC) 40 MG capsule Take 1 capsule (40 mg total) by mouth daily.   tamsulosin (FLOMAX) 0.4 MG CAPS capsule Take 1 capsule (0.4 mg total) by mouth daily. (Patient not taking: No sig reported)   Testosterone 20.25 MG/1.25GM (1.62%) GEL Apply 1 application topically daily.   triamcinolone ointment (KENALOG) 0.5 % Apply 1 application topically 2 (two) times daily. (Patient taking differently: Apply 1 application topically daily as needed (rash).)   No facility-administered encounter medications on file as of 10/07/2020.    Patient Active Problem List   Diagnosis Date Noted   Parkinson disease ([place]) 06/08/2020   Dermatitis 04/13/2020   Nasal congestion 03/10/2020   Seasonal allergies 03/08/2020   Constipation 06/24/2019   Left hand pain 10/31/2017   Benign essential tremor 10/31/2017   Kyphosis of cervical region 02/03/2017   Acquired hypothyroidism 02/02/2017   Acid reflux 02/02/2017    Conditions to be addressed/monitored:Anxiety and PD, neck pain/limited mobility, Hypothyroidism   Care Plan : RNCM: Parkinson's Disease Management  Updates made by Vanita Ingles since 10/07/2020 12:00 AM     Problem: RNCM: Parkinson's Disease  Priority: Medium     Long-Range Goal: RNCM: Parklinson's Disease Management   Priority: Medium  Note:   Current Barriers:  Knowledge Deficits related to resources and support for new diagnosis of PD Care Coordination needs related to PD support group in [facility]  in a patient with new diagnosis of PD Chronic Disease Management support and education needs related to PD Unable to independently manage PD Lacks social connections Does not contact provider office for questions/concerns  Nurse Case Manager Clinical Goal(s):  patient will verbalize understanding of plan for effective management of PD patient will work with RNCM and pcp to address needs related to PD patient will work with care guides  (community agency) to find PD support and education in the community  Interventions:  1:1 collaboration with Malfi, Lupita Raider, FNP regarding development and update of comprehensive plan of care as evidenced by provider attestation and co-signature Inter-disciplinary care team collaboration (see longitudinal plan of care) Evaluation of current treatment plan related to Parkinson's Disease  and patient's adherence to plan as established by provider. 10-07-2020: The patient is happier since getting a second opinion on his PD. He has tremor predominant PD and his new neurologist spent and hour and a half working with him and explaining things. He feels much better and this is the mildest form of PD a person can have. The patient states that he is going to the gym every day and he is managing his PD well at this time. States he will not return to Dr. Brigitte Pulse.  Advised patient to of the sx and sx of Parkinson's disease and advancement of the disease process. 10-07-2020: Review of the patients sx and sx. The patient states he is doing well and denies any acute distress.  Was eating his lunch and happy today. States he was not going to the gym today because it is too hot outside.  Provided education to patient re: diagnosis of PD and how to effectively manage with other chronic conditions. 10-07-2020: Since getting established with a new neurologist he is much happier and not as anxious about his PD. States he knows how to effectively manage his care.  Reviewed medications  with patient and discussed compliance. 10-07-2020: Confirms compliance with his medications.  Provided patient with PD  educational materials related to overview and support of patients with PD Care Guide referral for resources in the area for PD support and ongoing learning  Discussed plans with patient for ongoing care management follow up and provided patient with direct contact information for care management team  Patient Goals/Self-Care Activities Over the next 120 days, patient will:  - Patient will self administer medications as prescribed Patient will attend all scheduled provider appointments Patient will call pharmacy for medication refills Patient will attend church or other social activities Patient will continue to perform ADL's independently Patient will continue to perform IADL's independently Patient will call provider office for new concerns or questions Patient will work with BSW to address care coordination needs and will continue to work with the clinical team to address health care and disease management related needs.    - barriers to meeting goals identified - change-talk evoked - choices provided - collaboration with team encouraged - decision-making supported - health risks reviewed - problem-solving facilitated - questions answered - readiness for change evaluated - reassurance provided - resources needed to meet goals identified - self-reflection promoted - self-reliance encouraged - verbalization of feelings encouraged Follow Up Plan: Telephone follow up appointment with  care management team member scheduled for: 12-02-2020 at 1 pm         Care Plan : RNCM: Neck pain and limited mobility due to traumatic injury  Updates made by Vanita Ingles since 10/07/2020 12:00 AM     Problem: RNCM: Neck pain/back pain- limited mobility due to traumatic injury   Priority: High     Long-Range Goal: RNCM: Neck pain/limited mobility   Priority: High  Note:    Current Barriers:  Knowledge Deficits related to managing acute/chronic pain Non-adherence to scheduled provider appointments Non-adherence to prescribed medication regimen Difficulty obtaining medications Chronic Disease Management support and education needs related to chronic pain Unable to independently manage neck pain and mobility/back pain Lacks social connections Does not maintain contact with provider office Does not contact provider office for questions/concerns Nurse Case Manager Clinical Goal(s):  patient will verbalize understanding of plan for managing pain Patient will see specialist in May patient will demonstrate use of different relaxation  skills and/or diversional activities to assist with pain reduction (distraction, imagery, relaxation, massage, acupressure, TENS, heat, and cold application patient will report pain at a level less than 3 to 4 on a 10-10 rating scale patient will use pharmacological and nonpharmacological pain relief strategies patient will verbalize acceptable level of pain relief and ability to engage in desired activities patient will engage in desired activities without an increase in pain level Interventions:  Collaboration with Malfi, Lupita Raider, FNP regarding development and update of comprehensive plan of care as evidenced by provider attestation and co-signature Inter-disciplinary care team collaboration (see longitudinal plan of care) - careful application of heat or ice encouraged - deep breathing, relaxation and mindfulness use promoted - effectiveness of pharmacologic therapy monitored - medication-induced side effects managed - misuse of pain medication assessed - motivation and barriers to change assessed and addressed. 10-07-2020: The patient has had an MRI and saw Dr. Arbie Cookey.  The patient states that everything checked out good and he is feeling much better. Denies pain or discomfort today. States mobility is better. Will likely close  this care plan at next outreach if the patient is stable and denies further issues with pain or discomfort.  - mutually acceptable comfort goal set - pain assessed - pain treatment goals reviewed - participation in physical therapy encouraged - participation in support group encouraged - premedication prior to activity encouraged Evaluation of current treatment plan related to neck pain/back pain and mobility  and patient's adherence to plan as established by provider. 10-07-2020: Has seen the provider. Denies any new issues at this time. States he is stable. MRI revealed no new concerns.  Advised patient to call the office for changes in condition or worsening pain and discomfort  Provided education to patient re: working with specialist, pacing activity, alternate pain relief measures, how often cortisone injections can be done, safety and preventing further injury Reviewed medications with patient and discussed compliance Discussed plans with patient for ongoing care management follow up and provided patient with direct contact information for care management team Allow patient to maintain a diary of pain ratings, timing, precipitating events, medications, treatments, and what works best to relieve pain,  Refer to support groups and self-help groups Educate patient about the use of pharmacological interventions for pain management- antianxiety, antidepressants, NSAIDS, opioid analgesics,  Explain the importance of lifestyle modifications to effective pain management  Patient Goals/Self Care Activities:  - active listening utilized - depression screen reviewed - fear of pain acknowledged - positive reinforcement provided -  problem-solving facilitated - relaxation techniques promoted - self-reflection promoted Self-administers medications as prescribed Attends all scheduled provider appointments Calls pharmacy for medication refills Calls provider office for new concerns or  questions Follow Up Plan: Telephone follow up appointment with care management team member scheduled for: 12-02-2020 at 1 pm      Care Plan : RNCM: Anxiety (Adult)  Updates made by Vanita Ingles since 10/07/2020 12:00 AM     Problem: RNCM: Anxiety Identification (Anxiety)   Priority: Medium     Long-Range Goal: RNCM: Anxiety Symptoms Identified   Priority: Medium  Note:   Current Barriers:  Chronic Disease Management support and education needs related to anxiety  Lacks caregiver support.  Unable to independently anxiety  Lacks social connections Does not contact provider office for questions/concerns  Nurse Case Manager Clinical Goal(s):  patient will verbalize understanding of plan for effective management of anxiety  patient will work with RNCM/CCM team and pcp to address needs related to anxiety  patient will demonstrate a decrease in anxiety exacerbations as evidenced by management of chronic conditions   Interventions:  1:1 collaboration with Lorine Bears, Lupita Raider, FNP regarding development and update of comprehensive plan of care as evidenced by provider attestation and co-signature Inter-disciplinary care team collaboration (see longitudinal plan of care) Evaluation of current treatment plan related to anxiety  and patient's adherence to plan as established by provider. 10-07-2020: The patient states his anxiety level is better as he has a definitive answer to his PD.  He is thankful he sought a second opinion.  The patient is doing much better and going to the gym daily. He feels like he has control of his chronic conditions. Will continue to monitor for changes.  Advised patient to call the office for changes in mood/anxiety/ depressed state Provided education to patient re: anxiety management  Discussed plans with patient for ongoing care management follow up and provided patient with direct contact information for care management team  Patient Goals/Self-Care Activities Over  the next 120 days, patient will:  - Patient will self administer medications as prescribed Patient will attend all scheduled provider appointments Patient will call pharmacy for medication refills Patient will attend church or other social activities Patient will continue to perform ADL's independently Patient will continue to perform IADL's independently Patient will call provider office for new concerns or questions Patient will work with BSW to address care coordination needs and will continue to work with the clinical team to address health care and disease management related needs.   - anxiety screen reviewed - depression screen reviewed - somatic and anxiety symptoms correlated  Follow Up Plan: Telephone follow up appointment with care management team member scheduled for: 8-11--2022 at 1 pm       Care Plan : RNCM: Hypothyroidism  Updates made by Vanita Ingles since 10/07/2020 12:00 AM     Problem: RNCM: Hypothyroidism   Priority: Medium     Long-Range Goal: RNCM: Hypothyroidsim   Priority: Medium  Note:   Current Barriers:  Chronic Disease Management support and education needs related to hypothyroidism  Lacks social connections Does not contact provider office for questions/concerns  Nurse Case Manager Clinical Goal(s):  patient will verbalize understanding of plan for effective management of hypothryoidism patient will work with RNCM and pcp  to address needs related to hypothyroidism  Interventions:  1:1 collaboration with Malfi, Lupita Raider, FNP regarding development and update of comprehensive plan of care as evidenced by provider attestation and co-signature Inter-disciplinary care  team collaboration (see longitudinal plan of care) Evaluation of current treatment plan related to hypothyroidism and patient's adherence to plan as established by provider. 10-07-2020: Review of compliance with medications and plan of care. The patient endorses compliance.  Advised  patient to call for changes or questions Provided education to patient re: hypothyroidism and effective management  through emmi and my chart. 10-07-2020: The patient denies any new educational needs at this time.  Discussed plans with patient for ongoing care management follow up and provided patient with direct contact information for care management team  Patient Goals/Self-Care Activities Over the next 120 days, patient will:  - Patient will self administer medications as prescribed Patient will attend all scheduled provider appointments Patient will call pharmacy for medication refills Patient will attend church or other social activities Patient will continue to perform ADL's independently Patient will continue to perform IADL's independently Patient will call provider office for new concerns or questions Patient will work with BSW to address care coordination needs and will continue to work with the clinical team to address health care and disease management related needs.   - barriers to meeting goals identified - change-talk evoked - collaboration with team encouraged - decision-making supported - health risks reviewed - problem-solving facilitated - questions answered - readiness for change evaluated - reassurance provided - resources needed to meet goals identified - self-reflection promoted - self-reliance encouraged - verbalization of feelings encouraged  Follow Up Plan: Telephone follow up appointment with care management team member scheduled for: 12-02-2020 at 1 pm         Plan:Telephone follow up appointment with care management team member scheduled for:  12-02-2020 at 1 pm  [place], MSN, [place] Mobile: [phone]

## 2020-10-07 NOTE — Patient Instructions (Signed)
Visit Information  PATIENT GOALS:    Patient Care Plan: RNCM: Parkinson's Disease Management     Problem Identified: RNCM: Parkinson's Disease   Priority: Medium      Task: RNCM: PD management   Note:   Care Management Activities:    - barriers to meeting goals identified - change-talk evoked - choices provided - collaboration with team encouraged - decision-making supported - health risks reviewed - problem-solving facilitated - questions answered - readiness for change evaluated - reassurance provided - resources needed to meet goals identified - self-reflection promoted - self-reliance encouraged - verbalization of feelings encouraged        Patient Care Plan: RNCM: Neck pain and limited mobility due to traumatic injury     Problem Identified: RNCM: Neck pain/back pain- limited mobility due to traumatic injury   Priority: High     Long-Range Goal: RNCM: Neck pain/limited mobility   Priority: High  Note:   Current Barriers:  Knowledge Deficits related to managing acute/chronic pain Non-adherence to scheduled provider appointments Non-adherence to prescribed medication regimen Difficulty obtaining medications Chronic Disease Management support and education needs related to chronic pain Unable to independently manage neck pain and mobility/back pain Lacks social connections Does not maintain contact with provider office Does not contact provider office for questions/concerns Nurse Case Manager Clinical Goal(s):  patient will verbalize understanding of plan for managing pain Patient will see specialist in May patient will demonstrate use of different relaxation  skills and/or diversional activities to assist with pain reduction (distraction, imagery, relaxation, massage, acupressure, TENS, heat, and cold application patient will report pain at a level less than 3 to 4 on a 10-10 rating scale patient will use pharmacological and nonpharmacological pain  relief strategies patient will verbalize acceptable level of pain relief and ability to engage in desired activities patient will engage in desired activities without an increase in pain level Interventions:  Collaboration with Malfi, Lupita Raider, FNP regarding development and update of comprehensive plan of care as evidenced by provider attestation and co-signature Inter-disciplinary care team collaboration (see longitudinal plan of care) - careful application of heat or ice encouraged - deep breathing, relaxation and mindfulness use promoted - effectiveness of pharmacologic therapy monitored - medication-induced side effects managed - misuse of pain medication assessed - motivation and barriers to change assessed and addressed. 10-07-2020: The patient has had an MRI and saw Dr. Arbie Cookey.  The patient states that everything checked out good and he is feeling much better. Denies pain or discomfort today. States mobility is better. Will likely close this care plan at next outreach if the patient is stable and denies further issues with pain or discomfort.  - mutually acceptable comfort goal set - pain assessed - pain treatment goals reviewed - participation in physical therapy encouraged - participation in support group encouraged - premedication prior to activity encouraged Evaluation of current treatment plan related to neck pain/back pain and mobility  and patient's adherence to plan as established by provider. 10-07-2020: Has seen the provider. Denies any new issues at this time. States he is stable. MRI revealed no new concerns.  Advised patient to call the office for changes in condition or worsening pain and discomfort  Provided education to patient re: working with specialist, pacing activity, alternate pain relief measures, how often cortisone injections can be done, safety and preventing further injury Reviewed medications with patient and discussed compliance Discussed plans with patient for  ongoing care management follow up and provided patient with direct contact information  for care management team Allow patient to maintain a diary of pain ratings, timing, precipitating events, medications, treatments, and what works best to relieve pain,  Refer to support groups and self-help groups Educate patient about the use of pharmacological interventions for pain management- antianxiety, antidepressants, NSAIDS, opioid analgesics,  Explain the importance of lifestyle modifications to effective pain management  Patient Goals/Self Care Activities:  - active listening utilized - depression screen reviewed - fear of pain acknowledged - positive reinforcement provided - problem-solving facilitated - relaxation techniques promoted - self-reflection promoted Self-administers medications as prescribed Attends all scheduled provider appointments Calls pharmacy for medication refills Calls provider office for new concerns or questions Follow Up Plan: Telephone follow up appointment with care management team member scheduled for: 12-02-2020 at 1 pm      Task: RNCM: Neck pain and limited mobility/Back pain   Note:   Care Management Activities:    - active listening utilized - depression screen reviewed - fear of pain acknowledged - positive reinforcement provided - problem-solving facilitated - relaxation techniques promoted - self-reflection promoted        Patient Care Plan: RNCM: Anxiety (Adult)     Problem Identified: RNCM: Anxiety Identification (Anxiety)   Priority: Medium     Long-Range Goal: RNCM: Anxiety Symptoms Identified   Priority: Medium  Note:   Current Barriers:  Chronic Disease Management support and education needs related to anxiety  Lacks caregiver support.  Unable to independently anxiety  Lacks social connections Does not contact provider office for questions/concerns  Nurse Case Manager Clinical Goal(s):  patient will verbalize understanding of  plan for effective management of anxiety  patient will work with RNCM/CCM team and pcp to address needs related to anxiety  patient will demonstrate a decrease in anxiety exacerbations as evidenced by management of chronic conditions   Interventions:  1:1 collaboration with Lorine Bears, Lupita Raider, FNP regarding development and update of comprehensive plan of care as evidenced by provider attestation and co-signature Inter-disciplinary care team collaboration (see longitudinal plan of care) Evaluation of current treatment plan related to anxiety  and patient's adherence to plan as established by provider. 10-07-2020: The patient states his anxiety level is better as he has a definitive answer to his PD.  He is thankful he sought a second opinion.  The patient is doing much better and going to the gym daily. He feels like he has control of his chronic conditions. Will continue to monitor for changes.  Advised patient to call the office for changes in mood/anxiety/ depressed state Provided education to patient re: anxiety management  Discussed plans with patient for ongoing care management follow up and provided patient with direct contact information for care management team  Patient Goals/Self-Care Activities Over the next 120 days, patient will:  - Patient will self administer medications as prescribed Patient will attend all scheduled provider appointments Patient will call pharmacy for medication refills Patient will attend church or other social activities Patient will continue to perform ADL's independently Patient will continue to perform IADL's independently Patient will call provider office for new concerns or questions Patient will work with BSW to address care coordination needs and will continue to work with the clinical team to address health care and disease management related needs.   - anxiety screen reviewed - depression screen reviewed - somatic and anxiety symptoms  correlated  Follow Up Plan: Telephone follow up appointment with care management team member scheduled for: 8-11--2022 at 1 pm  Task: RNCM: Identify Anxiety Symptoms and Facilitate Treatment   Note:   Care Management Activities:    - anxiety screen reviewed - depression screen reviewed - somatic and anxiety symptoms correlated        Patient Care Plan: RNCM: Hypothyroidism     Problem Identified: RNCM: Hypothyroidism   Priority: Medium     Long-Range Goal: RNCM: Hypothyroidsim   Priority: Medium  Note:   Current Barriers:  Chronic Disease Management support and education needs related to hypothyroidism  Lacks social connections Does not contact provider office for questions/concerns  Nurse Case Manager Clinical Goal(s):  patient will verbalize understanding of plan for effective management of hypothryoidism patient will work with RNCM and pcp  to address needs related to hypothyroidism  Interventions:  1:1 collaboration with Malfi, Lupita Raider, FNP regarding development and update of comprehensive plan of care as evidenced by provider attestation and co-signature Inter-disciplinary care team collaboration (see longitudinal plan of care) Evaluation of current treatment plan related to hypothyroidism and patient's adherence to plan as established by provider. 10-07-2020: Review of compliance with medications and plan of care. The patient endorses compliance.  Advised patient to call for changes or questions Provided education to patient re: hypothyroidism and effective management  through emmi and my chart. 10-07-2020: The patient denies any new educational needs at this time.  Discussed plans with patient for ongoing care management follow up and provided patient with direct contact information for care management team  Patient Goals/Self-Care Activities Over the next 120 days, patient will:  - Patient will self administer medications as prescribed Patient will attend  all scheduled provider appointments Patient will call pharmacy for medication refills Patient will attend church or other social activities Patient will continue to perform ADL's independently Patient will continue to perform IADL's independently Patient will call provider office for new concerns or questions Patient will work with BSW to address care coordination needs and will continue to work with the clinical team to address health care and disease management related needs.   - barriers to meeting goals identified - change-talk evoked - collaboration with team encouraged - decision-making supported - health risks reviewed - problem-solving facilitated - questions answered - readiness for change evaluated - reassurance provided - resources needed to meet goals identified - self-reflection promoted - self-reliance encouraged - verbalization of feelings encouraged  Follow Up Plan: Telephone follow up appointment with care management team member scheduled for: 12-02-2020 at 1 pm        Task: RNCM: Hypothyroidism   Note:   Care Management Activities:    - barriers to meeting goals identified - change-talk evoked - collaboration with team encouraged - decision-making supported - health risks reviewed - problem-solving facilitated - questions answered - readiness for change evaluated - reassurance provided - resources needed to meet goals identified - self-reflection promoted - self-reliance encouraged - verbalization of feelings encouraged           Patient verbalizes understanding of instructions provided today and agrees to view in [place].   Telephone follow up appointment with care management team member scheduled for: 12-02-2020 at 1 pm  Noreene Larsson RN, MSN, [place] Mobile: [phone]

## 2020-11-03 ENCOUNTER — Ambulatory Visit (INDEPENDENT_AMBULATORY_CARE_PROVIDER_SITE_OTHER): Payer: Medicare HMO | Admitting: Licensed Clinical Social Worker

## 2020-11-03 DIAGNOSIS — F331 Major depressive disorder, recurrent, moderate: Secondary | ICD-10-CM | POA: Diagnosis not present

## 2020-11-03 DIAGNOSIS — F419 Anxiety disorder, unspecified: Secondary | ICD-10-CM

## 2020-11-03 DIAGNOSIS — G20 Parkinson's disease: Secondary | ICD-10-CM

## 2020-11-03 DIAGNOSIS — G250 Essential tremor: Secondary | ICD-10-CM

## 2020-11-05 NOTE — Patient Instructions (Signed)
Visit Information   Goals Addressed             This Visit's Progress    SW-Manage My Emotions   On track    Timeframe:  Long-Range Goal Priority:  Medium Start Date:  01/21/21                           Expected End Date:  01/05/21                     Patient Goals/Self-Care Activities: Over the next 60 days Attend upcoming appointments Continue to exercise at least 2 to 3 times per week Practice positive thinking and self-talk        Patient verbalizes understanding of instructions provided today and agrees to view in [place].   Telephone follow up appointment with care management team member scheduled for:01/05/21  Christa See, MSW, [email or web address] Phone [phone] 10:04 AM

## 2020-11-05 NOTE — Chronic Care Management (AMB) (Signed)
Chronic Care Management    Clinical Social Work Note  11/05/2020 Name: Todd Green MRN: [id number] DOB: 1951-02-26  Todd Green is a 70 y.o. year old male who is a primary care patient of Todd Green, Todd Raider, FNP. The CCM team was consulted to assist the patient with chronic disease management and/or care coordination needs related to: Mental Health Counseling and Resources.   Engaged with patient by telephone for follow up visit in response to provider referral for social work chronic care management and care coordination services.   Consent to Services:  The patient was given information about Chronic Care Management services, agreed to services, and gave verbal consent prior to initiation of services.  Please see initial visit note for detailed documentation.   Patient agreed to services and consent obtained.   Assessment: Patient is engaged in conversation, continues to maintain positive progress with care plan goals. He continues to report decrease in anxiety symptoms and participating in healthy coping skills. CCM LCSW will complete a new referral for assistance with safety rails to promote safety. See Care Plan below for interventions and patient self-care actives. Recommendation: Patient may benefit from, and is in agreement to work with LCSW to address care coordination needs and will continue to work with the clinical team to address health care and disease management related needs.  Follow up Plan: Patient would like continued follow-up from CCM LCSW .  Follow up scheduled in 01/05/21. Patient will call office if needed prior to next encounter.  SDOH (Social Determinants of Health) assessments and interventions performed:    Advanced Directives Status: Not addressed in this encounter.  CCM Care Plan  Allergies  Allergen Reactions   Allegra [Fexofenadine] Other (See Comments)    Redness in gums   Sulfa Antibiotics Rash    Outpatient Encounter Medications as of 11/03/2020   Medication Sig   busPIRone (BUSPAR) 5 MG tablet Take by mouth. (Patient not taking: No sig reported)   carbidopa-levodopa (SINEMET IR) 25-100 MG tablet Take 1 tablet by mouth 3 (three) times daily.   cetirizine (ZYRTEC) 10 MG tablet Take 1 tablet (10 mg total) by mouth daily. (Patient not taking: No sig reported)   diclofenac Sodium (VOLTAREN) 1 % GEL Apply 2 g topically 4 (four) times daily. (Patient not taking: Reported on 09/28/2020)   fluticasone (FLONASE) 50 MCG/ACT nasal spray 2 sprays by Each Nare route daily.   Hyoscyamine Sulfate SL (LEVSIN/SL) 0.125 MG SUBL Place 0.125 mg under the tongue every 4 (four) hours as needed (bladder spasms, urgency, frequency). 1-2 TABS (Patient not taking: Reported on 09/28/2020)   levothyroxine (SYNTHROID) 137 MCG tablet TAKE 1 TABLET (137 MCG TOTAL) BY MOUTH DAILY BEFORE BREAKFAST (NEED MD APPOINTMENT)   Meth-Hyo-M Bl-Na Phos-Ph Sal (URIBEL) 118 MG CAPS Take 1 capsule (118 mg total) by mouth every 6 (six) hours as needed (dysuria). (Patient not taking: Reported on 09/28/2020)   Multiple Vitamin (MULTI-VITAMIN) tablet Take 1 tablet by mouth daily.   omeprazole (PRILOSEC) 40 MG capsule Take 1 capsule (40 mg total) by mouth daily.   tamsulosin (FLOMAX) 0.4 MG CAPS capsule Take 1 capsule (0.4 mg total) by mouth daily. (Patient not taking: No sig reported)   Testosterone 20.25 MG/1.25GM (1.62%) GEL Apply 1 application topically daily.   triamcinolone ointment (KENALOG) 0.5 % Apply 1 application topically 2 (two) times daily. (Patient taking differently: Apply 1 application topically daily as needed (rash).)   No facility-administered encounter medications on file as of 11/03/2020.    Patient Active  Problem List   Diagnosis Date Noted   Parkinson disease ([place]) 06/08/2020   Dermatitis 04/13/2020   Nasal congestion 03/10/2020   Seasonal allergies 03/08/2020   Constipation 06/24/2019   Left hand pain 10/31/2017   Benign essential tremor 10/31/2017   Kyphosis of  cervical region 02/03/2017   Acquired hypothyroidism 02/02/2017   Acid reflux 02/02/2017    Conditions to be addressed/monitored: Anxiety; Mental Health Concerns   Care Plan : General Social Work (Adult)  Updates made by Christa See D, LCSW since 11/05/2020 12:00 AM     Problem: Anxiety      Long-Range Goal: Management of Anxiety Symptoms   Start Date: 06/22/2020  This Visit's Progress: On track  Recent Progress: On track  Priority: Medium  Note:   Current barriers:   Chronic Mental Health needs related to Anxiety Needs Support, Education, and Care Coordination in order to meet unmet mental health needs Clinical Goal(s): Over the next 60 days, patient will work with SW to reduce or manage symptoms of anxiety and stress and increase knowledge and/or ability of: coping skills and stress reduction Clinical Interventions:  Assessed patient's previous treatment, needs, coping skills, current treatment, support system and barriers to care   brief mental health assessment;Suicidal Ideation/Homicidal Ideation: No  Patient continues to endorse a decrease in anxiety symptoms Patient reports that he has not been contacted to address safety rails for outside home to promote safety. Per chart review, patient was contacted and provided resources; however, patient denies this occurring. CCM LCSW will complete new referral to Care Guide  Pt continues to visit the gym regularly to manage stress and promote health/well-being. He has a strong support system that consists of adult daughter and girlfriend Other interventions: Active listening / Reflection utilized , Emotional Supportive Provided, and Problem [place]  ; Collaboration with PCP regarding development and update of comprehensive plan of care as evidenced by provider attestation and co-signature Inter-disciplinary care team collaboration (see longitudinal plan of care) Patient Goals/Self-Care Activities: Over the next 60  days Attend upcoming appointments Continue to exercise at least 2 to 3 times per week Practice positive thinking and self-talk        Christa See, MSW, [email or web address] Phone [phone] 10:02 AM

## 2020-11-08 ENCOUNTER — Telehealth: Payer: Self-pay | Admitting: *Deleted

## 2020-11-08 DIAGNOSIS — G20 Parkinson's disease: Secondary | ICD-10-CM | POA: Diagnosis not present

## 2020-11-08 NOTE — Telephone Encounter (Signed)
   Telephone encounter was:  Unsuccessful.  11/08/2020 Name: Todd Green MRN: [id number] DOB: 07/29/1950  Unsuccessful outbound call made today to assist with:  Home Modifications  Outreach Attempt:  1st Attempt  A HIPAA compliant voice message was left requesting a return call.  Instructed patient to call back at    Instructed patient to call back at [phone]  at their earliest convenience.   [place], Care Management  [phone] [address] , [place] [address] Email : [name]

## 2020-11-09 ENCOUNTER — Ambulatory Visit (INDEPENDENT_AMBULATORY_CARE_PROVIDER_SITE_OTHER): Payer: Medicare HMO | Admitting: Internal Medicine

## 2020-11-09 ENCOUNTER — Other Ambulatory Visit: Payer: Self-pay

## 2020-11-09 ENCOUNTER — Encounter: Payer: Self-pay | Admitting: Internal Medicine

## 2020-11-09 VITALS — BP 111/58 | HR 83 | Temp 97.5°F | Resp 17 | Ht 69.5 in | Wt 174.8 lb

## 2020-11-09 DIAGNOSIS — R55 Syncope and collapse: Secondary | ICD-10-CM | POA: Diagnosis not present

## 2020-11-09 DIAGNOSIS — E663 Overweight: Secondary | ICD-10-CM | POA: Insufficient documentation

## 2020-11-09 DIAGNOSIS — S40022A Contusion of left upper arm, initial encounter: Secondary | ICD-10-CM

## 2020-11-09 DIAGNOSIS — Z6825 Body mass index (BMI) 25.0-25.9, adult: Secondary | ICD-10-CM | POA: Diagnosis not present

## 2020-11-09 DIAGNOSIS — G20 Parkinson's disease: Secondary | ICD-10-CM

## 2020-11-09 MED ORDER — ACETAMINOPHEN-CODEINE #3 300-30 MG PO TABS: 1.0000 | ORAL_TABLET | Freq: Three times a day (TID) | ORAL | 0 refills | Status: DC | PRN

## 2020-11-09 NOTE — Patient Instructions (Signed)
Hematoma A hematoma is a collection of blood. A hematoma can happen: Under the skin. In an organ. In a body space. In a joint space. In other tissues. The blood can thicken (clot) to form a lump that you can see and feel. The lump is often hard and may become sore and tender. The lump can be very small or very big. Most hematomas get better in a few days to weeks. However, some hematomas may be serious andneed medical care. What are the causes? This condition is caused by: An injury. Blood that leaks under the skin. Problems from surgeries. Medical conditions that cause bleeding or bruising. What increases the risk? You are more likely to develop this condition if: You are an older adult. You use medicines that thin your blood. What are the signs or symptoms? Symptoms depend on where the hematoma is in your body. If the hematoma is under the skin, there is: A firm lump on the body. Pain and tenderness in the area. Bruising. The skin above the lump may be blue, dark blue, purple-red, or yellowish. If the hematoma is deep in the tissues or body spaces, there may be: Blood in the stomach. This may cause pain in the belly (abdomen), weakness, passing out (fainting), and shortness of breath. Blood in the head. This may cause a headache, weakness, trouble speaking or understanding speech, or passing out. How is this diagnosed? This condition is diagnosed based on: Your medical history. A physical exam. Imaging tests, such as ultrasound or CT scan. Blood tests. How is this treated? Treatment depends on the cause, size, and location of the hematoma. Treatment may include: Doing nothing. Many hematomas go away on their own without treatment. Surgery or close monitoring. This may be needed for large hematomas or hematomas that affect the body's organs. Medicines. These may be given if a medical condition caused the hematoma. Follow these instructions at home: Managing pain, stiffness,  and swelling  If told, put ice on the area. Put ice in a plastic bag. Place a towel between your skin and the bag. Leave the ice on for 20 minutes, 2-3 times a day for the first two days. If told, put heat on the affected area after putting ice on the area for two days. Use the heat source that your doctor tells you to use. This could be a moist heat pack or a heating pad. To do this: Place a towel between your skin and the heat source. Leave the heat on for 20-30 minutes. Remove the heat if your skin turns bright red. This is very important if you are unable to feel pain, heat, or cold. You may have a greater risk of getting burned. Raise (elevate) the affected area above the level of your heart while you are sitting or lying down. Wrap the affected area with an elastic bandage, if told by your doctor. Do not wrap the bandage too tightly. If your hematoma is on a leg or foot and is painful, your doctor may give you crutches. Use them as told by your doctor.  General instructions Take over-the-counter and prescription medicines only as told by your doctor. Keep all follow-up visits as told by your doctor. This is important. Contact a doctor if: You have a fever. The swelling or bruising gets worse. You start to get more hematomas. Get help right away if: Your pain gets worse. Your pain is not getting better with medicine. Your skin over the hematoma breaks or starts to bleed.   Your hematoma is in your chest or belly and you: Pass out. Feel weak. Become short of breath. You have a hematoma on your scalp that is caused by a fall or injury, and you: Have a headache that gets worse. Have trouble speaking or understanding speech. Become less alert or you pass out. Summary A hematoma is a collection of blood in any part of your body. Most hematomas get better on their own in a few days to weeks. Some may need medical care. Follow instructions from your doctor about how to care for your  hematoma. Contact a doctor if the swelling or bruising gets worse, or if you are short of breath. This information is not intended to replace advice given to you by your health care provider. Make sure you discuss any questions you have with your healthcare provider. Document Revised: 09/13/2017 Document Reviewed: 09/13/2017 Elsevier Patient Education  2022 [employer]  
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
## 2020-11-09 NOTE — Assessment & Plan Note (Signed)
Encouraged diet and exercise for weight loss ?

## 2020-11-17 ENCOUNTER — Telehealth: Payer: Self-pay | Admitting: *Deleted

## 2020-11-17 NOTE — Telephone Encounter (Signed)
   Telephone encounter was:  Unsuccessful.  11/17/2020 Name: Todd Green MRN: [id number] DOB: 06-28-1950  Unsuccessful outbound call made today to assist with:  Home Modifications  Outreach Attempt:  2nd Attempt  A HIPAA compliant voice message was left requesting a return call.  Instructed patient to call back at   Instructed patient to call back at [phone]  at their earliest convenience. .  [facility], Care Management  [phone] [address] , [place] [address] Email : [name]

## 2020-11-18 ENCOUNTER — Telehealth: Payer: Self-pay | Admitting: *Deleted

## 2020-11-18 NOTE — Telephone Encounter (Signed)
   Telephone encounter was:  Unsuccessful.  11/18/2020 Name: Todd Green MRN: [id number] DOB: [birth year]  Unsuccessful outbound call made today to assist with:  Food Insecurity  Outreach Attempt:  2nd Attempt  A HIPAA compliant voice message was left requesting a return call.  Instructed patient to call back at   Instructed patient to call back at [phone]  at their earliest convenience.   [facility], Care Management  [phone] [address] , [place] [address] Email : [name]

## 2020-11-19 ENCOUNTER — Telehealth: Payer: Self-pay | Admitting: *Deleted

## 2020-11-19 NOTE — Telephone Encounter (Signed)
   Telephone encounter was:  Successful.  11/19/2020 Name: Densel Mehrotra MRN: [id number] DOB: [birth year]  Drex Piersol is a 70 y.o. year old male who is a primary care patient of Jearld Fenton, NP . The community resource team was consulted for assistance with Home Modifications  Care guide performed the following interventions: Patient provided with information about care guide support team and interviewed to confirm resource needs.  Follow Up Plan:  No further follow up planned at this time. The patient has been provided with needed resources.  [facility], Care Management  [phone] [address] , [place] [address] Email : [name]

## 2020-11-25 DIAGNOSIS — M542 Cervicalgia: Secondary | ICD-10-CM | POA: Diagnosis not present

## 2020-12-02 ENCOUNTER — Telehealth: Payer: Self-pay

## 2020-12-02 ENCOUNTER — Telehealth: Payer: Self-pay | Admitting: General Practice

## 2020-12-02 DIAGNOSIS — M542 Cervicalgia: Secondary | ICD-10-CM | POA: Diagnosis not present

## 2020-12-02 NOTE — Telephone Encounter (Signed)
  Care Management   Follow Up Note   12/02/2020 Name: Todd Green MRN: [id number] DOB: October 05, 1950   Referred by: Jearld Fenton, NP Reason for referral : Chronic Care Management (RNCM: Follow up for Chronic Disease Management and Care Coordination Needs )   An unsuccessful telephone outreach was attempted today. The patient was referred to the case management team for assistance with care management and care coordination.   Follow Up Plan: A HIPPA compliant phone message was left for the patient providing contact information and requesting a return call.   Noreene Larsson RN, MSN, [place] Mobile: [phone]

## 2020-12-06 ENCOUNTER — Other Ambulatory Visit: Payer: Self-pay | Admitting: Family Medicine

## 2020-12-06 DIAGNOSIS — J301 Allergic rhinitis due to pollen: Secondary | ICD-10-CM

## 2020-12-09 DIAGNOSIS — M542 Cervicalgia: Secondary | ICD-10-CM | POA: Diagnosis not present

## 2020-12-10 ENCOUNTER — Other Ambulatory Visit: Payer: Self-pay | Admitting: Family Medicine

## 2020-12-10 DIAGNOSIS — N401 Enlarged prostate with lower urinary tract symptoms: Secondary | ICD-10-CM | POA: Diagnosis not present

## 2020-12-10 DIAGNOSIS — E291 Testicular hypofunction: Secondary | ICD-10-CM | POA: Diagnosis not present

## 2020-12-10 DIAGNOSIS — K219 Gastro-esophageal reflux disease without esophagitis: Secondary | ICD-10-CM

## 2020-12-10 DIAGNOSIS — Z79899 Other long term (current) drug therapy: Secondary | ICD-10-CM | POA: Diagnosis not present

## 2020-12-13 ENCOUNTER — Ambulatory Visit (INDEPENDENT_AMBULATORY_CARE_PROVIDER_SITE_OTHER): Payer: Medicare HMO | Admitting: General Practice

## 2020-12-13 ENCOUNTER — Telehealth: Payer: Medicare HMO | Admitting: General Practice

## 2020-12-13 DIAGNOSIS — G250 Essential tremor: Secondary | ICD-10-CM

## 2020-12-13 DIAGNOSIS — F419 Anxiety disorder, unspecified: Secondary | ICD-10-CM

## 2020-12-13 DIAGNOSIS — E039 Hypothyroidism, unspecified: Secondary | ICD-10-CM

## 2020-12-13 DIAGNOSIS — G20 Parkinson's disease: Secondary | ICD-10-CM

## 2020-12-13 DIAGNOSIS — S199XXA Unspecified injury of neck, initial encounter: Secondary | ICD-10-CM

## 2020-12-13 DIAGNOSIS — R251 Tremor, unspecified: Secondary | ICD-10-CM

## 2020-12-13 DIAGNOSIS — S134XXA Sprain of ligaments of cervical spine, initial encounter: Secondary | ICD-10-CM

## 2020-12-13 NOTE — Patient Instructions (Signed)
Visit Information  PATIENT GOALS:  Goals Addressed             This Visit's Progress    RNCM: Cope with Chronic Pain       Timeframe:  Long-Range Goal Priority:  High Start Date:     12-13-2020                        Expected End Date:     12-13-2021                  Follow Up Date 02/14/2021    - join a support group - learn how to meditate - learn relaxation techniques - practice acceptance of chronic pain - practice relaxation or meditation daily - spend time with positive people - tell myself I can (not I can't) - think of new ways to do favorite things - use distraction techniques - use relaxation during pain    Why is this important?   Stress makes chronic pain feel worse.  Feelings like depression, anxiety, stress and anger can make your body more sensitive to pain.  Learning ways to cope with stress or depression may help you find some relief from the pain.     Notes: 12-13-2020: The patient states that he is doing well with his pain at this time. States he is going daily to the gym and working out. Feels he is at a good place with the management of his health and well being. Will continue to monitor.         Patient verbalizes understanding of instructions provided today and agrees to view in [place].   Telephone follow up appointment with care management team member scheduled for:02-14-2021 at 145 pm  Noreene Larsson RN, MSN, [place] Mobile: [phone]

## 2020-12-13 NOTE — Chronic Care Management (AMB) (Signed)
Chronic Care Management   CCM RN Visit Note  12/13/2020 Name: Todd Green MRN: [id number] DOB: [birth year]  Subjective: Todd Green is a 70 y.o. year old male who is a primary care patient of Jearld Fenton, NP. The care management team was consulted for assistance with disease management and care coordination needs.    Engaged with patient by telephone for follow up visit in response to provider referral for case management and/or care coordination services.   Consent to Services:  The patient was given information about Chronic Care Management services, agreed to services, and gave verbal consent prior to initiation of services.  Please see initial visit note for detailed documentation.   Patient agreed to services and verbal consent obtained.   Assessment: Review of patient past medical history, allergies, medications, health status, including review of consultants reports, laboratory and other test data, was performed as part of comprehensive evaluation and provision of chronic care management services.   SDOH (Social Determinants of Health) assessments and interventions performed:    CCM Care Plan  Allergies  Allergen Reactions   Allegra [Fexofenadine] Other (See Comments)    Redness in gums   Sulfa Antibiotics Rash    Outpatient Encounter Medications as of 12/13/2020  Medication Sig   acetaminophen-codeine (TYLENOL #3) 300-30 MG tablet Take 1 tablet by mouth every 8 (eight) hours as needed for moderate pain.   carbidopa-levodopa (SINEMET IR) 25-100 MG tablet Take 1 tablet by mouth 3 (three) times daily.   cetirizine (ZYRTEC) 10 MG tablet Take 1 tablet (10 mg total) by mouth daily.   diclofenac Sodium (VOLTAREN) 1 % GEL Apply 2 g topically 4 (four) times daily.   fluticasone (FLONASE) 50 MCG/ACT nasal spray USE 2 SPRAYS IN EACH NOSTRIL EVERY DAY   levothyroxine (SYNTHROID) 137 MCG tablet TAKE 1 TABLET (137 MCG TOTAL) BY MOUTH DAILY BEFORE BREAKFAST (NEED MD APPOINTMENT)    Multiple Vitamin (MULTI-VITAMIN) tablet Take 1 tablet by mouth daily.   omeprazole (PRILOSEC) 40 MG capsule TAKE 1 CAPSULE EVERY DAY   Testosterone 20.25 MG/1.25GM (1.62%) GEL Apply 1 application topically daily.   triamcinolone ointment (KENALOG) 0.5 % Apply 1 application topically 2 (two) times daily. (Patient taking differently: Apply 1 application topically daily as needed (rash).)   No facility-administered encounter medications on file as of 12/13/2020.    Patient Active Problem List   Diagnosis Date Noted   Overweight with body mass index (BMI) of 25 to 25.9 in adult 11/09/2020   Parkinson disease ([place]) 06/08/2020   Benign essential tremor 10/31/2017   Kyphosis of cervical region 02/03/2017   Acquired hypothyroidism 02/02/2017   Acid reflux 02/02/2017    Conditions to be addressed/monitored:Anxiety and PD, neck pain, and hypothyroidism  Care Plan : RNCM: Parkinson's Disease Management  Updates made by Vanita Ingles since 12/13/2020 12:00 AM     Problem: RNCM: Parkinson's Disease   Priority: Medium     Long-Range Goal: RNCM: Parklinson's Disease Management   Start Date: 07/29/2020  Expected End Date: 08/18/2021  This Visit's Progress: On track  Priority: Medium  Note:   Current Barriers:  Knowledge Deficits related to resources and support for new diagnosis of PD Care Coordination needs related to PD support group in [name]  in a patient with new diagnosis of PD Chronic Disease Management support and education needs related to PD Unable to independently manage PD Lacks social connections Does not contact provider office for questions/concerns  Nurse Case Manager Clinical Goal(s):  patient will verbalize  understanding of plan for effective management of PD patient will work with [facility] and pcp to address needs related to PD patient will work with care guides  (community agency) to find PD support and education in the community  Interventions:  1:1  collaboration with Jearld Fenton, NP regarding development and update of comprehensive plan of care as evidenced by provider attestation and co-signature Inter-disciplinary care team collaboration (see longitudinal plan of care) Evaluation of current treatment plan related to Parkinson's Disease  and patient's adherence to plan as established by provider. 10-07-2020: The patient is happier since getting a second opinion on his PD. He has tremor predominant PD and his new neurologist spent and hour and a half working with him and explaining things. He feels much better and this is the mildest form of PD a person can have. The patient states that he is going to the gym every day and he is managing his PD well at this time. States he will not return to Dr. Brigitte Pulse. 12-13-2020: The patient states he is doing well and not having any issues with his PD at this time. Is taking the medications and it is effective with helping the tremor he is having. States he will return to see the neurologist in November.  Advised patient to of the sx and sx of Parkinson's disease and advancement of the disease process. 10-07-2020: Review of the patients sx and sx. The patient states he is doing well and denies any acute distress.  Was eating his lunch and happy today. States he was not going to the gym today because it is too hot outside. 12-13-2020: The patient states he has been consistently going to the gym and he is doing well. Denies any new sx and sx of his PD. Feels better since getting a second opinion. Will continue to monitor.  Provided education to patient re: diagnosis of PD and how to effectively manage with other chronic conditions. 12-13-2020: Since getting established with a new neurologist he is much happier and not as anxious about his PD. States he knows how to effectively manage his care.  Reviewed medications with patient and discussed compliance. 12-13-2020: Confirms compliance with his medications.  Provided patient  with PD  educational materials related to overview and support of patients with PD Care Guide referral for resources in the area for PD support and ongoing learning  Discussed plans with patient for ongoing care management follow up and provided patient with direct contact information for care management team  Patient Goals/Self-Care Activities  patient will:  - Patient will self administer medications as prescribed Patient will attend all scheduled provider appointments Patient will call pharmacy for medication refills Patient will attend church or other social activities Patient will continue to perform ADL's independently Patient will continue to perform IADL's independently Patient will call provider office for new concerns or questions Patient will work with BSW to address care coordination needs and will continue to work with the clinical team to address health care and disease management related needs.    - barriers to meeting goals identified - change-talk evoked - choices provided - collaboration with team encouraged - decision-making supported - health risks reviewed - problem-solving facilitated - questions answered - readiness for change evaluated - reassurance provided - resources needed to meet goals identified - self-reflection promoted - self-reliance encouraged - verbalization of feelings encouraged Follow Up Plan: Telephone follow up appointment with care management team member scheduled for: 02-14-2021 at 145 pm  Task: RNCM: PD management Completed 12/13/2020  Outcome: Positive  Note:   Care Management Activities:    - barriers to meeting goals identified - change-talk evoked - choices provided - collaboration with team encouraged - decision-making supported - health risks reviewed - problem-solving facilitated - questions answered - readiness for change evaluated - reassurance provided - resources needed to meet goals identified -  self-reflection promoted - self-reliance encouraged - verbalization of feelings encouraged        Care Plan : RNCM: Neck pain and limited mobility due to traumatic injury  Updates made by Vanita Ingles since 12/13/2020 12:00 AM     Problem: RNCM: Neck pain/back pain- limited mobility due to traumatic injury   Priority: High     Long-Range Goal: RNCM: Neck pain/limited mobility   Start Date: 07/29/2020  Expected End Date: 07/29/2021  This Visit's Progress: On track  Priority: High  Note:   Current Barriers:  Knowledge Deficits related to managing acute/chronic pain Non-adherence to scheduled provider appointments Non-adherence to prescribed medication regimen Difficulty obtaining medications Chronic Disease Management support and education needs related to chronic pain Unable to independently manage neck pain and mobility/back pain Lacks social connections Does not maintain contact with provider office Does not contact provider office for questions/concerns Nurse Case Manager Clinical Goal(s):  patient will verbalize understanding of plan for managing pain Patient will see specialist in May patient will demonstrate use of different relaxation  skills and/or diversional activities to assist with pain reduction (distraction, imagery, relaxation, massage, acupressure, TENS, heat, and cold application patient will report pain at a level less than 3 to 4 on a 10-10 rating scale patient will use pharmacological and nonpharmacological pain relief strategies patient will verbalize acceptable level of pain relief and ability to engage in desired activities patient will engage in desired activities without an increase in pain level Interventions:  Collaboration with Jearld Fenton, NP regarding development and update of comprehensive plan of care as evidenced by provider attestation and co-signature Inter-disciplinary care team collaboration (see longitudinal plan of care) - careful  application of heat or ice encouraged - deep breathing, relaxation and mindfulness use promoted - effectiveness of pharmacologic therapy monitored - medication-induced side effects managed - misuse of pain medication assessed - motivation and barriers to change assessed and addressed. 10-07-2020: The patient has had an MRI and saw Dr. Arbie Cookey.  The patient states that everything checked out good and he is feeling much better. Denies pain or discomfort today. States mobility is better. Will likely close this care plan at next outreach if the patient is stable and denies further issues with pain or discomfort. 12-13-2020: The patient feels he is doing well at managing his pain right now. Denies any acute distress at this time. Is going almost daily to work out at [employer]. Feels he is doing well at th is time. Will continue to monitor.  - mutually acceptable comfort goal set - pain assessed - pain treatment goals reviewed - participation in physical therapy encouraged - participation in support group encouraged - premedication prior to activity encouraged Evaluation of current treatment plan related to neck pain/back pain and mobility  and patient's adherence to plan as established by provider. 12-13-2020: Has seen the provider. Denies any new issues at this time. States he is stable. MRI revealed no new concerns.  Advised patient to call the office for changes in condition or worsening pain and discomfort  Provided education to patient re: working with specialist, pacing activity,  alternate pain relief measures, how often cortisone injections can be done, safety and preventing further injury Reviewed medications with patient and discussed compliance Discussed plans with patient for ongoing care management follow up and provided patient with direct contact information for care management team Allow patient to maintain a diary of pain ratings, timing, precipitating events, medications, treatments, and what  works best to relieve pain,  Refer to support groups and self-help groups Educate patient about the use of pharmacological interventions for pain management- antianxiety, antidepressants, NSAIDS, opioid analgesics,  Explain the importance of lifestyle modifications to effective pain management  Patient Goals/Self Care Activities:  - active listening utilized - depression screen reviewed - fear of pain acknowledged - positive reinforcement provided - problem-solving facilitated - relaxation techniques promoted - self-reflection promoted Self-administers medications as prescribed Attends all scheduled provider appointments Calls pharmacy for medication refills Calls provider office for new concerns or questions Follow Up Plan: Telephone follow up appointment with care management team member scheduled for: 02-14-2021 at 145pm      Task: RNCM: Neck pain and limited mobility/Back pain Completed 12/13/2020  Outcome: Positive  Note:   Care Management Activities:    - active listening utilized - depression screen reviewed - fear of pain acknowledged - positive reinforcement provided - problem-solving facilitated - relaxation techniques promoted - self-reflection promoted        Care Plan : RNCM: Anxiety (Adult)  Updates made by Vanita Ingles since 12/13/2020 12:00 AM     Problem: RNCM: Anxiety Identification (Anxiety)   Priority: Medium     Long-Range Goal: RNCM: Anxiety Symptoms Identified   Start Date: 07/29/2020  Expected End Date: 07/29/2021  This Visit's Progress: On track  Priority: Medium  Note:   Current Barriers:  Chronic Disease Management support and education needs related to anxiety  Lacks caregiver support.  Unable to independently anxiety  Lacks social connections Does not contact provider office for questions/concerns  Nurse Case Manager Clinical Goal(s):  patient will verbalize understanding of plan for effective management of anxiety  patient will work  with RNCM/CCM team and pcp to address needs related to anxiety  patient will demonstrate a decrease in anxiety exacerbations as evidenced by management of chronic conditions   Interventions:  1:1 collaboration with Jearld Fenton, NP regarding development and update of comprehensive plan of care as evidenced by provider attestation and co-signature Inter-disciplinary care team collaboration (see longitudinal plan of care) Evaluation of current treatment plan related to anxiety  and patient's adherence to plan as established by provider. 10-07-2020: The patient states his anxiety level is better as he has a definitive answer to his PD.  He is thankful he sought a second opinion.  The patient is doing much better and going to the gym daily. He feels like he has control of his chronic conditions. Will continue to monitor for changes. 12-13-2020: The patient is doing well with the management of his anxiety at this time. Denies any new concerns. Will continue to monitor for changes.  Advised patient to call the office for changes in mood/anxiety/ depressed state Provided education to patient re: anxiety management  Discussed plans with patient for ongoing care management follow up and provided patient with direct contact information for care management team  Patient Goals/Self-Care Activities patient will:  - Patient will self administer medications as prescribed Patient will attend all scheduled provider appointments Patient will call pharmacy for medication refills Patient will attend church or other social activities Patient will continue to perform  ADL's independently Patient will continue to perform IADL's independently Patient will call provider office for new concerns or questions Patient will work with BSW to address care coordination needs and will continue to work with the clinical team to address health care and disease management related needs.   - anxiety screen reviewed - depression  screen reviewed - somatic and anxiety symptoms correlated  Follow Up Plan: Telephone follow up appointment with care management team member scheduled for: 02-14-2021 at 145 pm       Task: RNCM: Identify Anxiety Symptoms and Facilitate Treatment Completed 12/13/2020  Outcome: Positive  Note:   Care Management Activities:    - anxiety screen reviewed - depression screen reviewed - somatic and anxiety symptoms correlated        Care Plan : RNCM: Hypothyroidism  Updates made by Vanita Ingles since 12/13/2020 12:00 AM     Problem: RNCM: Hypothyroidism   Priority: Medium     Long-Range Goal: RNCM: Hypothyroidsim   Start Date: 07/29/2020  Expected End Date: 07/29/2021  This Visit's Progress: On track  Priority: Medium  Note:   Current Barriers:  Chronic Disease Management support and education needs related to hypothyroidism  Lacks social connections Does not contact provider office for questions/concerns  Nurse Case Manager Clinical Goal(s):  patient will verbalize understanding of plan for effective management of hypothryoidism patient will work with RNCM and pcp  to address needs related to hypothyroidism  Interventions:  1:1 collaboration with Jearld Fenton, NP regarding development and update of comprehensive plan of care as evidenced by provider attestation and co-signature Inter-disciplinary care team collaboration (see longitudinal plan of care) Evaluation of current treatment plan related to hypothyroidism and patient's adherence to plan as established by provider. 12-13-2020: Review of compliance with medications and plan of care. The patient endorses compliance.  Advised patient to call for changes or questions Provided education to patient re: hypothyroidism and effective management  through emmi and my chart. 12-13-2020: The patient denies any new educational needs at this time.  Discussed plans with patient for ongoing care management follow up and provided patient  with direct contact information for care management team  Patient Goals/Self-Care Activities patient will:  - Patient will self administer medications as prescribed Patient will attend all scheduled provider appointments Patient will call pharmacy for medication refills Patient will attend church or other social activities Patient will continue to perform ADL's independently Patient will continue to perform IADL's independently Patient will call provider office for new concerns or questions Patient will work with BSW to address care coordination needs and will continue to work with the clinical team to address health care and disease management related needs.   - barriers to meeting goals identified - change-talk evoked - collaboration with team encouraged - decision-making supported - health risks reviewed - problem-solving facilitated - questions answered - readiness for change evaluated - reassurance provided - resources needed to meet goals identified - self-reflection promoted - self-reliance encouraged - verbalization of feelings encouraged  Follow Up Plan: Telephone follow up appointment with care management team member scheduled for: 02-14-2021 at 145 pm        Task: RNCM: Hypothyroidism Completed 12/13/2020  Outcome: Positive  Note:   Care Management Activities:    - barriers to meeting goals identified - change-talk evoked - collaboration with team encouraged - decision-making supported - health risks reviewed - problem-solving facilitated - questions answered - readiness for change evaluated - reassurance provided - resources needed to meet goals  identified - self-reflection promoted - self-reliance encouraged - verbalization of feelings encouraged         Plan:Telephone follow up appointment with care management team member scheduled for:  02-14-2021 at 145 pm  [place], MSN, [place] Mobile: [phone]

## 2020-12-14 DIAGNOSIS — R972 Elevated prostate specific antigen [PSA]: Secondary | ICD-10-CM | POA: Diagnosis not present

## 2020-12-14 DIAGNOSIS — E291 Testicular hypofunction: Secondary | ICD-10-CM | POA: Diagnosis not present

## 2020-12-14 DIAGNOSIS — N5201 Erectile dysfunction due to arterial insufficiency: Secondary | ICD-10-CM | POA: Diagnosis not present

## 2020-12-14 DIAGNOSIS — N401 Enlarged prostate with lower urinary tract symptoms: Secondary | ICD-10-CM | POA: Diagnosis not present

## 2020-12-15 ENCOUNTER — Telehealth: Payer: Self-pay

## 2020-12-15 DIAGNOSIS — R972 Elevated prostate specific antigen [PSA]: Secondary | ICD-10-CM | POA: Diagnosis not present

## 2020-12-15 NOTE — Telephone Encounter (Signed)
I called the patient back and he informed me that he talk to his Urologist a they order the labs he needed. He said he no longer need [place] to order the PSA and Testosterone labs.

## 2020-12-15 NOTE — Telephone Encounter (Signed)
Copied from [facility] [id number]. Topic: Appointment Scheduling - Scheduling Inquiry for Clinic >> Dec 15, 2020  8:41 AM Valere Dross wrote: Reason for CRM: Pt called in stating he is wanting to get his PSA and testosterone checked along with some labs, pt states he has went to two different places and got two different results ad ants to be sure. Pt requested a call back to get those labs ordered and make an appt. Please advise.

## 2020-12-16 DIAGNOSIS — M542 Cervicalgia: Secondary | ICD-10-CM | POA: Diagnosis not present

## 2020-12-17 ENCOUNTER — Other Ambulatory Visit: Payer: Self-pay | Admitting: Urology

## 2020-12-17 DIAGNOSIS — R972 Elevated prostate specific antigen [PSA]: Secondary | ICD-10-CM

## 2020-12-20 ENCOUNTER — Telehealth: Payer: Self-pay | Admitting: Internal Medicine

## 2020-12-20 NOTE — Telephone Encounter (Signed)
Todd Green Calling from [facility] is calling to follow up on PSA results - Wanting to know a recent PSA level? Please advise CB- [phone]

## 2020-12-21 ENCOUNTER — Other Ambulatory Visit: Payer: Self-pay

## 2020-12-21 DIAGNOSIS — E890 Postprocedural hypothyroidism: Secondary | ICD-10-CM

## 2020-12-21 MED ORDER — LEVOTHYROXINE SODIUM 137 MCG PO TABS: ORAL_TABLET | ORAL | 1 refills | Status: DC

## 2020-12-23 DIAGNOSIS — M542 Cervicalgia: Secondary | ICD-10-CM | POA: Diagnosis not present

## 2020-12-29 ENCOUNTER — Ambulatory Visit
Admission: RE | Admit: 2020-12-29 | Discharge: 2020-12-29 | Disposition: A | Discharge status: Home / Self Care | Payer: Medicare HMO | Source: Ambulatory Visit | Attending: Urology | Admitting: Urology

## 2020-12-29 ENCOUNTER — Other Ambulatory Visit: Payer: Self-pay

## 2020-12-29 DIAGNOSIS — R590 Localized enlarged lymph nodes: Secondary | ICD-10-CM | POA: Diagnosis not present

## 2020-12-29 DIAGNOSIS — R972 Elevated prostate specific antigen [PSA]: Secondary | ICD-10-CM | POA: Diagnosis not present

## 2020-12-29 DIAGNOSIS — N400 Enlarged prostate without lower urinary tract symptoms: Secondary | ICD-10-CM | POA: Diagnosis not present

## 2020-12-29 MED ORDER — GADOBUTROL 1 MMOL/ML IV SOLN
7.0000 mL | Freq: Once | INTRAVENOUS | Status: AC | PRN
  Administered 2020-12-29: 7 mL via INTRAVENOUS

## 2020-12-31 DIAGNOSIS — N401 Enlarged prostate with lower urinary tract symptoms: Secondary | ICD-10-CM | POA: Diagnosis not present

## 2020-12-31 DIAGNOSIS — D400 Neoplasm of uncertain behavior of prostate: Secondary | ICD-10-CM | POA: Diagnosis not present

## 2020-12-31 DIAGNOSIS — R972 Elevated prostate specific antigen [PSA]: Secondary | ICD-10-CM | POA: Diagnosis not present

## 2021-01-03 ENCOUNTER — Ambulatory Visit: Payer: Self-pay

## 2021-01-03 NOTE — Telephone Encounter (Signed)
Pt is calling because he felt light headed yesterday as if he was going to pass out. The last time that this happened was in July. Pt feels fine know. Just fighting a cold. Apt was scheduled for 01/10/21 Please advise  Pt. Reports he had another dizzy "spell yesterday." BP  109/78, 111/78. No other symptoms. States he will talk to his PCP at next visit about this. Answer Assessment - Initial Assessment Questions 1. DESCRIPTION: "Describe your dizziness."     Dizzy 2. LIGHTHEADED: "Do you feel lightheaded?" (e.g., somewhat faint, woozy, weak upon standing)     Dizzy 3. VERTIGO: "Do you feel like either you or the room is spinning or tilting?" (i.e. vertigo)     No 4. SEVERITY: "How bad is it?"  "Do you feel like you are going to faint?" "Can you stand and walk?"   - MILD: Feels slightly dizzy, but walking normally.   - MODERATE: Feels unsteady when walking, but not falling; interferes with normal activities (e.g., school, work).   - SEVERE: Unable to walk without falling, or requires assistance to walk without falling; feels like passing out now.      Mild 5. ONSET:  "When did the dizziness begin?"     1 month ago 6. AGGRAVATING FACTORS: "Does anything make it worse?" (e.g., standing, change in head position)     Changing position 7. HEART RATE: "Can you tell me your heart rate?" "How many beats in 15 seconds?"  (Note: not all patients can do this)       No 8. CAUSE: "What do you think is causing the dizziness?"     Unsure 9. RECURRENT SYMPTOM: "Have you had dizziness before?" If Yes, ask: "When was the last time?" "What happened that time?"     Yes 10. OTHER SYMPTOMS: "Do you have any other symptoms?" (e.g., fever, chest pain, vomiting, diarrhea, bleeding)       Yes 11. PREGNANCY: "Is there any chance you are pregnant?" "When was your last menstrual period?"       N/a  Protocols used: Dizziness - Lightheadedness-A-AH

## 2021-01-03 NOTE — H&P (Signed)
Todd Green, Todd Green MEDICAL RECORD NO: [id number] ACCOUNT NO: [id number] DATE OF BIRTH: 10-Nov-1950 PHYSICIAN: Otelia Limes. Yves Dill, MD  History and Physical   DATE OF ADMISSION: 01/06/2021  Same day surgery 01/06/2021.  CHIEF COMPLAINT:  Elevated PSA.  HISTORY OF PRESENT ILLNESS:  The patient is a 70 year old white male who was found to have an elevated PSA of 6.9 ng/mL, on August 19th.  He underwent an exosome study, which indicated an exosome IntelliScore of 18, which was above the cut off for high  risk of high-grade prostate cancer.  He underwent a prostate MRI scan revealing a 60.4 mL prostate with a PI-RADS category 4 lesion on the left side measuring 0.47 mL in size and PI-RADS category 3 lesion on the left side measuring 0.63 mL.  He comes in  now for UroNav fusion biopsy of the prostate.  PAST MEDICAL HISTORY: ALLERGIES:  INCLUDE SULFA.  CURRENT MEDICATIONS:  Diclofenac, fluticasone, levothyroxine, loratadine, multivitamins, omeprazole, Inderal, trazodone, triamcinolone, tadalafil, levodopa and carbidopa.  PAST SURGICAL HISTORY:  1.  Thyroidectomy 2013. 2.  C-spine repair 2018. 3.  UroLift 06/2020.  PAST AND CURRENT MEDICAL HISTORY. 1.  Parkinson's disease. 2.  GERD. 3.  Hypogonadism. 4.  Erectile dysfunction. 5.  Seasonal allergies. 6.  Tremor.  REVIEW OF SYSTEMS:  The patient has decreased visual and auditory acuity.  He denies chest pain, shortness of breath, diabetes, stroke or hypertension or heart disease.  SOCIAL HISTORY:  The patient denies tobacco or alcohol use.  FAMILY HISTORY:  Father died at age 49 of complications related to alcohol abuse.  Mother died at age 43 of uncertain medical problems.  There is no family history of prostate cancer.  PHYSICAL EXAMINATION: VITAL SIGNS:  Height 5 feet 8 inches, weight 186 pounds, BMI 28. GENERAL:  Well-nourished white male in no acute distress. HEENT:  Sclerae were clear.  Pupils are equally round, reactive to  light and accommodation.  Extraocular movements were intact. NECK:  No palpable masses or tenderness.  LYMPHATIC:  No palpable cervical or inguinal adenopathy. PULMONARY:  Lungs are clear to auscultation. CARDIOVASCULAR:  Regular rhythm and rate. ABDOMEN:  Soft and nontender abdomen.  No CVA tenderness. GENITOURINARY:  Uncircumcised.  Testes were atrophic.  Approximately 15 mL in size each.  Rectal exam 40 gram smooth and nontender prostate. NEUROLOGIC:  Alert and oriented x3.  IMPRESSION: 1.  Elevated PSA. 2.  Abnormal prostate MRI scan.  PLAN:  UroNav fusion biopsy of the prostate.   PUS D: 12/31/2020 11:50:53 am T: 12/31/2020 12:48:00 pm  JOB: [id number]

## 2021-01-05 ENCOUNTER — Other Ambulatory Visit: Payer: Self-pay

## 2021-01-05 ENCOUNTER — Telehealth: Payer: Self-pay

## 2021-01-05 ENCOUNTER — Other Ambulatory Visit
Admission: RE | Admit: 2021-01-05 | Discharge: 2021-01-05 | Disposition: A | Discharge status: Home / Self Care | Payer: Medicare HMO | Source: Ambulatory Visit | Attending: Urology | Admitting: Urology

## 2021-01-05 HISTORY — DX: Unspecified osteoarthritis, unspecified site: M19.90

## 2021-01-05 HISTORY — DX: Personal history of urinary calculi: Z87.442

## 2021-01-05 NOTE — Patient Instructions (Addendum)
Your procedure is scheduled on: 01/06/21  Report to the Registration Desk on the 1st floor of the [facility]. To find out your arrival time, please call [phone] between 1PM - 3PM on: 01/05/21   REMEMBER: Instructions that are not followed completely may result in serious medical risk, up to and including death; or upon the discretion of your surgeon and anesthesiologist your surgery may need to be rescheduled.  Do not eat food after midnight the night before surgery.  No gum chewing, lozengers or hard candies.  You may however, drink CLEAR liquids up to 2 hours before you are scheduled to arrive for your surgery. Do not drink anything within 2 hours of your scheduled arrival time.  Clear liquids include: - water  - apple juice without pulp - gatorade (not RED, PURPLE, OR BLUE) - black coffee or tea (Do NOT add milk or creamers to the coffee or tea) Do NOT drink anything that is not on this list.  TAKE THESE MEDICATIONS THE MORNING OF SURGERY WITH A SIP OF WATER:  - carbidopa-levodopa (SINEMET IR) 25-100 MG tablet - fluticasone (FLONASE) 50 MCG/ACT nasal spray - omeprazole (PRILOSEC) 40 MG capsule, (take one the night before and one on the morning of surgery - helps to prevent nausea after surgery.)  One week prior to surgery: Stop Anti-inflammatories (NSAIDS) such as Advil, Aleve, Ibuprofen, Motrin, Naproxen, Naprosyn and Aspirin based products such as Excedrin, Goodys Powder, BC Powder.  Stop ANY OVER THE COUNTER supplements until after surgery.  You may take Tylenol if needed for pain up until the day of surgery.  No Alcohol for 24 hours before or after surgery.  No Smoking including e-cigarettes for 24 hours prior to surgery.  No chewable tobacco products for at least 6 hours prior to surgery.  No nicotine patches on the day of surgery.  Do not use any "recreational" drugs for at least a week prior to your surgery.  Please be advised that the combination of cocaine  and anesthesia may have negative outcomes, up to and including death. If you test positive for cocaine, your surgery will be cancelled.  On the morning of surgery brush your teeth with toothpaste and water, you may rinse your mouth with mouthwash if you wish. Do not swallow any toothpaste or mouthwash.  Do not wear jewelry, make-up, hairpins, clips or nail polish.  Do not wear lotions, powders, or perfumes.   Do not shave body from the neck down 48 hours prior to surgery just in case you cut yourself which could leave a site for infection.  Also, freshly shaved skin may become irritated if using the CHG soap.  Contact lenses, hearing aids and dentures may not be worn into surgery.  Do not bring valuables to the hospital. [facility] is not responsible for any missing/lost belongings or valuables.   Fleets enema or bowel prep as directed.  Notify your doctor if there is any change in your medical condition (cold, fever, infection).  Wear comfortable clothing (specific to your surgery type) to the hospital.  After surgery, you can help prevent lung complications by doing breathing exercises.  Take deep breaths and cough every 1-2 hours. Your doctor may order a device called an Incentive Spirometer to help you take deep breaths. When coughing or sneezing, hold a pillow firmly against your incision with both hands. This is called "splinting." Doing this helps protect your incision. It also decreases belly discomfort.  If you are being admitted to the hospital  overnight, leave your suitcase in the car. After surgery it may be brought to your room.  If you are being discharged the day of surgery, you will not be allowed to drive home. You will need a responsible adult (18 years or older) to drive you home and stay with you that night.   If you are taking public transportation, you will need to have a responsible adult (18 years or older) with you. Please confirm with your physician that  it is acceptable to use public transportation.   Please call the [facility] at [phone] if you have any questions about these instructions.  Surgery Visitation Policy:  Patients undergoing a surgery or procedure may have one family member or support person with them as long as that person is not COVID-19 positive or experiencing its symptoms.  That person may remain in the waiting area during the procedure.  Inpatient Visitation:    Visiting hours are 7 a.m. to 8 p.m. Inpatients will be allowed two visitors daily. The visitors may change each day during the patient's stay. No visitors under the age of 1. Any visitor under the age of 54 must be accompanied by an adult. The visitor must pass COVID-19 screenings, use hand sanitizer when entering and exiting the patient's room and wear a mask at all times, including in the patient's room. Patients must also wear a mask when staff or their visitor are in the room. Masking is required regardless of vaccination status.

## 2021-01-06 ENCOUNTER — Ambulatory Visit: Payer: Medicare HMO | Admitting: Certified Registered"

## 2021-01-06 ENCOUNTER — Ambulatory Visit
Admission: RE | Admit: 2021-01-06 | Discharge: 2021-01-06 | Disposition: A | Discharge status: Home / Self Care | Payer: Medicare HMO | Attending: Urology | Admitting: Urology

## 2021-01-06 ENCOUNTER — Encounter
Admission: RE | Disposition: A | Discharge status: Home / Self Care | Payer: Self-pay | Source: Home / Self Care | Attending: Urology

## 2021-01-06 ENCOUNTER — Encounter: Payer: Self-pay | Admitting: Urology

## 2021-01-06 ENCOUNTER — Other Ambulatory Visit: Payer: Self-pay

## 2021-01-06 DIAGNOSIS — G20 Parkinson's disease: Secondary | ICD-10-CM | POA: Insufficient documentation

## 2021-01-06 DIAGNOSIS — K219 Gastro-esophageal reflux disease without esophagitis: Secondary | ICD-10-CM | POA: Diagnosis not present

## 2021-01-06 DIAGNOSIS — E890 Postprocedural hypothyroidism: Secondary | ICD-10-CM | POA: Diagnosis not present

## 2021-01-06 DIAGNOSIS — R9389 Abnormal findings on diagnostic imaging of other specified body structures: Secondary | ICD-10-CM | POA: Diagnosis not present

## 2021-01-06 DIAGNOSIS — Z01818 Encounter for other preprocedural examination: Secondary | ICD-10-CM | POA: Diagnosis not present

## 2021-01-06 DIAGNOSIS — Z87442 Personal history of urinary calculi: Secondary | ICD-10-CM | POA: Insufficient documentation

## 2021-01-06 DIAGNOSIS — R6889 Other general symptoms and signs: Secondary | ICD-10-CM | POA: Diagnosis not present

## 2021-01-06 DIAGNOSIS — Z79899 Other long term (current) drug therapy: Secondary | ICD-10-CM | POA: Diagnosis not present

## 2021-01-06 DIAGNOSIS — R972 Elevated prostate specific antigen [PSA]: Secondary | ICD-10-CM | POA: Diagnosis not present

## 2021-01-06 DIAGNOSIS — Z882 Allergy status to sulfonamides status: Secondary | ICD-10-CM | POA: Diagnosis not present

## 2021-01-06 HISTORY — PX: PROSTATE BIOPSY: SHX241

## 2021-01-06 SURGERY — BIOPSY, PROSTATE
Anesthesia: General

## 2021-01-06 MED ORDER — GENTAMICIN IN SALINE 1.6-0.9 MG/ML-% IV SOLN
80.0000 mg | Freq: Once | INTRAVENOUS | Status: AC
  Administered 2021-01-06: 80 mg via INTRAVENOUS
  Filled 2021-01-06: qty 50

## 2021-01-06 MED ORDER — ONDANSETRON HCL 4 MG/2ML IJ SOLN
INTRAMUSCULAR | Status: DC | PRN
  Administered 2021-01-06: 4 mg via INTRAVENOUS

## 2021-01-06 MED ORDER — PROPOFOL 500 MG/50ML IV EMUL
INTRAVENOUS | Status: AC
  Filled 2021-01-06: qty 100

## 2021-01-06 MED ORDER — GENTAMICIN SULFATE 40 MG/ML IJ SOLN: 80.0000 mg | INTRAVENOUS | Status: DC

## 2021-01-06 MED ORDER — GENTAMICIN SULFATE 40 MG/ML IJ SOLN
80.0000 mg | Freq: Once | INTRAVENOUS | Status: DC
  Filled 2021-01-06: qty 2

## 2021-01-06 MED ORDER — MIDAZOLAM HCL 2 MG/2ML IJ SOLN
INTRAMUSCULAR | Status: AC
  Filled 2021-01-06: qty 2

## 2021-01-06 MED ORDER — FENTANYL CITRATE (PF) 100 MCG/2ML IJ SOLN: 25.0000 ug | INTRAMUSCULAR | Status: DC | PRN

## 2021-01-06 MED ORDER — OXYCODONE HCL 5 MG PO TABS: 5.0000 mg | ORAL_TABLET | Freq: Once | ORAL | Status: DC | PRN

## 2021-01-06 MED ORDER — CEFAZOLIN SODIUM-DEXTROSE 1-4 GM/50ML-% IV SOLN: 1.0000 g | Freq: Once | INTRAVENOUS | Status: AC

## 2021-01-06 MED ORDER — OXYCODONE HCL 5 MG/5ML PO SOLN
5.0000 mg | Freq: Once | ORAL | Status: DC | PRN
Start: 2021-01-06 — End: 2021-01-06

## 2021-01-06 MED ORDER — ORAL CARE MOUTH RINSE: 15.0000 mL | Freq: Once | OROMUCOSAL | Status: AC

## 2021-01-06 MED ORDER — LEVOFLOXACIN 500 MG PO TABS: 500.0000 mg | ORAL_TABLET | Freq: Every day | ORAL | 0 refills | Status: DC

## 2021-01-06 MED ORDER — SODIUM CHLORIDE 0.9 % IV SOLN
80.0000 mg | Freq: Once | INTRAVENOUS | Status: DC
  Filled 2021-01-06: qty 2

## 2021-01-06 MED ORDER — ACETAMINOPHEN 10 MG/ML IV SOLN
INTRAVENOUS | Status: DC | PRN
  Administered 2021-01-06: 1000 mg via INTRAVENOUS

## 2021-01-06 MED ORDER — GLYCOPYRROLATE 0.2 MG/ML IJ SOLN
INTRAMUSCULAR | Status: DC | PRN
  Administered 2021-01-06: .2 mg via INTRAVENOUS

## 2021-01-06 MED ORDER — CEFAZOLIN SODIUM-DEXTROSE 1-4 GM/50ML-% IV SOLN
1.0000 g | Freq: Once | INTRAVENOUS | Status: AC
  Administered 2021-01-06: 1 g via INTRAVENOUS

## 2021-01-06 MED ORDER — FENTANYL CITRATE (PF) 100 MCG/2ML IJ SOLN
INTRAMUSCULAR | Status: AC
  Filled 2021-01-06: qty 2

## 2021-01-06 MED ORDER — DEXAMETHASONE SODIUM PHOSPHATE 10 MG/ML IJ SOLN
INTRAMUSCULAR | Status: DC | PRN
  Administered 2021-01-06: 10 mg via INTRAVENOUS

## 2021-01-06 MED ORDER — PROPOFOL 10 MG/ML IV BOLUS
INTRAVENOUS | Status: DC | PRN
  Administered 2021-01-06: 120 mg via INTRAVENOUS

## 2021-01-06 MED ORDER — CHLORHEXIDINE GLUCONATE 0.12 % MT SOLN: 15.0000 mL | Freq: Once | OROMUCOSAL | Status: AC

## 2021-01-06 MED ORDER — ROCURONIUM BROMIDE 100 MG/10ML IV SOLN
INTRAVENOUS | Status: DC | PRN
  Administered 2021-01-06: 30 mg via INTRAVENOUS

## 2021-01-06 MED ORDER — LACTATED RINGERS IV SOLN: INTRAVENOUS | Status: DC

## 2021-01-06 MED ORDER — ONDANSETRON HCL 4 MG/2ML IJ SOLN: 4.0000 mg | Freq: Once | INTRAMUSCULAR | Status: DC | PRN

## 2021-01-06 MED ORDER — CHLORHEXIDINE GLUCONATE 0.12 % MT SOLN
OROMUCOSAL | Status: AC
  Administered 2021-01-06: 15 mL via OROMUCOSAL
  Filled 2021-01-06: qty 15

## 2021-01-06 MED ORDER — SUCCINYLCHOLINE CHLORIDE 200 MG/10ML IV SOSY
PREFILLED_SYRINGE | INTRAVENOUS | Status: DC | PRN
Start: 2021-01-06 — End: 2021-01-06
  Administered 2021-01-06: 100 mg via INTRAVENOUS

## 2021-01-06 MED ORDER — LIDOCAINE HCL (CARDIAC) PF 100 MG/5ML IV SOSY
PREFILLED_SYRINGE | INTRAVENOUS | Status: DC | PRN
  Administered 2021-01-06: 60 mg via INTRAVENOUS

## 2021-01-06 MED ORDER — CEFAZOLIN SODIUM-DEXTROSE 1-4 GM/50ML-% IV SOLN
INTRAVENOUS | Status: AC
  Administered 2021-01-06: 1 g via INTRAVENOUS
  Filled 2021-01-06: qty 50

## 2021-01-06 MED ORDER — MIDAZOLAM HCL 2 MG/2ML IJ SOLN
INTRAMUSCULAR | Status: DC | PRN
  Administered 2021-01-06: 2 mg via INTRAVENOUS

## 2021-01-06 SURGICAL SUPPLY — 25 items
COVER MAYO STAND REUSABLE (DRAPES) ×2 IMPLANT
COVER TRANSDUCER ULTRASOUND (MISCELLANEOUS) ×1 IMPLANT
DRSG TELFA 3X8 NADH (GAUZE/BANDAGES/DRESSINGS) ×2 IMPLANT
FEE DELIVERY LASER CO2 FORTEC (MISCELLANEOUS) IMPLANT
GLOVE SURG ENC MOIS LTX SZ7 (GLOVE) ×4 IMPLANT
GUIDE NDL ENDOCAV 16-18 CVR (NEEDLE) IMPLANT
GUIDE NEEDLE ENDOCAV 16-18 CVR (NEEDLE) IMPLANT
INST BIOPSY MAXCORE 18GX25 (NEEDLE) ×2 IMPLANT
LASER CO2 FORTEC DELIVERY FEE (MISCELLANEOUS) ×2 IMPLANT
LASER XPS ACCESS DROP OFF FEE (MISCELLANEOUS) ×1 IMPLANT
MANIFOLD NEPTUNE II (INSTRUMENTS) ×1 IMPLANT
NDL BIO TRUPATH DISP 18GX25 (MISCELLANEOUS) IMPLANT
NDL GUIDE BIOPSY 644068 (NEEDLE) IMPLANT
NEEDLE BIO TRUPATH DISP 18GX25 (MISCELLANEOUS) ×2 IMPLANT
NEEDLE GUIDE BIOPSY 644068 (NEEDLE) IMPLANT
PAD DRESSING TELFA 3X8 NADH (GAUZE/BANDAGES/DRESSINGS) IMPLANT
PROBE BIOSP ALOKA ALPHA6 PROST (MISCELLANEOUS) ×1 IMPLANT
PROBE URONAV BK 8808E 8818 HLD (MISCELLANEOUS) IMPLANT
STRAP SAFETY 5IN WIDE (MISCELLANEOUS) ×2 IMPLANT
SURGILUBE 2OZ TUBE FLIPTOP (MISCELLANEOUS) ×2 IMPLANT
TOWEL OR 17X26 4PK STRL BLUE (TOWEL DISPOSABLE) ×2 IMPLANT
URONAV BK 8808E 8818 PROBE HLD (MISCELLANEOUS) ×2
URONAV MRI FUSION TWO PATIENTS (MISCELLANEOUS) ×1 IMPLANT
URONAV ULTRASOUND (MISCELLANEOUS) ×1 IMPLANT
WATER STERILE IRR 500ML POUR (IV SOLUTION) ×2 IMPLANT

## 2021-01-06 NOTE — Transfer of Care (Signed)
Immediate Anesthesia Transfer of Care Note  Patient: Todd Green  Procedure(s) Performed: PROSTATE BIOPSY URONAV  Patient Location: PACU  Anesthesia Type:General  Level of Consciousness: awake, drowsy and patient cooperative  Airway & Oxygen Therapy: Patient Spontanous Breathing and Patient connected to face mask oxygen  Post-op Assessment: Report given to RN and Post -op Vital signs reviewed and stable  Post vital signs: Reviewed and stable  Last Vitals:  Vitals Value Taken Time  BP 136/82 01/06/21 0815  Temp    Pulse 90 01/06/21 0815  Resp 24 01/06/21 0815  SpO2 100 % 01/06/21 0815  Vitals shown include unvalidated device data.  Last Pain:  Vitals:   01/06/21 0729  TempSrc: Temporal  PainSc: 0-No pain      Patients Stated Pain Goal: 0 ([id number])  Complications: No notable events documented.

## 2021-01-06 NOTE — Anesthesia Procedure Notes (Signed)
Procedure Name: Intubation Date/Time: 01/06/2021 7:41 AM Performed by: Kelton Pillar, CRNA Pre-anesthesia Checklist: Patient identified, Emergency Drugs available, Suction available and Patient being monitored Patient Re-evaluated:Patient Re-evaluated prior to induction Oxygen Delivery Method: Circle system utilized Preoxygenation: Pre-oxygenation with 100% oxygen Induction Type: IV induction Ventilation: Mask ventilation without difficulty Laryngoscope Size: McGraph and 3 Grade View: Grade I Tube type: Oral Tube size: 7.0 mm Number of attempts: 1 Airway Equipment and Method: Stylet and Oral airway Placement Confirmation: ETT inserted through vocal cords under direct vision, positive ETCO2, breath sounds checked- equal and bilateral and CO2 detector Secured at: 21 cm Tube secured with: Tape Dental Injury: Teeth and Oropharynx as per pre-operative assessment

## 2021-01-06 NOTE — H&P (Signed)
Date of Initial H&P: 12/31/20  History reviewed, patient examined, no change in status, stable for surgery.

## 2021-01-06 NOTE — Discharge Instructions (Addendum)
Transrectal Ultrasound-Guided Prostate Biopsy, Care After This sheet gives you information about how to care for yourself after your procedure. Your doctor may also give you more specific instructions. If you have problems or questions, contact your doctor. What can I expect after the procedure? After the procedure, it is common to have: Pain and discomfort in your butt, especially while sitting. Pink-colored pee (urine), due to small amounts of blood in the pee. Burning while peeing (urinating). Blood in your poop (stool). Bleeding from your butt. Blood in your semen. Follow these instructions at home: Medicines Take over-the-counter and prescription medicines only as told by your doctor. If you were prescribed antibiotic medicine, take it as told by your doctor. Do not stop taking the antibiotic even if you start to feel better. Activity Do not drive for 24 hours if you were given a medicine to help you relax (sedative) during your procedure. Return to your normal activities as told by your doctor. Ask your doctor what activities are safe for you. Ask your doctor when it is okay for you to have sex. Do not lift anything that is heavier than 10 lb (4.5 kg), or the limit that you are told, until your doctor says that it is safe. General instructions Drink enough water to keep your pee pale yellow. Watch your pee, poop, and semen for new bleeding or bleeding that gets worse. Keep all follow-up visits as told by your doctor. This is important. Contact a doctor if you: Have blood clots in your pee or poop. Notice that your pee smells bad or unusual. Have very bad belly pain. Have trouble peeing. Notice that your lower belly feels firm. Have blood in your pee for more than 2 weeks after the procedure. Have blood in your semen for more than 2 months after the procedure. Have problems getting an erection. Feel sick to your stomach (nauseous) or throw up (vomit). Have new or worse bleeding  in your pee, poop, or semen. Get help right away if you: Have a fever or chills. Have bright red pee. Have very bad pain that does not get better with medicine. Cannot pee. Summary After this procedure, it is common to have pain and discomfort around your butt, especially while sitting. You may have blood in your pee and poop. It is common to have blood in your semen for 1-2 months. If you were prescribed antibiotic medicine, take it as told by your doctor. Do not stop taking the antibiotic even if you start to feel better. Get help right away if you have a fever or chills. This information is not intended to replace advice given to you by your health care provider. Make sure you discuss any questions you have with your health care provider. Document Revised: 02/23/2020 Document Reviewed: 12/25/2019 Elsevier Patient Education  2022 [place]   The drugs that you were given will stay in your system until tomorrow so for the next 24 hours you should not:  Drive an automobile Make any legal decisions Drink any alcoholic beverage   You may resume regular meals tomorrow.  Today it is better to start with liquids and gradually work up to solid foods.  You may eat anything you prefer, but it is better to start with liquids, then soup and crackers, and gradually work up to solid foods.   Please notify your doctor immediately if you have any unusual bleeding, trouble breathing, redness and pain at the surgery site,  drainage, fever, or pain not relieved by medication.    Your post-operative visit with Dr.                                       is: Date:                        Time:    Please call to schedule your post-operative visit.  Additional Instructions:

## 2021-01-06 NOTE — Op Note (Signed)
Preoperative diagnosis: 1.  Elevated PSA (R97.2)                                           2.  Abnormal prostate gland MRI scan (D40.0)  Postoperative diagnosis: Same  Procedure: Uronav fusion transrectal ultrasound-guided needle biopsy of the prostate gland (CPT 55700, [phone])  Surgeon: Otelia Limes. Yves Dill MD  Anesthesia: General  Indications:See the history and physical. After informed consent the above procedure(s) were requested     Technique and findings: After adequate general anesthesia been obtained patient was placed into the left lateral decubitus position.  DRE was performed and the rectal vault noted to be clear.  The ultrasound probe was then placed and prostate images acquired.  The ultrasound images were then fused with the MRI images.  The region of interest #1 PIRAD category 4 was identified and 4 core biopsies taken here.  The region of interest #2 PIRAD category 3 lesion was identified and 4 core biopsies obtained here.  At this point standard 12 core systematic biopsies were performed.  The ultrasound probe was then removed.  Blood loss was minimal.  The patient tolerated the procedure well and was transported to the recovery room in stable condition.

## 2021-01-06 NOTE — Anesthesia Preprocedure Evaluation (Addendum)
Anesthesia Evaluation  Patient identified by MRN, date of birth, ID band Patient awake    Reviewed: Allergy & Precautions, NPO status , Patient's Chart, lab work & pertinent test results  Airway Mallampati: III  TM Distance: >3 FB Neck ROM: Limited  Mouth opening: Limited Mouth Opening Comment: Very limited cervical extension  Dental  (+) Partial Lower, Partial Upper   Pulmonary neg pulmonary ROS,    Pulmonary exam normal        Cardiovascular Exercise Tolerance: Good negative cardio ROS Normal cardiovascular exam     Neuro/Psych PSYCHIATRIC DISORDERS Anxiety negative neurological ROS     GI/Hepatic Neg liver ROS, GERD  Controlled,  Endo/Other  Hypothyroidism   Renal/GU Renal disease (renal stones)  negative genitourinary   Musculoskeletal  (+) Arthritis ,   Abdominal   Peds negative pediatric ROS (+)  Hematology negative hematology ROS (+)   Anesthesia Other Findings   Reproductive/Obstetrics negative OB ROS                            Anesthesia Physical Anesthesia Plan  ASA: 2  Anesthesia Plan: General   Post-op Pain Management:    Induction:   PONV Risk Score and Plan:   Airway Management Planned: Oral ETT  Additional Equipment:   Intra-op Plan:   Post-operative Plan:   Informed Consent:   Plan Discussed with:   Anesthesia Plan Comments: (Plan GETA due to surgeon requesting paralysis Video laryngoscopy PIV x 1, standard ASA monitors  )     Anesthesia Quick Evaluation

## 2021-01-06 NOTE — Anesthesia Postprocedure Evaluation (Signed)
Anesthesia Post Note  Patient: Todd Green  Procedure(s) Performed: PROSTATE BIOPSY URONAV  Patient location during evaluation: PACU Anesthesia Type: General Level of consciousness: awake and alert Pain management: pain level controlled Vital Signs Assessment: post-procedure vital signs reviewed and stable Respiratory status: spontaneous breathing, nonlabored ventilation and respiratory function stable Cardiovascular status: blood pressure returned to baseline and stable Postop Assessment: no apparent nausea or vomiting Anesthetic complications: no   No notable events documented.   Last Vitals:  Vitals:   01/06/21 0853 01/06/21 0954  BP: (!) 142/87 114/80  Pulse: 86 85  Resp: 20 16  Temp: 36.6 C (!) 36.4 C  SpO2: 96% 96%    Last Pain:  Vitals:   01/06/21 0853  TempSrc: Temporal  PainSc: 0-No pain                 Brett Canales Haward Pope

## 2021-01-07 LAB — SURGICAL PATHOLOGY

## 2021-01-10 ENCOUNTER — Telehealth: Payer: Self-pay | Admitting: Licensed Clinical Social Worker

## 2021-01-10 ENCOUNTER — Other Ambulatory Visit: Payer: Self-pay

## 2021-01-10 ENCOUNTER — Ambulatory Visit (INDEPENDENT_AMBULATORY_CARE_PROVIDER_SITE_OTHER): Payer: Medicare HMO | Admitting: Internal Medicine

## 2021-01-10 ENCOUNTER — Encounter: Payer: Self-pay | Admitting: Internal Medicine

## 2021-01-10 VITALS — BP 127/70 | HR 76 | Temp 98.0°F | Resp 17 | Ht 69.5 in | Wt 170.8 lb

## 2021-01-10 DIAGNOSIS — R42 Dizziness and giddiness: Secondary | ICD-10-CM

## 2021-01-10 DIAGNOSIS — M542 Cervicalgia: Secondary | ICD-10-CM

## 2021-01-10 DIAGNOSIS — R972 Elevated prostate specific antigen [PSA]: Secondary | ICD-10-CM | POA: Diagnosis not present

## 2021-01-10 DIAGNOSIS — G8929 Other chronic pain: Secondary | ICD-10-CM

## 2021-01-10 DIAGNOSIS — Z23 Encounter for immunization: Secondary | ICD-10-CM

## 2021-01-10 NOTE — Addendum Note (Signed)
Addended by: Wilson Singer on: 01/10/2021 02:28 PM   Modules accepted: Orders
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
## 2021-01-10 NOTE — Telephone Encounter (Signed)
    Clinical Social Work  Care Management   Phone Outreach    01/10/2021 Name: Todd Green MRN: [id number] DOB: 24-Oct-1950  Todd Green is a 70 y.o. year old male who is a primary care patient of Serena Colonel, MD .   Reason for referral: [facility].    F/U phone call today to assess needs, progress and barriers with care plan goals.   Telephone outreach was unsuccessful. A HIPPA compliant phone message was left for the patient providing contact information and requesting a return call.   Plan:CCM LCSW will wait for return call. If no return call is received, Will reach out to patient again in the next 30 days .   Review of patient status, including review of consultants reports, relevant laboratory and other test results, and collaboration with appropriate care team members and the patient's provider was performed as part of comprehensive patient evaluation and provision of care management services.     Christa See, MSW, [facility] Care Management [email or web address] Phone [phone] 6:11 AM

## 2021-01-10 NOTE — Assessment & Plan Note (Signed)
Amb ref to physical medication and rehab for consideration of neck injection

## 2021-01-10 NOTE — Patient Instructions (Signed)
Hypoglycemia Hypoglycemia is when the sugar (glucose) level in your blood is too low. Low blood sugar can happen to people who have diabetes and people who do not have diabetes. Low blood sugar can happen quickly, and it can be an emergency. What are the causes? This condition happens most often in people who have diabetes. It may be caused by: Diabetes medicine. Not eating enough, or not eating often enough. Doing more physical activity. Drinking alcohol on an empty stomach. If you do not have diabetes, this condition may be caused by: A tumor in the pancreas. Not eating enough, or not eating for long periods at a time (fasting). A very bad infection or illness. Problems after having weight loss (bariatric) surgery. Kidney failure or liver failure. Certain medicines. What increases the risk? This condition is more likely to develop in people who: Have diabetes and take medicines to lower their blood sugar. Abuse alcohol. Have a very bad illness. What are the signs or symptoms? Mild Hunger. Sweating and feeling clammy. Feeling dizzy or light-headed. Being sleepy or having trouble sleeping. Feeling like you may vomit (nauseous). A fast heartbeat. A headache. Blurry vision. Mood changes, such as: Being grouchy. Feeling worried or nervous (anxious). Tingling or loss of feeling (numbness) around your mouth, lips, or tongue. Moderate Confusion and poor judgment. Behavior changes. Weakness. Uneven heartbeat. Trouble with moving (coordination). Very low Very low blood sugar (severe hypoglycemia) is a medical emergency. It can cause: Fainting. Seizures. Loss of consciousness (coma). Death. How is this treated? Treating low blood sugar Low blood sugar is often treated by eating or drinking something that has sugar in it right away. The food or drink should contain 15 grams of a fast-acting carb (carbohydrate). Options include: 4 oz (120 mL) of fruit juice. 4 oz (120 mL) of  regular soda (not diet soda). A few pieces of hard candy. Check food labels to see how many pieces to eat for 15 grams. 1 Tbsp (15 mL) of sugar or honey. 4 glucose tablets. 1 tube of glucose gel. Treating low blood sugar if you have diabetes If you can think clearly and swallow safely, follow the 15:15 rule: Take 15 grams of a fast-acting carb. Talk with your doctor about how much you should take. Always keep a source of fast-acting carb with you, such as: Glucose tablets (take 4 tablets). A few pieces of hard candy. Check food labels to see how many pieces to eat for 15 grams. 4 oz (120 mL) of fruit juice. 4 oz (120 mL) of regular soda (not diet soda). 1 Tbsp (15 mL) of honey or sugar. 1 tube of glucose gel. Check your blood sugar 15 minutes after you take the carb. If your blood sugar is still at or below 70 mg/dL (3.9 mmol/L), take 15 grams of a carb again. If your blood sugar does not go above 70 mg/dL (3.9 mmol/L) after 3 tries, get help right away. After your blood sugar goes back to normal, eat a meal or a snack within 1 hour.  Treating very low blood sugar If your blood sugar is below 54 mg/dL (3 mmol/L), you have very low blood sugar, or severe hypoglycemia. This is an emergency. Get medical help right away. If you have very low blood sugar and you cannot eat or drink, you will need to be given a hormone called glucagon. A family member or friend should learn how to check your blood sugar and how to give you glucagon. Ask your doctor if  to have an emergency glucagon kit at home. Very low blood sugar may also need to be treated in a hospital. Follow these instructions at home: General instructions Take over-the-counter and prescription medicines only as told by your doctor. Stay aware of your blood sugar as told by your doctor. If you drink alcohol: Limit how much you have to: 0-1 drink a day for women who are not pregnant. 0-2 drinks a day for men. Know how much  alcohol is in your drink. In the [place] one drink equals one 12 oz bottle of beer (355 mL), one 5 oz glass of wine (148 mL), or one 1 oz glass of hard liquor (44 mL). Be sure to eat food when you drink alcohol. Know that your body absorbs alcohol quickly. This may lead to low blood sugar later. Be sure to keep checking your blood sugar. Keep all follow-up visits. If you have diabetes:  Always have a fast-acting carb (15 grams) with you to treat low blood sugar. Follow your diabetes care plan as told by your doctor. Make sure you: Know the symptoms of low blood sugar. Check your blood sugar as often as told. Always check it before and after exercise. Always check your blood sugar before you drive. Take your medicines as told. Follow your meal plan. Eat on time. Do not skip meals. Share your diabetes care plan with: Your work or school. People you live with. Carry a card or wear jewelry that says you have diabetes.  Where to find more information American Diabetes Association: www.diabetes.org Contact a doctor if: You have trouble keeping your blood sugar in your target range. You have low blood sugar often. Get help right away if: You still have symptoms after you eat or drink something that contains 15 grams of fast-acting carb, and you cannot get your blood sugar above 70 mg/dL by following the 15:15 rule. Your blood sugar is below 54 mg/dL (3 mmol/L). You have a seizure. You faint. These symptoms may be an emergency. Get help right away. Call your local emergency services (911 in the [place] Do not wait to see if the symptoms will go away. Do not drive yourself to the hospital. Summary Hypoglycemia happens when the level of sugar (glucose) in your blood is too low. Low blood sugar can happen to people who have diabetes and people who do not have diabetes. Low blood sugar can happen quickly, and it can be an emergency. Make sure you know the symptoms of low blood sugar and know how  to treat it. Always keep a source of sugar (fast-acting carb) with you to treat low blood sugar. This information is not intended to replace advice given to you by your health care provider. Make sure you discuss any questions you have with your healthcare provider. Document Revised: 03/11/2020 Document Reviewed: 03/11/2020 Elsevier Patient Education  2022 Elsevier Inc.  

## 2021-01-12 DIAGNOSIS — E291 Testicular hypofunction: Secondary | ICD-10-CM | POA: Diagnosis not present

## 2021-01-12 DIAGNOSIS — R972 Elevated prostate specific antigen [PSA]: Secondary | ICD-10-CM | POA: Diagnosis not present

## 2021-01-12 DIAGNOSIS — N401 Enlarged prostate with lower urinary tract symptoms: Secondary | ICD-10-CM | POA: Diagnosis not present

## 2021-01-12 DIAGNOSIS — Z79899 Other long term (current) drug therapy: Secondary | ICD-10-CM | POA: Diagnosis not present

## 2021-01-13 DIAGNOSIS — M542 Cervicalgia: Secondary | ICD-10-CM | POA: Diagnosis not present

## 2021-01-19 DIAGNOSIS — M7918 Myalgia, other site: Secondary | ICD-10-CM | POA: Diagnosis not present

## 2021-01-19 DIAGNOSIS — M542 Cervicalgia: Secondary | ICD-10-CM | POA: Diagnosis not present

## 2021-01-27 ENCOUNTER — Telehealth: Payer: Self-pay

## 2021-01-27 DIAGNOSIS — M542 Cervicalgia: Secondary | ICD-10-CM | POA: Diagnosis not present

## 2021-01-27 NOTE — Chronic Care Management (AMB) (Signed)
  Care Management   Note  01/27/2021 Name: Todd Green MRN: [id number] DOB: [birth year]  Todd Green is a 70 y.o. year old male who is a primary care patient of Serena Colonel, MD and is actively engaged with the care management team. I reached out to [name] by phone today to assist with re-scheduling a follow up visit with the RN Case Manager  Follow up plan: Telephone appointment with care management team member scheduled for:02/24/2021  Noreene Larsson, [place], [place], [place] [address] Direct Dial: [phone] [email or web address]: [email or web address]

## 2021-01-27 NOTE — Chronic Care Management (AMB) (Signed)
  Care Management   Note  01/27/2021 Name: Sayvion Vigen MRN: [id number] DOB: 1950/12/02  Bertin Inabinet is a 70 y.o. year old male who is a primary care patient of Serena Colonel, MD and is actively engaged with the care management team. I reached out to [name] by phone today to assist with re-scheduling a follow up visit with the RN Case Manager  Follow up plan: Unsuccessful telephone outreach attempt made. A HIPAA compliant phone message was left for the patient providing contact information and requesting a return call.  The care management team will reach out to the patient again over the next 7 days.  If patient returns call to provider office, please advise to call [facility]  at [place], [place], [place] [facility]  [place], [place] [address] Direct Dial: [phone] [email or web address]: [email or web address]

## 2021-02-02 ENCOUNTER — Telehealth: Payer: Medicare HMO

## 2021-02-03 DIAGNOSIS — M542 Cervicalgia: Secondary | ICD-10-CM | POA: Diagnosis not present

## 2021-02-08 ENCOUNTER — Telehealth: Payer: Self-pay | Admitting: Licensed Clinical Social Worker

## 2021-02-08 ENCOUNTER — Telehealth: Payer: Medicare HMO

## 2021-02-08 NOTE — Telephone Encounter (Signed)
    Clinical Social Work  Chronic Care Management   Phone Outreach    02/08/2021 Name: Johaan Ryser MRN: [id number] DOB: [birth year]  Daquon Greenleaf is a 70 y.o. year old male who is a primary care patient of Serena Colonel, MD .   Reason for referral: [place] [facility].    F/U phone call today to assess needs, progress and barriers with care plan goals.   2nd unsuccessful telephone outreach attempt.  If unable to reach patient by phone on the 3rd attempt, will discontinue outreach calls but will be available at any time to provide services.   Plan:CCM LCSW will wait for return call. If no return call is received, Will reach out to patient again in the next 30 days .   Review of patient status, including review of consultants reports, relevant laboratory and other test results, and collaboration with appropriate care team members and the patient's provider was performed as part of comprehensive patient evaluation and provision of care management services.    Christa See, MSW, [name] Care Management [email or web address] Phone [phone] 5:36 PM

## 2021-02-14 ENCOUNTER — Telehealth: Payer: Medicare HMO

## 2021-02-17 DIAGNOSIS — M542 Cervicalgia: Secondary | ICD-10-CM | POA: Diagnosis not present

## 2021-02-24 ENCOUNTER — Telehealth: Payer: Self-pay

## 2021-02-24 ENCOUNTER — Telehealth: Payer: Medicare HMO

## 2021-02-24 NOTE — Telephone Encounter (Signed)
  Care Management   Follow Up Note   02/24/2021 Name: Luismario Coston MRN: [id number] DOB: 1951-02-18   Referred by: Serena Colonel, MD Reason for referral : Chronic Care Management (RNCM: Follow up for Chronic Disease Management and Care Coordination Needs- patient ask for a follow up call on Monday)   An unsuccessful telephone outreach was attempted today. The patient was referred to the case management team for assistance with care management and care coordination. The patient actually answered and ask for a call back on Monday as he was going to pick his daughter up and could not talk right now.   Follow Up Plan: Telephone follow up appointment with care management team member scheduled for: 02-28-2021   Noreene Larsson RN, MSN, [place] Mobile: [phone]

## 2021-02-28 ENCOUNTER — Telehealth: Payer: Medicare HMO | Admitting: General Practice

## 2021-02-28 ENCOUNTER — Ambulatory Visit (INDEPENDENT_AMBULATORY_CARE_PROVIDER_SITE_OTHER): Payer: Medicare HMO

## 2021-02-28 DIAGNOSIS — S134XXA Sprain of ligaments of cervical spine, initial encounter: Secondary | ICD-10-CM

## 2021-02-28 DIAGNOSIS — E890 Postprocedural hypothyroidism: Secondary | ICD-10-CM

## 2021-02-28 DIAGNOSIS — F419 Anxiety disorder, unspecified: Secondary | ICD-10-CM

## 2021-02-28 DIAGNOSIS — S199XXA Unspecified injury of neck, initial encounter: Secondary | ICD-10-CM

## 2021-02-28 DIAGNOSIS — G8929 Other chronic pain: Secondary | ICD-10-CM

## 2021-02-28 DIAGNOSIS — G20 Parkinson's disease: Secondary | ICD-10-CM

## 2021-02-28 NOTE — Chronic Care Management (AMB) (Signed)
Chronic Care Management   CCM RN Visit Note  02/28/2021 Name: Todd Green MRN: [id number] DOB: July 19, 1950  Subjective: Todd Green is a 70 y.o. year old male who is a primary care patient of Jearld Fenton, NP. The care management team was consulted for assistance with disease management and care coordination needs.    Engaged with patient by telephone for follow up visit in response to provider referral for case management and/or care coordination services.   Consent to Services:  The patient was given information about Chronic Care Management services, agreed to services, and gave verbal consent prior to initiation of services.  Please see initial visit note for detailed documentation.   Patient agreed to services and verbal consent obtained.   Assessment: Review of patient past medical history, allergies, medications, health status, including review of consultants reports, laboratory and other test data, was performed as part of comprehensive evaluation and provision of chronic care management services.   SDOH (Social Determinants of Health) assessments and interventions performed:  SDOH Interventions    Flowsheet Row Most Recent Value  SDOH Interventions   Housing Interventions Intervention Not Indicated  Intimate Partner Violence Interventions Intervention Not Indicated  Social Connections Interventions Intervention Not Indicated        CCM Care Plan  Allergies  Allergen Reactions   Allegra [Fexofenadine] Other (See Comments)    Redness in gums   Sulfa Antibiotics Rash    Outpatient Encounter Medications as of 02/28/2021  Medication Sig   carbidopa-levodopa (SINEMET IR) 25-100 MG tablet Take 1 tablet by mouth 3 (three) times daily.   cetirizine (ZYRTEC) 10 MG tablet Take 1 tablet (10 mg total) by mouth daily.   fluticasone (FLONASE) 50 MCG/ACT nasal spray USE 2 SPRAYS IN EACH NOSTRIL EVERY DAY   levofloxacin (LEVAQUIN) 500 MG tablet Take 1 tablet (500 mg total)  by mouth daily.   levothyroxine (SYNTHROID) 137 MCG tablet TAKE 1 TABLET (137 MCG TOTAL) BY MOUTH DAILY BEFORE BREAKFAST (NEED MD APPOINTMENT)   Multiple Vitamin (MULTI-VITAMIN) tablet Take 1 tablet by mouth daily. (Patient not taking: Reported on 01/10/2021)   omeprazole (PRILOSEC) 40 MG capsule TAKE 1 CAPSULE EVERY DAY   tadalafil (CIALIS) 5 MG tablet Take 5 mg by mouth daily.   Testosterone 20.25 MG/1.25GM (1.62%) GEL Apply 1 application topically daily.   No facility-administered encounter medications on file as of 02/28/2021.    Patient Active Problem List   Diagnosis Date Noted   Chronic neck pain 01/10/2021   Overweight with body mass index (BMI) of 25 to 25.9 in adult 11/09/2020   Parkinson disease ([place]) 06/08/2020   Benign essential tremor 10/31/2017   Kyphosis of cervical region 02/03/2017   Acquired hypothyroidism 02/02/2017   Acid reflux 02/02/2017    Conditions to be addressed/monitored:Anxiety, Hypothyroidism, and PD, neck pain and limited mobility  Care Plan : RNCM: Parkinson's Disease Management  Updates made by Vanita Ingles, RN since 02/28/2021 12:00 AM     Problem: RNCM: Parkinson's Disease   Priority: Medium     Long-Range Goal: RNCM: Parklinson's Disease Management   Start Date: 07/29/2020  Expected End Date: 08/18/2021  This Visit's Progress: On track  Recent Progress: On track  Priority: Medium  Note:   Current Barriers:  Knowledge Deficits related to resources and support for new diagnosis of PD Care Coordination needs related to PD support group in [name]  in a patient with new diagnosis of PD Chronic Disease Management support and education needs related to PD  Unable to independently manage PD Lacks social connections Does not contact provider office for questions/concerns  Nurse Case Manager Clinical Goal(s):  patient will verbalize understanding of plan for effective management of PD patient will work with [name] and pcp to address needs  related to PD patient will work with care guides  (community agency) to find PD support and education in the community  Interventions:  1:1 collaboration with Jearld Fenton, NP regarding development and update of comprehensive plan of care as evidenced by provider attestation and co-signature Inter-disciplinary care team collaboration (see longitudinal plan of care) Evaluation of current treatment plan related to Parkinson's Disease  and patient's adherence to plan as established by provider. 10-07-2020: The patient is happier since getting a second opinion on his PD. He has tremor predominant PD and his new neurologist spent and hour and a half working with him and explaining things. He feels much better and this is the mildest form of PD a person can have. The patient states that he is going to the gym every day and he is managing his PD well at this time. States he will not return to Dr. Brigitte Pulse. 12-13-2020: The patient states he is doing well and not having any issues with his PD at this time. Is taking the medications and it is effective with helping the tremor he is having. States he will return to see the neurologist in November. 02-28-2021: The patient states he is stable at this time. Denies any new concerns with PD. Advised patient to of the sx and sx of Parkinson's disease and advancement of the disease process. 10-07-2020: Review of the patients sx and sx. The patient states he is doing well and denies any acute distress.  Was eating his lunch and happy today. States he was not going to the gym today because it is too hot outside. 12-13-2020: The patient states he has been consistently going to the gym and he is doing well. Denies any new sx and sx of his PD. Feels better since getting a second opinion. Will continue to monitor. 02-28-2021: The patient continues to go to the gym and he is doing well. Is happy and wants to talk to the pcp about support with ED and he has taken Cialis before. Advised the  patient to call the office and set up an appointment to see pcp and discuss options. The patient states he will call the office tomorrow and get an appointment.  Provided education to patient re: diagnosis of PD and how to effectively manage with other chronic conditions. 12-13-2020: Since getting established with a new neurologist he is much happier and not as anxious about his PD. States he knows how to effectively manage his care.  Reviewed medications with patient and discussed compliance. 02-28-2021: Confirms compliance with his medications.  Provided patient with PD  educational materials related to overview and support of patients with PD Care Guide referral for resources in the area for PD support and ongoing learning  Discussed plans with patient for ongoing care management follow up and provided patient with direct contact information for care management team  Patient Goals/Self-Care Activities  patient will:  - Patient will self administer medications as prescribed Patient will attend all scheduled provider appointments Patient will call pharmacy for medication refills Patient will attend church or other social activities Patient will continue to perform ADL's independently Patient will continue to perform IADL's independently Patient will call provider office for new concerns or questions Patient will work with  BSW to address care coordination needs and will continue to work with the clinical team to address health care and disease management related needs.    - barriers to meeting goals identified - change-talk evoked - choices provided - collaboration with team encouraged - decision-making supported - health risks reviewed - problem-solving facilitated - questions answered - readiness for change evaluated - reassurance provided - resources needed to meet goals identified - self-reflection promoted - self-reliance encouraged - verbalization of feelings encouraged         Care Plan : RNCM: Neck pain and limited mobility due to traumatic injury  Updates made by Vanita Ingles, RN since 02/28/2021 12:00 AM     Problem: RNCM: Neck pain/back pain- limited mobility due to traumatic injury   Priority: High     Long-Range Goal: RNCM: Neck pain/limited mobility   Start Date: 07/29/2020  Expected End Date: 07/29/2021  This Visit's Progress: On track  Recent Progress: On track  Priority: High  Note:   Current Barriers:  Knowledge Deficits related to managing acute/chronic pain Non-adherence to scheduled provider appointments Non-adherence to prescribed medication regimen Difficulty obtaining medications Chronic Disease Management support and education needs related to chronic pain Unable to independently manage neck pain and mobility/back pain Lacks social connections Does not maintain contact with provider office Does not contact provider office for questions/concerns Nurse Case Manager Clinical Goal(s):  patient will verbalize understanding of plan for managing pain Patient will see specialist in May patient will demonstrate use of different relaxation  skills and/or diversional activities to assist with pain reduction (distraction, imagery, relaxation, massage, acupressure, TENS, heat, and cold application patient will report pain at a level less than 3 to 4 on a 10-10 rating scale patient will use pharmacological and nonpharmacological pain relief strategies patient will verbalize acceptable level of pain relief and ability to engage in desired activities patient will engage in desired activities without an increase in pain level Interventions:  Collaboration with Jearld Fenton, NP regarding development and update of comprehensive plan of care as evidenced by provider attestation and co-signature Inter-disciplinary care team collaboration (see longitudinal plan of care) - careful application of heat or ice encouraged - deep breathing, relaxation and  mindfulness use promoted - effectiveness of pharmacologic therapy monitored - medication-induced side effects managed - misuse of pain medication assessed - motivation and barriers to change assessed and addressed. 10-07-2020: The patient has had an MRI and saw Dr. Arbie Cookey.  The patient states that everything checked out good and he is feeling much better. Denies pain or discomfort today. States mobility is better. Will likely close this care plan at next outreach if the patient is stable and denies further issues with pain or discomfort. 02-28-2021: The patient feels he is doing well at managing his pain right now. Denies any acute distress at this time. Is going almost daily to work out at [employer]. Feels he is doing well at th is time. Will continue to monitor.  - mutually acceptable comfort goal set - pain assessed. 02-28-2021: Denies any acute pain today. States he is doing well - pain treatment goals reviewed - participation in physical therapy encouraged. 02-28-2021: Has been working with physical therapy. Feels that this has been helpful - participation in support group encouraged - premedication prior to activity encouraged Evaluation of current treatment plan related to neck pain/back pain and mobility  and patient's adherence to plan as established by provider. 12-13-2020: Has seen the provider. Denies any new issues at  this time. States he is stable. MRI revealed no new concerns.  Advised patient to call the office for changes in condition or worsening pain and discomfort  Provided education to patient re: working with specialist, pacing activity, alternate pain relief measures, how often cortisone injections can be done, safety and preventing further injury Reviewed medications with patient and discussed compliance Discussed plans with patient for ongoing care management follow up and provided patient with direct contact information for care management team Allow patient to maintain a diary of  pain ratings, timing, precipitating events, medications, treatments, and what works best to relieve pain,  Refer to support groups and self-help groups Educate patient about the use of pharmacological interventions for pain management- antianxiety, antidepressants, NSAIDS, opioid analgesics,  Explain the importance of lifestyle modifications to effective pain management  Patient Goals/Self Care Activities:  - active listening utilized - depression screen reviewed - fear of pain acknowledged - positive reinforcement provided - problem-solving facilitated - relaxation techniques promoted - self-reflection promoted Self-administers medications as prescribed Attends all scheduled provider appointments Calls pharmacy for medication refills Calls provider office for new concerns or questions    Care Plan : RNCM: Anxiety (Adult)  Updates made by Vanita Ingles, RN since 02/28/2021 12:00 AM     Problem: RNCM: Anxiety Identification (Anxiety)   Priority: Medium     Long-Range Goal: RNCM: Anxiety Symptoms Identified   Start Date: 07/29/2020  Expected End Date: 07/29/2021  This Visit's Progress: On track  Recent Progress: On track  Priority: Medium  Note:   Current Barriers:  Chronic Disease Management support and education needs related to anxiety  Lacks caregiver support.  Unable to independently anxiety  Lacks social connections Does not contact provider office for questions/concerns  Nurse Case Manager Clinical Goal(s):  patient will verbalize understanding of plan for effective management of anxiety  patient will work with RNCM/CCM team and pcp to address needs related to anxiety  patient will demonstrate a decrease in anxiety exacerbations as evidenced by management of chronic conditions   Interventions:  1:1 collaboration with Jearld Fenton, NP regarding development and update of comprehensive plan of care as evidenced by provider attestation and  co-signature Inter-disciplinary care team collaboration (see longitudinal plan of care) Evaluation of current treatment plan related to anxiety  and patient's adherence to plan as established by provider. 10-07-2020: The patient states his anxiety level is better as he has a definitive answer to his PD.  He is thankful he sought a second opinion.  The patient is doing much better and going to the gym daily. He feels like he has control of his chronic conditions. Will continue to monitor for changes. 12-13-2020: The patient is doing well with the management of his anxiety at this time. Denies any new concerns. Will continue to monitor for changes. 02-28-2021: The patient was in a good mood today and says he has "a girlfriend on every corner". He wants to know what to do to get some assistance with cialis. Encouraged the patient to call the office and get an appointment with the pcp to discuss options.  Advised patient to call the office for changes in mood/anxiety/ depressed state Provided education to patient re: anxiety management  Discussed plans with patient for ongoing care management follow up and provided patient with direct contact information for care management team  Patient Goals/Self-Care Activities patient will:  - Patient will self administer medications as prescribed Patient will attend all scheduled provider appointments Patient will call pharmacy  for medication refills Patient will attend church or other social activities Patient will continue to perform ADL's independently Patient will continue to perform IADL's independently Patient will call provider office for new concerns or questions Patient will work with BSW to address care coordination needs and will continue to work with the clinical team to address health care and disease management related needs.   - anxiety screen reviewed - depression screen reviewed - somatic and anxiety symptoms correlated  Follow Up Plan: Telephone  follow up appointment with care management team member scheduled for: 05-02-2021 at 230 pm       Care Plan : RNCM: Hypothyroidism  Updates made by Vanita Ingles, RN since 02/28/2021 12:00 AM     Problem: RNCM: Hypothyroidism   Priority: Medium     Long-Range Goal: RNCM: Hypothyroidsim   Start Date: 07/29/2020  Expected End Date: 07/29/2021  Recent Progress: On track  Priority: Medium  Note:   Current Barriers:  Chronic Disease Management support and education needs related to hypothyroidism  Lacks social connections Does not contact provider office for questions/concerns  Nurse Case Manager Clinical Goal(s):  patient will verbalize understanding of plan for effective management of hypothryoidism patient will work with RNCM and pcp  to address needs related to hypothyroidism  Interventions:  1:1 collaboration with Jearld Fenton, NP regarding development and update of comprehensive plan of care as evidenced by provider attestation and co-signature Inter-disciplinary care team collaboration (see longitudinal plan of care) Evaluation of current treatment plan related to hypothyroidism and patient's adherence to plan as established by provider. 02-28-2021: Review of compliance with medications and plan of care. The patient endorses compliance.  Advised patient to call for changes or questions Provided education to patient re: hypothyroidism and effective management  through emmi and my chart. 02-28-2021: The patient denies any new educational needs at this time.  Discussed plans with patient for ongoing care management follow up and provided patient with direct contact information for care management team  Patient Goals/Self-Care Activities patient will:  - Patient will self administer medications as prescribed Patient will attend all scheduled provider appointments Patient will call pharmacy for medication refills Patient will attend church or other social activities Patient will  continue to perform ADL's independently Patient will continue to perform IADL's independently Patient will call provider office for new concerns or questions Patient will work with BSW to address care coordination needs and will continue to work with the clinical team to address health care and disease management related needs.   - barriers to meeting goals identified - change-talk evoked - collaboration with team encouraged - decision-making supported - health risks reviewed - problem-solving facilitated - questions answered - readiness for change evaluated - reassurance provided - resources needed to meet goals identified - self-reflection promoted - self-reliance encouraged - verbalization of feelings encouraged  Follow Up Plan: Telephone follow up appointment with care management team member scheduled for: 05-02-2021 at 230pm         Plan:Telephone follow up appointment with care management team member scheduled for:  05-02-2021 at 230 pm  [place], MSN, [place] Mobile: [phone]

## 2021-02-28 NOTE — Patient Instructions (Signed)
Visit Information  Patient verbalizes understanding of instructions provided today and agrees to view in [place].   Telephone follow up appointment with care management team member scheduled for: 05-02-2021 at 230 pm  Noreene Larsson RN, MSN, [place] Mobile: [phone]

## 2021-03-08 ENCOUNTER — Ambulatory Visit: Payer: Medicare HMO | Admitting: Licensed Clinical Social Worker

## 2021-03-08 DIAGNOSIS — G8929 Other chronic pain: Secondary | ICD-10-CM

## 2021-03-08 DIAGNOSIS — E039 Hypothyroidism, unspecified: Secondary | ICD-10-CM

## 2021-03-08 DIAGNOSIS — G20A1 Parkinson's disease without dyskinesia, without mention of fluctuations: Secondary | ICD-10-CM

## 2021-03-08 DIAGNOSIS — G20 Parkinson's disease: Secondary | ICD-10-CM

## 2021-03-08 DIAGNOSIS — G250 Essential tremor: Secondary | ICD-10-CM

## 2021-03-09 NOTE — Patient Instructions (Signed)
Visit Information  Patient verbalizes understanding of instructions provided today and agrees to view in [place].   No further follow up required: Patient has completed care plan goals  Christa See, MSW, [email or web address] Phone [phone] 4:22 PM

## 2021-03-09 NOTE — Chronic Care Management (AMB) (Signed)
Chronic Care Management    Clinical Social Work Note  03/09/2021 Name: Todd Green MRN: [id number] DOB: [birth year]  Todd Green is a 70 y.o. year old male who is a primary care patient of Jearld Fenton, NP. The CCM team was consulted to assist the patient with chronic disease management and/or care coordination needs related to: Mental Health Counseling and Resources.   Engaged with patient by telephone for follow up visit in response to provider referral for social work chronic care management and care coordination services.   Consent to Services:  The patient was given information about Chronic Care Management services, agreed to services, and gave verbal consent prior to initiation of services.  Please see initial visit note for detailed documentation.   Patient agreed to services and consent obtained.   Consent to Services:  The patient was given information about Care Management services, agreed to services, and gave verbal consent prior to initiation of services.  Please see initial visit note for detailed documentation.   Patient agreed to services today and consent obtained.  Engaged with patient by phone in response to provider referral for social work care coordination services:  Assessment/Interventions: Assessed patient's current treatment, progress, coping skills, support system and barriers to care.  Patient has completed all goals with the management of anxiety regarding chronic health conditions .   Follow up Plan: All care plan goals have been met. CCM LCSW will disconnect from care team   SDOH (Social Determinants of Health) assessments and interventions performed:    Advanced Directives Status: Not addressed in this encounter.  CCM Care Plan  Allergies  Allergen Reactions   Allegra [Fexofenadine] Other (See Comments)    Redness in gums   Sulfa Antibiotics Rash    Outpatient Encounter Medications as of 03/08/2021  Medication Sig   carbidopa-levodopa  (SINEMET IR) 25-100 MG tablet Take 1 tablet by mouth 3 (three) times daily.   cetirizine (ZYRTEC) 10 MG tablet Take 1 tablet (10 mg total) by mouth daily.   fluticasone (FLONASE) 50 MCG/ACT nasal spray USE 2 SPRAYS IN EACH NOSTRIL EVERY DAY   levofloxacin (LEVAQUIN) 500 MG tablet Take 1 tablet (500 mg total) by mouth daily.   levothyroxine (SYNTHROID) 137 MCG tablet TAKE 1 TABLET (137 MCG TOTAL) BY MOUTH DAILY BEFORE BREAKFAST (NEED MD APPOINTMENT)   Multiple Vitamin (MULTI-VITAMIN) tablet Take 1 tablet by mouth daily. (Patient not taking: Reported on 01/10/2021)   omeprazole (PRILOSEC) 40 MG capsule TAKE 1 CAPSULE EVERY DAY   tadalafil (CIALIS) 5 MG tablet Take 5 mg by mouth daily.   Testosterone 20.25 MG/1.25GM (1.62%) GEL Apply 1 application topically daily.   No facility-administered encounter medications on file as of 03/08/2021.    Patient Active Problem List   Diagnosis Date Noted   Chronic neck pain 01/10/2021   Overweight with body mass index (BMI) of 25 to 25.9 in adult 11/09/2020   Parkinson disease ([place]) 06/08/2020   Benign essential tremor 10/31/2017   Kyphosis of cervical region 02/03/2017   Acquired hypothyroidism 02/02/2017   Acid reflux 02/02/2017    Conditions to be addressed/monitored:  Chronic neck pain, Parkinson disease, Kyphosis of cervical region  Care Plan : General Social Work (Adult)  Updates made by Christa See D, LCSW since 03/09/2021 12:00 AM     Problem: Anxiety      Long-Range Goal: Management of Anxiety Symptoms Completed 03/08/2021  Start Date: 06/22/2020  This Visit's Progress: On track  Recent Progress: On track  Priority: Medium  Note:   Current barriers:   Chronic Mental Health needs related to Anxiety Needs Support, Education, and Care Coordination in order to meet unmet mental health needs Clinical Goal(s): Over the next 60 days, patient will work with SW to reduce or manage symptoms of anxiety and stress and increase knowledge and/or  ability of: coping skills and stress reduction Clinical Interventions:  Assessed patient's previous treatment, needs, coping skills, current treatment, support system and barriers to care   brief mental health assessment;Suicidal Ideation/Homicidal Ideation: No  Patient continues to endorse a decrease in anxiety symptoms Patient reports that he has not been contacted to address safety rails for outside home to promote safety. Per chart review, patient was contacted and provided resources; however, patient denies this occurring. CCM LCSW will complete new referral to Care Guide  Pt continues to visit the gym regularly to manage stress and promote health/well-being. He has a strong support system that consists of adult daughter and girlfriend Patient has an upcoming appt with movement disorder specialist Friday, 03/11/21 Other interventions: Active listening / Reflection utilized , Emotional Supportive Provided, and Problem Celebration  ; Collaboration with PCP regarding development and update of comprehensive plan of care as evidenced by provider attestation and co-signature Inter-disciplinary care team collaboration (see longitudinal plan of care) Patient Goals/Self-Care Activities: Over the next 60 days Attend upcoming appointments Continue to exercise at least 2 to 3 times per week Practice positive thinking and self-talk       Christa See, MSW, [email or web address] Phone [phone] 4:16 PM

## 2021-03-11 DIAGNOSIS — G20 Parkinson's disease: Secondary | ICD-10-CM | POA: Diagnosis not present

## 2021-03-23 DIAGNOSIS — E890 Postprocedural hypothyroidism: Secondary | ICD-10-CM

## 2021-03-23 DIAGNOSIS — E039 Hypothyroidism, unspecified: Secondary | ICD-10-CM

## 2021-03-24 DIAGNOSIS — M542 Cervicalgia: Secondary | ICD-10-CM | POA: Diagnosis not present

## 2021-03-31 ENCOUNTER — Ambulatory Visit: Payer: Self-pay | Admitting: *Deleted

## 2021-03-31 DIAGNOSIS — M542 Cervicalgia: Secondary | ICD-10-CM | POA: Diagnosis not present

## 2021-03-31 NOTE — Telephone Encounter (Signed)
Reason for Disposition  [1] MILD diarrhea (e.g., 1-3 or more stools than normal in past 24 hours) without known cause AND [2] present >  7 days  Answer Assessment - Initial Assessment Questions 1. DIARRHEA SEVERITY: "How bad is the diarrhea?" "How many more stools have you had in the past 24 hours than normal?"    - NO DIARRHEA (SCALE 0)   - MILD (SCALE 1-3): Few loose or mushy BMs; increase of 1-3 stools over normal daily number of stools; mild increase in ostomy output.   -  MODERATE (SCALE 4-7): Increase of 4-6 stools daily over normal; moderate increase in ostomy output. * SEVERE (SCALE 8-10; OR 'WORST POSSIBLE'): Increase of 7 or more stools daily over normal; moderate increase in ostomy output; incontinence.     Mild 3 x day at time  and incont. 2. ONSET: "When did the diarrhea begin?"      1 week ago after eating mushrooms and hamburger  3. BM CONSISTENCY: "How loose or watery is the diarrhea?"      Liquid , clear  4. VOMITING: "Are you also vomiting?" If Yes, ask: "How many times in the past 24 hours?"      no 5. ABDOMINAL PAIN: "Are you having any abdominal pain?" If Yes, ask: "What does it feel like?" (e.g., crampy, dull, intermittent, constant)      No some bloating and gas 6. ABDOMINAL PAIN SEVERITY: If present, ask: "How bad is the pain?"  (e.g., Scale 1-10; mild, moderate, or severe)   - MILD (1-3): doesn't interfere with normal activities, abdomen soft and not tender to touch    - MODERATE (4-7): interferes with normal activities or awakens from sleep, abdomen tender to touch    - SEVERE (8-10): excruciating pain, doubled over, unable to do any normal activities       Denies pain 7. ORAL INTAKE: If vomiting, "Have you been able to drink liquids?" "How much liquids have you had in the past 24 hours?"     Yes 8. HYDRATION: "Any signs of dehydration?" (e.g., dry mouth [not just dry lips], too weak to stand, dizziness, new weight loss) "When did you last urinate?"     Denies  9.  EXPOSURE: "Have you traveled to a foreign country recently?" "Have you been exposed to anyone with diarrhea?" "Could you have eaten any food that was spoiled?"     na 10. ANTIBIOTIC USE: "Are you taking antibiotics now or have you taken antibiotics in the past 2 months?"       Na  11. OTHER SYMPTOMS: "Do you have any other symptoms?" (e.g., fever, blood in stool)       Incontinence and bloating and gas  12. PREGNANCY: "Is there any chance you are pregnant?" "When was your last menstrual period?"       na  Protocols used: [facility]

## 2021-03-31 NOTE — Telephone Encounter (Signed)
C/o clear liquid stools approx. 3 x day for 1 week after eating mushrooms and hamburger. C/o gas, incontinence of stool. Requesting medication to get rid of diarrhea. Has not taken OTC imodium or pepto-bismol. Denies blood in stool, abd pain. Can eat and drink without difficulty. Requesting medication prior to weekend. Requesting a call back from PCP regarding medication and recommendations. VV My chart appt scheduled for 04/04/21. Care advise given. Patient verbalized understanding of care advise and to call back if symptoms worsen.

## 2021-04-01 NOTE — Telephone Encounter (Signed)
The pt was notified of Todd Green recommendation. He verbalize understanding, no questions or concern.

## 2021-04-01 NOTE — Telephone Encounter (Signed)
Recommend Imodium 2 mg, no more than 3 tabs in 24 hours as this can cause constipation. BRAT diet.

## 2021-04-04 ENCOUNTER — Telehealth (INDEPENDENT_AMBULATORY_CARE_PROVIDER_SITE_OTHER): Payer: Medicare HMO | Admitting: Internal Medicine

## 2021-04-04 ENCOUNTER — Encounter: Payer: Self-pay | Admitting: Internal Medicine

## 2021-04-04 ENCOUNTER — Other Ambulatory Visit: Payer: Self-pay

## 2021-04-04 VITALS — BP 113/74 | HR 85 | Temp 97.7°F | Resp 18 | Ht 69.5 in | Wt 170.4 lb

## 2021-04-04 DIAGNOSIS — R197 Diarrhea, unspecified: Secondary | ICD-10-CM
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
## 2021-04-04 NOTE — Patient Instructions (Signed)
Bland Diet A bland diet consists of foods that are often soft and do not have a lot of fat, fiber, or extra seasonings. Foods without fat, fiber, or seasoning are easier for the body to digest. They are also less likely to irritate your mouth, throat, stomach, and other parts of your digestive system. A bland diet is sometimes called a BRAT diet. What is my plan? Your health care provider or food and nutrition specialist (dietitian) may recommend specific changes to your diet to prevent symptoms or to treat your symptoms. These changes may include: Eating small meals often. Cooking food until it is soft enough to chew easily. Chewing your food well. Drinking fluids slowly. Not eating foods that are very spicy, sour, or fatty. Not eating citrus fruits, such as oranges and grapefruit. What do I need to know about this diet? Eat a variety of foods from the bland diet food list. Do not follow a bland diet longer than needed. Ask your health care provider whether you should take vitamins or supplements. What foods can I eat? Grains Hot cereals, such as cream of wheat. Rice. Bread, crackers, or tortillas made from refined white flour. Vegetables Canned or cooked vegetables. Mashed or boiled potatoes. Fruits Bananas. Applesauce. Other types of cooked or canned fruit with the skin and seeds removed, such as canned peaches or pears. Meats and other proteins Scrambled eggs. Creamy peanut butter or other nut butters. Lean, well-cooked meats, such as chicken or fish. Tofu. Soups or broths. Dairy Low-fat dairy products, such as milk, cottage cheese, or yogurt. Beverages Water. Herbal tea. Apple juice. Fats and oils Mild salad dressings. Canola or olive oil. Sweets and desserts Pudding. Custard. Fruit gelatin. Ice cream. The items listed above may not be a complete list of recommended foods and beverages. Contact a dietitian for more options. What foods are not recommended? Grains Whole grain  breads and cereals. Vegetables Raw vegetables. Fruits Raw fruits, especially citrus, berries, or dried fruits. Dairy Whole fat dairy foods. Beverages Caffeinated drinks. Alcohol. Seasonings and condiments Strongly flavored seasonings or condiments. Hot sauce. Salsa. Other foods Spicy foods. Fried foods. Sour foods, such as pickled or fermented foods. Foods with high sugar content. Foods high in fiber. The items listed above may not be a complete list of foods and beverages to avoid. Contact a dietitian for more information. Summary A bland diet consists of foods that are often soft and do not have a lot of fat, fiber, or extra seasonings. Foods without fat, fiber, or seasoning are easier for the body to digest. Check with your health care provider to see how long you should follow this diet plan. It is not meant to be followed for long periods. This information is not intended to replace advice given to you by your health care provider. Make sure you discuss any questions you have with your health care provider. Document Revised: 05/09/2017 Document Reviewed: 05/09/2017 Elsevier Patient Education  2022 [employer].

## 2021-04-05 ENCOUNTER — Telehealth: Payer: Self-pay | Admitting: Internal Medicine

## 2021-04-05 ENCOUNTER — Ambulatory Visit: Payer: Self-pay | Admitting: *Deleted

## 2021-04-05 LAB — COMPLETE METABOLIC PANEL WITH GFR
AG Ratio: 1.4 (calc) (ref 1.0–2.5)
ALT: 11 U/L (ref 9–46)
AST: 13 U/L (ref 10–35)
Albumin: 3.7 g/dL (ref 3.6–5.1)
Alkaline phosphatase (APISO): 81 U/L (ref 35–144)
BUN: 21 mg/dL (ref 7–25)
CO2: 27 mmol/L (ref 20–32)
Calcium: 8.5 mg/dL — ABNORMAL LOW (ref 8.6–10.3)
Chloride: 108 mmol/L (ref 98–110)
Creat: 0.95 mg/dL (ref 0.70–1.28)
Globulin: 2.6 g/dL (calc) (ref 1.9–3.7)
Glucose, Bld: 85 mg/dL (ref 65–139)
Potassium: 4.3 mmol/L (ref 3.5–5.3)
Sodium: 142 mmol/L (ref 135–146)
Total Bilirubin: 0.5 mg/dL (ref 0.2–1.2)
Total Protein: 6.3 g/dL (ref 6.1–8.1)
eGFR: 85 mL/min/{1.73_m2} (ref >=60)

## 2021-04-05 LAB — CBC
HCT: 42.7 % (ref 38.5–50.0)
Hemoglobin: 13.7 g/dL (ref 13.2–17.1)
MCH: 29.6 pg (ref 27.0–33.0)
MCHC: 32.1 g/dL (ref 32.0–36.0)
MCV: 92.2 fL (ref 80.0–100.0)
MPV: 9.3 fL (ref 7.5–12.5)
Platelets: 356 10*3/uL (ref 140–400)
RBC: 4.63 10*6/uL (ref 4.20–5.80)
RDW: 13.7 % (ref 11.0–15.0)
WBC: 7 10*3/uL (ref 3.8–10.8)

## 2021-04-05 NOTE — Telephone Encounter (Signed)
Patient inquiring about stool sample and lab results and would like a follow up call best # [phone]

## 2021-04-05 NOTE — Telephone Encounter (Signed)
Pt given lab results per notes of R. Garnette Gunner, NP on 04/05/21. Pt verbalized understanding of normal labs and to continue  with waiting for results from stool sample. Reinforced to patient PCP will notify when results noted. C/o he is still experiencing abdominal bloating and loose BMs x 4 today , bu not as bad. Took kaopectate today . Patient verbalized understanding to call back if symptoms worsen.

## 2021-04-05 NOTE — Telephone Encounter (Signed)
Pt called back regarding results, please advise.

## 2021-04-05 NOTE — Telephone Encounter (Signed)
Patient has called back- he is frustrated he has not had call back regarding his stool test results- he complains of bloating and tells agent he is going to have to go to ED. Patient line was open at transfer of call- but patient was not on the line. Waited several minutes for patient to come back to the line- but he never responded.

## 2021-04-06 ENCOUNTER — Telehealth: Payer: Self-pay

## 2021-04-06 NOTE — Telephone Encounter (Signed)
Pt. Calling again for stool sample results. Have not resulted yet. States "I do feel a little better today." Instructed pt. His PCP will notify him as soon as she receives results.

## 2021-04-06 NOTE — Telephone Encounter (Signed)
Attempted to contact the patient, unable to reach him. If the patient calls back please inform him that his blood counts normal. Liver and kidney function are normal. Waiting results from stool sample.

## 2021-04-06 NOTE — Telephone Encounter (Signed)
Pt given results per notes of Rollene Fare, NP on 04/05/21. Pt is asking when stool results will be back. Advised him that those are sent out of office and typically take a bit longer than normal blood results. Advised him we will call when we receive results. Pt verbalized understanding.

## 2021-04-06 NOTE — Telephone Encounter (Signed)
Copied from [place] [id number]. Topic: General - Other >> Apr 05, 2021  8:58 AM Leward Quan A wrote: Reason for CRM: Patient called in to inform Webb Silversmith that he would like a call back as soon as his results are back on labs and stool test performed 04/04/21 can be reached at Ph# [phone]

## 2021-04-07 NOTE — Telephone Encounter (Signed)
Pt. Calling again for stool results. States he is still having diarrhea. "It's like clear water. Yesterday I had 4 episodes. I need some medicine to stop this." Taking Kaopectate without relief. Pt. Is upset stool results are not back. Instructed him these can take several days.Please advise pt.

## 2021-04-07 NOTE — Telephone Encounter (Signed)
Pt called about stool results/ pt stated since results are not back yet can he get an Rx called in to [facility] in [place] for the diarrhea asap today/ please advise / pt stated he doesn't want to suffer while waiting on results

## 2021-04-07 NOTE — Telephone Encounter (Signed)
I called the patient again and informed him that hs stool culture have not resulted yet, as soon as we get the result that we will give him a call. The patient verbalize understanding, no questions or concerns.

## 2021-04-11 LAB — GASTROINTESTINAL PATHOGEN PANEL PCR

## 2021-04-12 ENCOUNTER — Other Ambulatory Visit: Payer: Self-pay | Admitting: Internal Medicine

## 2021-04-12 DIAGNOSIS — R197 Diarrhea, unspecified: Secondary | ICD-10-CM

## 2021-04-18 LAB — GASTROINTESTINAL PATHOGEN PANEL PCR

## 2021-04-19 ENCOUNTER — Emergency Department
Admission: EM | Admit: 2021-04-19 | Discharge: 2021-04-19 | Disposition: A | Discharge status: Home / Self Care | Payer: Medicare HMO | Attending: Emergency Medicine | Admitting: Emergency Medicine

## 2021-04-19 ENCOUNTER — Ambulatory Visit: Payer: Self-pay

## 2021-04-19 ENCOUNTER — Other Ambulatory Visit: Payer: Self-pay

## 2021-04-19 ENCOUNTER — Telehealth: Payer: Self-pay

## 2021-04-19 DIAGNOSIS — E039 Hypothyroidism, unspecified: Secondary | ICD-10-CM | POA: Diagnosis not present

## 2021-04-19 DIAGNOSIS — R197 Diarrhea, unspecified: Secondary | ICD-10-CM | POA: Diagnosis not present

## 2021-04-19 DIAGNOSIS — Z79899 Other long term (current) drug therapy: Secondary | ICD-10-CM | POA: Insufficient documentation

## 2021-04-19 LAB — GASTROINTESTINAL PANEL BY PCR, STOOL (REPLACES STOOL CULTURE)

## 2021-04-19 LAB — COMPREHENSIVE METABOLIC PANEL
ALT: <5 U/L (ref 0–44)
AST: 15 U/L (ref 15–41)
Albumin: 3.6 g/dL (ref 3.5–5.0)
Alkaline Phosphatase: 73 U/L (ref 38–126)
Anion gap: 4 — ABNORMAL LOW (ref 5–15)
BUN: 23 mg/dL (ref 8–23)
CO2: 27 mmol/L (ref 22–32)
Calcium: 8.8 mg/dL — ABNORMAL LOW (ref 8.9–10.3)
Chloride: 109 mmol/L (ref 98–111)
Creatinine, Ser: 0.85 mg/dL (ref 0.61–1.24)
GFR, Estimated: >60 mL/min (ref >60)
Glucose, Bld: 124 mg/dL — ABNORMAL HIGH (ref 70–99)
Potassium: 3.8 mmol/L (ref 3.5–5.1)
Sodium: 140 mmol/L (ref 135–145)
Total Bilirubin: 0.7 mg/dL (ref 0.3–1.2)
Total Protein: 6.9 g/dL (ref 6.5–8.1)

## 2021-04-19 LAB — CBC
HCT: 42.3 % (ref 39.0–52.0)
Hemoglobin: 13.2 g/dL (ref 13.0–17.0)
MCH: 29.6 pg (ref 26.0–34.0)
MCHC: 31.2 g/dL (ref 30.0–36.0)
MCV: 94.8 fL (ref 80.0–100.0)
Platelets: 320 10*3/uL (ref 150–400)
RBC: 4.46 MIL/uL (ref 4.22–5.81)
RDW: 15 % (ref 11.5–15.5)
WBC: 9 10*3/uL (ref 4.0–10.5)
nRBC: 0 % (ref 0.0–0.2)

## 2021-04-19 LAB — LIPASE, BLOOD: Lipase: 29 U/L (ref 11–51)

## 2021-04-19 MED ORDER — TINIDAZOLE 500 MG PO TABS: 2.0000 g | ORAL_TABLET | Freq: Once | ORAL | 0 refills | Status: AC

## 2021-04-19 NOTE — Discharge Instructions (Addendum)
We are giving you tinidazole to treat a possible parasitic infection.  This is a one-time dose.  We have sent off another stool sample which your primary care provider can follow-up on.  Return to the ER for new, worsening, or persistent severe diarrhea, blood in the stool, abdominal pain, fever, vomiting, or any other new or worsening symptoms that concern you.
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
## 2021-04-19 NOTE — ED Triage Notes (Signed)
Pt reports diarrhea for a month. Denies abd pain. States GI is not able to fit him in  Dx with parkinsons

## 2021-04-19 NOTE — Telephone Encounter (Signed)
Called pt and pt is at ER now.
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
## 2021-04-19 NOTE — ED Provider Notes (Signed)
Emergency Medicine Provider Triage Evaluation Note  Todd Green , a 70 y.o. male  was evaluated in triage.  Pt complains of diarrhea for the past month. No abdominal pain. Family history of colon cancer. Colonoscopy 3-4 years ago.   Review of Systems  Positive: Diarrhea Negative: Fever, abdominal pain  Physical Exam  There were no vitals taken for this visit. Gen:   Awake, no distress   Resp:  Normal effort  MSK:   Moves extremities without difficulty  Other:    Medical Decision Making  Medically screening exam initiated at 11:27 AM.  Appropriate orders placed.  [name] was informed that the remainder of the evaluation will be completed by another provider, this initial triage assessment does not replace that evaluation, and the importance of remaining in the ED until their evaluation is complete.    Victorino Dike, FNP 04/19/21 1519    Vladimir Crofts, MD 04/20/21 [phone]

## 2021-04-22 NOTE — Telephone Encounter (Signed)
Spoke to patient called him 3 times. His phone kept hanging up. Pt wanted to know his results from his hospital visit on 12/27. Pt stool test came back with nothing detected. Pt needs to make an appt. So a referral can be placed.  KP

## 2021-04-22 NOTE — Telephone Encounter (Signed)
Pt called requesting to speak to a nurse, has questions.   Best contact: [phone]

## 2021-04-24 ENCOUNTER — Emergency Department
Admission: EM | Admit: 2021-04-24 | Discharge: 2021-04-24 | Disposition: A | Discharge status: Home / Self Care | Payer: Medicare HMO | Attending: Emergency Medicine | Admitting: Emergency Medicine

## 2021-04-24 ENCOUNTER — Encounter: Payer: Self-pay | Admitting: Emergency Medicine

## 2021-04-24 DIAGNOSIS — G20 Parkinson's disease: Secondary | ICD-10-CM | POA: Diagnosis not present

## 2021-04-24 DIAGNOSIS — R197 Diarrhea, unspecified: Secondary | ICD-10-CM | POA: Insufficient documentation

## 2021-04-24 MED ORDER — LOPERAMIDE HCL 2 MG PO TABS: 2.0000 mg | ORAL_TABLET | Freq: Four times a day (QID) | ORAL | 0 refills | Status: DC | PRN

## 2021-04-24 MED ORDER — LOPERAMIDE HCL 2 MG PO CAPS
2.0000 mg | ORAL_CAPSULE | Freq: Once | ORAL | Status: AC
  Administered 2021-04-24: 2 mg via ORAL
  Filled 2021-04-24: qty 1

## 2021-04-24 NOTE — ED Triage Notes (Signed)
Pt via POV from home. Pt c/o diarrhea for about a month. Denies abd pain. Pt was seen on 12/27 and states he has not been getting any better. Denies nausea. Pt is A&OX4 and NAD
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
## 2021-04-26 LAB — O&P RESULT

## 2021-04-26 LAB — OVA + PARASITE EXAM

## 2021-04-27 ENCOUNTER — Ambulatory Visit (INDEPENDENT_AMBULATORY_CARE_PROVIDER_SITE_OTHER): Payer: Medicare HMO | Admitting: Internal Medicine

## 2021-04-27 ENCOUNTER — Other Ambulatory Visit: Payer: Self-pay

## 2021-04-27 ENCOUNTER — Encounter: Payer: Self-pay | Admitting: Internal Medicine

## 2021-04-27 VITALS — BP 116/75 | HR 76 | Temp 97.7°F | Resp 17 | Ht 69.0 in | Wt 170.6 lb

## 2021-04-27 DIAGNOSIS — R197 Diarrhea, unspecified: Secondary | ICD-10-CM | POA: Diagnosis not present

## 2021-04-27 DIAGNOSIS — H524 Presbyopia: Secondary | ICD-10-CM | POA: Diagnosis not present
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
## 2021-04-27 NOTE — Patient Instructions (Signed)
Diarrhea, Adult °Diarrhea is when you pass loose and watery poop (stool) often. Diarrhea can make you feel weak and cause you to lose water in your body (get dehydrated). Losing water in your body can cause you to: °Feel tired and thirsty. °Have a dry mouth. °Go pee (urinate) less often. °Diarrhea often lasts 2-3 days. However, it can last longer if it is a sign of something more serious. It is important to treat your diarrhea as told by your doctor. °Follow these instructions at home: °Eating and drinking °  °Follow these instructions as told by your doctor: °Take an ORS (oral rehydration solution). This is a drink that helps you replace fluids and minerals your body lost. It is sold at pharmacies and stores. °Drink plenty of fluids, such as: °Water. °Ice chips. °Diluted fruit juice. °Low-calorie sports drinks. °Milk, if you want. °Avoid drinking fluids that have a lot of sugar or caffeine in them. °Eat bland, easy-to-digest foods in small amounts as you are able. These foods include: °Bananas. °Applesauce. °Rice. °Low-fat (lean) meats. °Toast. °Crackers. °Avoid alcohol. °Avoid spicy or fatty foods. ° °Medicines °Take over-the-counter and prescription medicines only as told by your doctor. °If you were prescribed an antibiotic medicine, take it as told by your doctor. Do not stop using the antibiotic even if you start to feel better. °General instructions ° °Wash your hands often using soap and water. If soap and water are not available, use a hand sanitizer. Others in your home should wash their hands as well. Hands should be washed: °After using the toilet or changing a diaper. °Before preparing, cooking, or serving food. °While caring for a sick person. °While visiting someone in a hospital. °Drink enough fluid to keep your pee (urine) pale yellow. °Rest at home while you get better. °Take a warm bath to help with any burning or pain from having diarrhea. °Watch your condition for any changes. °Keep all  follow-up visits as told by your doctor. This is important. °Contact a doctor if: °You have a fever. °Your diarrhea gets worse. °You have new symptoms. °You cannot keep fluids down. °You feel light-headed or dizzy. °You have a headache. °You have muscle cramps. °Get help right away if: °You have chest pain. °You feel very weak or you pass out (faint). °You have bloody or black poop or poop that looks like tar. °You have very bad pain, cramping, or bloating in your belly (abdomen). °You have trouble breathing or you are breathing very quickly. °Your heart is beating very quickly. °Your skin feels cold and clammy. °You feel confused. °You have signs of losing too much water in your body, such as: °Dark pee, very little pee, or no pee. °Cracked lips. °Dry mouth. °Sunken eyes. °Sleepiness. °Weakness. °Summary °Diarrhea is when you pass loose and watery poop (stool) often. °Diarrhea can make you feel weak and cause you to lose water in your body (get dehydrated). °Take an ORS (oral rehydration solution). This is a drink that is sold at pharmacies and stores. °Eat bland, easy-to-digest foods in small amounts as you are able. °Contact a doctor if your condition gets worse. Get help right away if you have signs that you have lost too much water in your body. °This information is not intended to replace advice given to you by your health care provider. Make sure you discuss any questions you have with your health care provider. °Document Revised: 10/20/2020 Document Reviewed: 10/20/2020 °Elsevier Patient Education © 2022 [employer] ° °

## 2021-04-28 DIAGNOSIS — M542 Cervicalgia: Secondary | ICD-10-CM | POA: Diagnosis not present

## 2021-05-02 ENCOUNTER — Other Ambulatory Visit: Payer: Self-pay | Admitting: Internal Medicine

## 2021-05-02 ENCOUNTER — Telehealth: Payer: Medicare HMO

## 2021-05-02 ENCOUNTER — Ambulatory Visit (INDEPENDENT_AMBULATORY_CARE_PROVIDER_SITE_OTHER): Payer: Medicare HMO

## 2021-05-02 DIAGNOSIS — G8929 Other chronic pain: Secondary | ICD-10-CM

## 2021-05-02 DIAGNOSIS — F419 Anxiety disorder, unspecified: Secondary | ICD-10-CM

## 2021-05-02 DIAGNOSIS — G20 Parkinson's disease: Secondary | ICD-10-CM

## 2021-05-02 DIAGNOSIS — E039 Hypothyroidism, unspecified: Secondary | ICD-10-CM

## 2021-05-02 DIAGNOSIS — R197 Diarrhea, unspecified: Secondary | ICD-10-CM

## 2021-05-02 DIAGNOSIS — M542 Cervicalgia: Secondary | ICD-10-CM

## 2021-05-02 DIAGNOSIS — S199XXA Unspecified injury of neck, initial encounter: Secondary | ICD-10-CM

## 2021-05-02 DIAGNOSIS — G250 Essential tremor: Secondary | ICD-10-CM

## 2021-05-02 NOTE — Patient Instructions (Signed)
Visit Information  Thank you for taking time to visit with me today. Please don't hesitate to contact me if I can be of assistance to you before our next scheduled telephone appointment.  Following are the goals we discussed today:  RNCM Clinical Goal(s):  Patient will verbalize basic understanding of Anxiety, Hypothyroidism, and PD, neck pain and limited mobility disease process and self health management plan as evidenced by stable conditions, calling the office for questions and concerns, and working with the CCM team to optimize health and well being take all medications exactly as prescribed and will call provider for medication related questions as evidenced by compliance with medication and calling for refills before running out of medications     attend all scheduled medical appointments: saw pcp on 04-27-2021. GI specialist on 06-08-2021 as evidenced by keeping appointments and calling the office for schedule changes needs        demonstrate improved and ongoing health management independence as evidenced by stable conditions, stable VS, no exacerbations of PD, neck pain or hypothyroidism, stable mental health        demonstrate ongoing self health care management ability for effective management of chronic conditions  as evidenced by working with the CCM team through collaboration with Consulting civil engineer, provider, and care team.    Interventions: 1:1 collaboration with primary care provider regarding development and update of comprehensive plan of care as evidenced by provider attestation and co-signature Inter-disciplinary care team collaboration (see longitudinal plan of care) Evaluation of current treatment plan related to  self management and patient's adherence to plan as established by provider     Anxiety  (Status: Goal on Track (progressing): YES.) Long Term Goal  Evaluation of current treatment plan related to Anxiety, Mental Health Concerns  self-management and patient's adherence  to plan as established by provider. Discussed plans with patient for ongoing care management follow up and provided patient with direct contact information for care management team Advised patient to call the office for changes in mood, anxiety, or depression ; Provided education to patient re: working with the CCM team to effectively manage anxiety and mental health concerns; Reviewed medications with patient and discussed compliance ; Discussed plans with patient for ongoing care management follow up and provided patient with direct contact information for care management team; Advised patient to discuss questions or concerns about mental health and anxiety with provider; Screening for signs and symptoms of depression related to chronic disease state;  Assessed social determinant of health barriers;       Acute onset of Diarrhea   (Status: Goal on Track (progressing): YES.) Short Term Goal  Evaluation of current treatment plan related to  diarrhea ,  self-management and patient's adherence to plan as established by provider. 05-02-2021: The patient states that the diarrhea has resolved now but he is still keeping his appointment with the GI specialist in February. The patient feels like he picked up a bacteria from an all male whirlpool that he used back in December. He thought about it and feels certain this is what happened. He saw the pcp once in December with an ER visit on 04-19-2021 and 04-24-2021 and a recent visit to the pcp on 04-27-2021.  Discussed plans with patient for ongoing care management follow up and provided patient with direct contact information for care management team Advised patient to call the office for new onset of diarrhea, to keep upcoming appointment with GI provider and to be safe in public access areas such  as whirlpools and other areas where bacteria can be transmitted. ; Provided education to patient re: hygiene, monitoring for sx and sx of infection and monitoring for  changes in bowel habits; Reviewed scheduled/upcoming provider appointments including 06-08-2021 at 230 pm; Discussed plans with patient for ongoing care management follow up and provided patient with direct contact information for care management team;    Hypothyroidism   (Status: Goal on Track (progressing): YES.) Long Term Goal  Evaluation of current treatment plan related to Hypothyroidism,  self-management and patient's adherence to plan as established by provider. Discussed plans with patient for ongoing care management follow up and provided patient with direct contact information for care management team Advised patient to call the office for changes in his hypothyroidism, questions or concerns; Reviewed medications with patient and discussed compliance ; Discussed plans with patient for ongoing care management follow up and provided patient with direct contact information for care management team; Advised patient to discuss new concerns about his thyroid health with provider;      Neck Pain with limited Mobility  (Status: Goal on Track (progressing): YES.) Long Term Goal  Pain assessment performed. 05-02-2021: the patient denies any pain today but states that the physical therapy has really helped. The patient needs a new referral sent to [facility] for PT to continue. In basket message sent to the pcp and CMA.  Medications reviewed. 05-02-2021: States compliance with medications  Reviewed provider established plan for pain management; Discussed importance of adherence to all scheduled medical appointments; Counseled on the importance of reporting any/all new or changed pain symptoms or management strategies to pain management provider; Advised patient to report to care team affect of pain on daily activities; Discussed use of relaxation techniques and/or diversional activities to assist with pain reduction (distraction, imagery, relaxation, massage, acupressure, TENS, heat, and cold  application; Reviewed with patient prescribed pharmacological and nonpharmacological pain relief strategies; Advised patient to discuss unresolved pain, changes in level or intensity of pain  with provider; 05-02-2021: The patient states that he has changes insurance provider and where he was getting physical therapy his new insurance will not cover that provider. He has an appointment with Waldemar Dickens on 05-22-2021 and needs a referral/authorization sent to [facility] from the pcp before services can begin. RNCM called [facility] and obtained the fax number. The number is [phone]. In basket message sent to pcp and CMA asking for assistance with referral to [place]. The pcp has placed a referral to [facility] and the patient was called back and notified of referral being placed. Denies any other concerns at this time. Will continue to monitor.    PD  (Status: Goal on Track (progressing): YES.) Long Term Goal  Evaluation of current treatment plan related to  PD ,  self-management and patient's adherence to plan as established by provider. Discussed plans with patient for ongoing care management follow up and provided patient with direct contact information for care management team Advised patient to call the office for changes in condition, questions or concerns; Provided education to patient re: PD and effective management of PD ; Reviewed medications with patient and discussed compliance ; Discussed plans with patient for ongoing care management follow up and provided patient with direct contact information for care management team; Advised patient to discuss new concerns about PD progression, questions, or concerns with provider; Screening for signs and symptoms of depression related to chronic disease state;  Assessed social determinant of health barriers;  Patient Goals/Self-Care Activities: Take medications as prescribed   Attend all scheduled provider appointments Call pharmacy  for medication refills 3-7 days in advance of running out of medications Attend church or other social activities Perform all self care activities independently  Perform IADL's (shopping, preparing meals, housekeeping, managing finances) independently Call provider office for new concerns or questions  Work with the social worker to address care coordination needs and will continue to work with the clinical team to address health care and disease management related needs call the Suicide and Crisis Lifeline: 988 call the [place] National Suicide Prevention Lifeline: 727-250-4208 or TTY: 508 084 9472 TTY (970)828-2915) to talk to a trained counselor call 1-800-273-TALK (toll free, 24 hour hotline) if experiencing a Mental Health or [place] next appointment is by telephone on 06-27-2021 at 230 pm  Please call the care guide team at [phone] if you need to cancel or reschedule your appointment.   If you are experiencing a Mental Health or [place] or need someone to talk to, please call the Suicide and Crisis Lifeline: 988 call the [place] National Suicide Prevention Lifeline: 203-718-1147 or TTY: (805)605-9474 TTY (313)402-9277) to talk to a trained counselor call 1-800-273-TALK (toll free, 24 hour hotline)   Patient verbalizes understanding of instructions provided today and agrees to view in [place].   Noreene Larsson RN, MSN, [place] Mobile: [phone]

## 2021-05-02 NOTE — Chronic Care Management (AMB) (Signed)
Chronic Care Management   CCM RN Visit Note  05/02/2021 Name: Todd Green MRN: [id number] DOB: [birth year]  Subjective: Todd Green is a 71 y.o. year old male who is a primary care patient of Jearld Fenton, NP. The care management team was consulted for assistance with disease management and care coordination needs.    Engaged with patient by telephone for follow up visit in response to provider referral for case management and/or care coordination services.   Consent to Services:  The patient was given information about Chronic Care Management services, agreed to services, and gave verbal consent prior to initiation of services.  Please see initial visit note for detailed documentation.   Patient agreed to services and verbal consent obtained.   Assessment: Review of patient past medical history, allergies, medications, health status, including review of consultants reports, laboratory and other test data, was performed as part of comprehensive evaluation and provision of chronic care management services.   SDOH (Social Determinants of Health) assessments and interventions performed:    CCM Care Plan  Allergies  Allergen Reactions   Allegra [Fexofenadine] Other (See Comments)    Redness in gums   Sulfa Antibiotics Rash    Outpatient Encounter Medications as of 05/02/2021  Medication Sig   carbidopa-levodopa (SINEMET IR) 25-100 MG tablet Take 1 tablet by mouth 3 (three) times daily.   emtricitabine-tenofovir (TRUVADA) 200-300 MG tablet Take 1 tablet by mouth daily.   fluticasone (FLONASE) 50 MCG/ACT nasal spray USE 2 SPRAYS IN EACH NOSTRIL EVERY DAY   levothyroxine (SYNTHROID) 137 MCG tablet TAKE 1 TABLET (137 MCG TOTAL) BY MOUTH DAILY BEFORE BREAKFAST (NEED MD APPOINTMENT)   omeprazole (PRILOSEC) 40 MG capsule TAKE 1 CAPSULE EVERY DAY   tadalafil (CIALIS) 5 MG tablet Take 5 mg by mouth daily.   Testosterone 20.25 MG/1.25GM (1.62%) GEL Apply 1 application topically daily.    No facility-administered encounter medications on file as of 05/02/2021.    Patient Active Problem List   Diagnosis Date Noted   Chronic neck pain 01/10/2021   Overweight with body mass index (BMI) of 25 to 25.9 in adult 11/09/2020   Parkinson disease ([place]) 06/08/2020   Acquired hypothyroidism 02/02/2017   Acid reflux 02/02/2017    Conditions to be addressed/monitored:Anxiety, Hypothyroidism, and PD, diarrhea, neck pain with limited mobility,    Care Plan : RNCM: General Plan of Care (Adult) for Chronic Disease Management and Care Coordination Needs  Updates made by Vanita Ingles, RN since 05/02/2021 12:00 AM     Problem: RNCM: Development of plan of care for Chronic Disease Management (anxiety, PD, hypothyroidism, neck pain and limited mobility, diarrhea)   Priority: High     Long-Range Goal: RNCM: Effective Management  of plan of care for Chronic Disease Management (anxiety, PD, hypothyroidism, neck pain and limited mobility, diarrhea)   Start Date: 05/02/2021  Expected End Date: 05/02/2022  Priority: High  Note:   Current Barriers:  Knowledge Deficits related to plan of care for management of Anxiety with Excessive Worry, and neck pain and limited mobility, PD, hypothyroidism   Chronic Disease Management support and education needs related to Anxiety with Excessive Worry, and neck pain and limited mobility, PD, and hypothroidism  RNCM Clinical Goal(s):  Patient will verbalize basic understanding of Anxiety, Hypothyroidism, and PD, neck pain and limited mobility disease process and self health management plan as evidenced by stable conditions, calling the office for questions and concerns, and working with the CCM team to optimize health and well being  take all medications exactly as prescribed and will call provider for medication related questions as evidenced by compliance with medication and calling for refills before running out of medications     attend all scheduled medical  appointments: saw pcp on 04-27-2021. GI specialist on 06-08-2021 as evidenced by keeping appointments and calling the office for schedule changes needs        demonstrate improved and ongoing health management independence as evidenced by stable conditions, stable VS, no exacerbations of PD, neck pain or hypothyroidism, stable mental health        demonstrate ongoing self health care management ability for effective management of chronic conditions  as evidenced by working with the CCM team through collaboration with Consulting civil engineer, provider, and care team.   Interventions: 1:1 collaboration with primary care provider regarding development and update of comprehensive plan of care as evidenced by provider attestation and co-signature Inter-disciplinary care team collaboration (see longitudinal plan of care) Evaluation of current treatment plan related to  self management and patient's adherence to plan as established by provider   Anxiety  (Status: Goal on Track (progressing): YES.) Long Term Goal  Evaluation of current treatment plan related to Anxiety, Mental Health Concerns  self-management and patient's adherence to plan as established by provider. Discussed plans with patient for ongoing care management follow up and provided patient with direct contact information for care management team Advised patient to call the office for changes in mood, anxiety, or depression ; Provided education to patient re: working with the CCM team to effectively manage anxiety and mental health concerns; Reviewed medications with patient and discussed compliance ; Discussed plans with patient for ongoing care management follow up and provided patient with direct contact information for care management team; Advised patient to discuss questions or concerns about mental health and anxiety with provider; Screening for signs and symptoms of depression related to chronic disease state;  Assessed social determinant of  health barriers;     Acute onset of Diarrhea   (Status: Goal on Track (progressing): YES.) Short Term Goal  Evaluation of current treatment plan related to  diarrhea ,  self-management and patient's adherence to plan as established by provider. 05-02-2021: The patient states that the diarrhea has resolved now but he is still keeping his appointment with the GI specialist in February. The patient feels like he picked up a bacteria from an all male whirlpool that he used back in December. He thought about it and feels certain this is what happened. He saw the pcp once in December with an ER visit on 04-19-2021 and 04-24-2021 and a recent visit to the pcp on 04-27-2021.  Discussed plans with patient for ongoing care management follow up and provided patient with direct contact information for care management team Advised patient to call the office for new onset of diarrhea, to keep upcoming appointment with GI provider and to be safe in public access areas such as whirlpools and other areas where bacteria can be transmitted. ; Provided education to patient re: hygiene, monitoring for sx and sx of infection and monitoring for changes in bowel habits; Reviewed scheduled/upcoming provider appointments including 06-08-2021 at 230 pm; Discussed plans with patient for ongoing care management follow up and provided patient with direct contact information for care management team;   Hypothyroidism   (Status: Goal on Track (progressing): YES.) Long Term Goal  Evaluation of current treatment plan related to Hypothyroidism,  self-management and patient's adherence to plan as established by provider. Discussed  plans with patient for ongoing care management follow up and provided patient with direct contact information for care management team Advised patient to call the office for changes in his hypothyroidism, questions or concerns; Reviewed medications with patient and discussed compliance ; Discussed plans with patient  for ongoing care management follow up and provided patient with direct contact information for care management team; Advised patient to discuss new concerns about his thyroid health with provider;    Neck Pain with limited Mobility  (Status: Goal on Track (progressing): YES.) Long Term Goal  Pain assessment performed. 05-02-2021: the patient denies any pain today but states that the physical therapy has really helped. The patient needs a new referral sent to [facility] for PT to continue. In basket message sent to the pcp and CMA.  Medications reviewed. 05-02-2021: States compliance with medications  Reviewed provider established plan for pain management; Discussed importance of adherence to all scheduled medical appointments; Counseled on the importance of reporting any/all new or changed pain symptoms or management strategies to pain management provider; Advised patient to report to care team affect of pain on daily activities; Discussed use of relaxation techniques and/or diversional activities to assist with pain reduction (distraction, imagery, relaxation, massage, acupressure, TENS, heat, and cold application; Reviewed with patient prescribed pharmacological and nonpharmacological pain relief strategies; Advised patient to discuss unresolved pain, changes in level or intensity of pain  with provider; 05-02-2021: The patient states that he has changes insurance provider and where he was getting physical therapy his new insurance will not cover that provider. He has an appointment with Waldemar Dickens on 05-22-2021 and needs a referral/authorization sent to [facility] from the pcp before services can begin. RNCM called [facility] and obtained the fax number. The number is [phone]. In basket message sent to pcp and CMA asking for assistance with referral to [facility]. The pcp has placed a referral to [place] [place] and the patient was called back and notified of referral being placed. Denies any other  concerns at this time. Will continue to monitor.   PD  (Status: Goal on Track (progressing): YES.) Long Term Goal  Evaluation of current treatment plan related to  PD ,  self-management and patient's adherence to plan as established by provider. Discussed plans with patient for ongoing care management follow up and provided patient with direct contact information for care management team Advised patient to call the office for changes in condition, questions or concerns; Provided education to patient re: PD and effective management of PD ; Reviewed medications with patient and discussed compliance ; Discussed plans with patient for ongoing care management follow up and provided patient with direct contact information for care management team; Advised patient to discuss new concerns about PD progression, questions, or concerns with provider; Screening for signs and symptoms of depression related to chronic disease state;  Assessed social determinant of health barriers;   Patient Goals/Self-Care Activities: Take medications as prescribed   Attend all scheduled provider appointments Call pharmacy for medication refills 3-7 days in advance of running out of medications Attend church or other social activities Perform all self care activities independently  Perform IADL's (shopping, preparing meals, housekeeping, managing finances) independently Call provider office for new concerns or questions  Work with the social worker to address care coordination needs and will continue to work with the clinical team to address health care and disease management related needs call the Suicide and Crisis Lifeline: 988 call the [place] National Suicide Prevention Lifeline:  (774) 701-8425 or TTY: 1-800-799-4 TTY (539)224-9917) to talk to a trained counselor call 1-800-273-TALK (toll free, 24 hour hotline) if experiencing a Mental Health or [place] follow up appointment  with care management team member scheduled for:  06-27-2021 at 78 pm  Noreene Larsson RN, MSN, [facility] Mobile: [phone]

## 2021-05-03 DIAGNOSIS — M542 Cervicalgia: Secondary | ICD-10-CM | POA: Diagnosis not present

## 2021-05-03 DIAGNOSIS — M7918 Myalgia, other site: Secondary | ICD-10-CM | POA: Diagnosis not present

## 2021-05-05 DIAGNOSIS — M542 Cervicalgia: Secondary | ICD-10-CM | POA: Diagnosis not present

## 2021-05-06 DIAGNOSIS — L600 Ingrowing nail: Secondary | ICD-10-CM | POA: Diagnosis not present

## 2021-05-06 DIAGNOSIS — M79674 Pain in right toe(s): Secondary | ICD-10-CM | POA: Diagnosis not present

## 2021-05-06 DIAGNOSIS — M79675 Pain in left toe(s): Secondary | ICD-10-CM | POA: Diagnosis not present

## 2021-05-06 DIAGNOSIS — B351 Tinea unguium: Secondary | ICD-10-CM | POA: Diagnosis not present

## 2021-05-18 ENCOUNTER — Telehealth: Payer: Self-pay

## 2021-05-18 NOTE — Telephone Encounter (Signed)
Copied from [place] [id number]. Topic: General - Other >> May 18, 2021 12:14 PM Tessa Lerner A wrote: Reason for CRM: The patient would like their referral for physical therapy [facility] via fax at [phone]  Please contact further if needed

## 2021-05-18 NOTE — Telephone Encounter (Signed)
This referral was placed 05/02/21

## 2021-05-19 DIAGNOSIS — M436 Torticollis: Secondary | ICD-10-CM | POA: Diagnosis not present

## 2021-05-19 NOTE — Telephone Encounter (Signed)
The pt  has an appt with [facility] on late today.

## 2021-05-23 ENCOUNTER — Other Ambulatory Visit: Payer: Self-pay | Admitting: Family Medicine

## 2021-05-23 DIAGNOSIS — K449 Diaphragmatic hernia without obstruction or gangrene: Secondary | ICD-10-CM

## 2021-05-23 DIAGNOSIS — E890 Postprocedural hypothyroidism: Secondary | ICD-10-CM

## 2021-05-23 DIAGNOSIS — K219 Gastro-esophageal reflux disease without esophagitis: Secondary | ICD-10-CM

## 2021-05-23 NOTE — Telephone Encounter (Signed)
Requested Prescriptions  Pending Prescriptions Disp Refills   omeprazole (PRILOSEC) 40 MG capsule [Pharmacy Med Name: OMEPRAZOLE 40 MG Capsule Delayed Release] 90 capsule 0    Sig: TAKE 1 CAPSULE EVERY DAY     Gastroenterology: Proton Pump Inhibitors Passed - 05/23/2021 11:00 AM      Passed - Valid encounter within last 12 months    Recent Outpatient Visits          3 weeks ago Diarrhea of presumed infectious origin   [name] [place], [place], NP   1 month ago Diarrhea of presumed infectious origin   [facility] [place], Coralie Keens, NP   4 months ago [facility] [place], Coralie Keens, NP   6 months ago Vasovagal syncope   [facility] [place], Coralie Keens, NP   10 months ago DDD (degenerative disc disease), cervical   [place], DO      Future Appointments            In 4 months [facility], [facility]             levothyroxine (SYNTHROID) 137 MCG tablet [Pharmacy Med Name: LEVOTHYROXINE SODIUM 137 MCG Tablet] 90 tablet 1    Sig: TAKE 1 TABLET EVERY [place] (NEED MD APPOINTMENT)     Endocrinology:  Hypothyroid Agents Failed - 05/23/2021 11:00 AM      Failed - TSH needs to be rechecked within 3 months after an abnormal result. Refill until TSH is due.      Passed - TSH in normal range and within 360 days    TSH  Date Value Ref Range Status  06/08/2020 1.61 0.40 - 4.50 mIU/L Final         Passed - Valid encounter within last 12 months    Recent Outpatient Visits          3 weeks ago Diarrhea of presumed infectious origin   [place], [place], NP   1 month ago Diarrhea of presumed infectious origin   [place], Coralie Keens, NP   4 months ago [facility] [place], Coralie Keens, NP   6 months ago Vasovagal syncope   [name] [place], Coralie Keens, NP    10 months ago DDD (degenerative disc disease), cervical   [place], DO      Future Appointments            In 4 months [facility], [facility]

## 2021-05-24 ENCOUNTER — Other Ambulatory Visit: Payer: Self-pay | Admitting: Internal Medicine

## 2021-05-24 DIAGNOSIS — G8929 Other chronic pain: Secondary | ICD-10-CM

## 2021-05-24 DIAGNOSIS — E039 Hypothyroidism, unspecified: Secondary | ICD-10-CM

## 2021-05-24 NOTE — Telephone Encounter (Signed)
° °  Referral placed           Copied from [place] [id number]. Topic: General - Other >> May 18, 2021 12:14 PM Tessa Lerner A wrote: Reason for CRM: The patient would like their referral for physical therapy [facility] via fax at [phone]  Please contact further if needed >> May 24, 2021  8:24 AM Alanda Slim E wrote: [facility] was too much for pt to get to so he is asking for another referral to be placed at [facility] and there fax number is [phone]/ phone # [phone]/please advise asap

## 2021-05-26 ENCOUNTER — Other Ambulatory Visit: Payer: Self-pay | Admitting: Internal Medicine

## 2021-05-26 DIAGNOSIS — J301 Allergic rhinitis due to pollen: Secondary | ICD-10-CM

## 2021-05-26 NOTE — Telephone Encounter (Signed)
Requested medication (s) are due for refill today:   yes  Requested medication (s) are on the active medication list:   Yes  Future visit scheduled:   Not sure  There's a date set aside but no provider or time   Last ordered: 12/06/2020 48g, 0 refills  Returned because non delegated per new protocol  Requested Prescriptions  Pending Prescriptions Disp Refills   fluticasone (FLONASE) 50 MCG/ACT nasal spray [Pharmacy Med Name: FLUTICASONE PROPIONATE 50 MCG/ACT Suspension] 48 g 0    Sig: USE 2 SPRAYS IN EACH NOSTRIL EVERY DAY     Not Delegated - Ear, Nose, and Throat: Nasal Preparations - Corticosteroids Failed - 05/26/2021 12:39 PM      Failed - This refill cannot be delegated      Passed - Valid encounter within last 12 months    Recent Outpatient Visits           4 weeks ago Diarrhea of presumed infectious origin   [place], Pico Rivera, NP   1 month ago Diarrhea of presumed infectious origin   [place], Coralie Keens, NP   4 months ago [facility], Coralie Keens, NP   6 months ago Vasovagal syncope   [facility], Coralie Keens, NP   10 months ago DDD (degenerative disc disease), cervical   [place], DO       Future Appointments             In 4 months [name], [facility]

## 2021-05-28 ENCOUNTER — Other Ambulatory Visit: Payer: Self-pay

## 2021-05-28 ENCOUNTER — Emergency Department
Admission: EM | Admit: 2021-05-28 | Discharge: 2021-05-28 | Disposition: A | Discharge status: Home / Self Care | Payer: Medicare HMO | Attending: Emergency Medicine | Admitting: Emergency Medicine

## 2021-05-28 ENCOUNTER — Emergency Department: Payer: Medicare HMO

## 2021-05-28 DIAGNOSIS — S01111A Laceration without foreign body of right eyelid and periocular area, initial encounter: Secondary | ICD-10-CM | POA: Insufficient documentation

## 2021-05-28 DIAGNOSIS — S060X0A Concussion without loss of consciousness, initial encounter: Secondary | ICD-10-CM | POA: Insufficient documentation

## 2021-05-28 DIAGNOSIS — Y9239 Other specified sports and athletic area as the place of occurrence of the external cause: Secondary | ICD-10-CM | POA: Insufficient documentation

## 2021-05-28 DIAGNOSIS — W19XXXA Unspecified fall, initial encounter: Secondary | ICD-10-CM | POA: Diagnosis not present

## 2021-05-28 DIAGNOSIS — S0990XA Unspecified injury of head, initial encounter: Secondary | ICD-10-CM | POA: Diagnosis not present

## 2021-05-28 DIAGNOSIS — R58 Hemorrhage, not elsewhere classified: Secondary | ICD-10-CM | POA: Diagnosis not present

## 2021-05-28 DIAGNOSIS — W01198A Fall on same level from slipping, tripping and stumbling with subsequent striking against other object, initial encounter: Secondary | ICD-10-CM | POA: Insufficient documentation

## 2021-05-28 DIAGNOSIS — S0993XA Unspecified injury of face, initial encounter: Secondary | ICD-10-CM | POA: Diagnosis not present

## 2021-05-28 DIAGNOSIS — T148XXA Other injury of unspecified body region, initial encounter: Secondary | ICD-10-CM

## 2021-05-28 DIAGNOSIS — I10 Essential (primary) hypertension: Secondary | ICD-10-CM | POA: Diagnosis not present

## 2021-05-28 DIAGNOSIS — S0083XA Contusion of other part of head, initial encounter: Secondary | ICD-10-CM

## 2021-05-28 DIAGNOSIS — S060XAA Concussion with loss of consciousness status unknown, initial encounter: Secondary | ICD-10-CM

## 2021-05-28 DIAGNOSIS — S199XXA Unspecified injury of neck, initial encounter: Secondary | ICD-10-CM | POA: Diagnosis not present

## 2021-05-28 MED ORDER — SM DOUBLE ANTIBIOTIC 500-10000 UNIT/GM EX OINT
TOPICAL_OINTMENT | Freq: Two times a day (BID) | CUTANEOUS | Status: DC
  Filled 2021-05-28: qty 1

## 2021-05-28 MED ORDER — CARBIDOPA-LEVODOPA ER 25-100 MG PO TBCR
1.0000 | EXTENDED_RELEASE_TABLET | Freq: Once | ORAL | Status: AC
  Administered 2021-05-28: 1 via ORAL
  Filled 2021-05-28: qty 1

## 2021-05-28 NOTE — ED Notes (Signed)
Pt BIB EMS for mechanical trip & fall. Pt tripped over a wire sticking out of the ground, fell, hit forehead. Witnessed fall; no LOC. Pt has lac to forehead & lac above right eye.

## 2021-05-28 NOTE — ED Provider Notes (Signed)
[facility] Provider Note    Event Date/Time   First MD Initiated Contact with Patient 05/28/21 1226     (approximate)   History   Fall  HPI Todd Green is a 71 y.o. male with a stated past medical history of parkinsonism who presents after mechanical fall from standing in an attempt to go to the gym today.  Patient states that he just got bifocals and is unsteady using the magnifying aspect of the bottom of the glasses and feels that this was the cause for his fall.  Patient does not know whether he lost consciousness or not.  Patient states that his most recent tetanus update was approximately 1 year prior to arrival.     Physical Exam   Triage Vital Signs: ED Triage Vitals  Enc Vitals Group     BP 05/28/21 1222 (!) 156/83     Pulse Rate 05/28/21 1222 87     Resp 05/28/21 1222 18     Temp 05/28/21 1222 97.6 F (36.4 C)     Temp Source 05/28/21 1222 Oral     SpO2 05/28/21 1222 100 %     Weight 05/28/21 1226 170 lb (77.1 kg)     Height 05/28/21 1226 5\' 9"  (1.753 m)     Head Circumference --      Peak Flow --      Pain Score 05/28/21 1226 2     Pain Loc --      Pain Edu? --      Excl. in [place]? --     Most recent vital signs: Vitals:   05/28/21 1230 05/28/21 1300  BP: 133/84 (!) 160/72  Pulse: 90 88  Resp:  18  Temp:    SpO2: 98% 96%    General: Awake, no distress.  CV:  Good peripheral perfusion.  Resp:  Normal effort.  Abd:  No distention.  Other:  Resting tremor in left hand.  Superficial abrasions to the mid forehead as well as a 2 cm laceration to the right eyebrow   ED Results / Procedures / Treatments   Labs (all labs ordered are listed, but only abnormal results are displayed) Labs Reviewed - No data to display  RADIOLOGY  ED MD interpretation: CT of the head without contrast shows no evidence of acute abnormalities including no intracerebral hemorrhage, obvious masses, or significant edema  CT of the cervical spine  does not show any evidence of acute abnormalities including no acute fracture, malalignment, height loss, or dislocation  CT of the maxillofacial structures without contrast shows no evidence of acute abnormalities including no acute fractures, malalignment, dislocation  Agree with radiology assessment  Official radiology report(s): No results found.    PROCEDURES:  Critical Care performed: No  Procedures   MEDICATIONS ORDERED IN ED: Medications  Carbidopa-Levodopa ER (SINEMET CR) 25-100 MG tablet controlled release 1 tablet (1 tablet Oral Given 05/28/21 1453)     IMPRESSION / MDM / ASSESSMENT AND PLAN / ED COURSE  I reviewed the triage vital signs and the nursing notes.                              Patient presenting with head trauma.  Patient's neurological exam was non-focal and unremarkable.  [place] Head CT Rule was applied and patient did not fall into the low risk category so a head CT was obtained.  CT C-spine and CT max face added secondary  to trauma.  This showed no significant findings.  At this time, it is felt that the most likely explanation for the patient's symptoms is concussion.   I also considered SAH, SDH, Epidural Hematoma, IPH, skull fracture, migraine but this appears less likely considering the data gathered thus far.   Patient provided laceration repair; please see procedure note for full details.   Patient remained stable and neurologically intact while in the emergency department.  Discussed warning signs that would prompt return to ED.  Head trauma handout was provided.  Discussed in detail concussion management.  No sports or strenuous activity until symptoms free.  Return to emergency department urgently if new or worsening symptoms develop.    Impression:  Concussion Facial contusions Facial abrasions Right eyebrow laceration  Plan  Discharge from ED Tylenol for pain control. Avoid aspirin, NSAIDs, or other blood thinners. Advised patient on  supportive measures for cognitive rest - avoid use of cognitive function for at least 24 hours.  This means no tv, books, texting, computers, etc. Limit visitors to the house.  Head trauma instructions provided in discharge instructions Instructed Pt to monitor for neurologic symptoms, severe HA, change in mental status, seizures, loss of conciousness. Instructed Pt to f/up w/ PCP in 7 days or ETC should symptoms worsen or not improve. Pt verbally expressed understanding and all questions were addressed to Pt's satisfaction.      FINAL CLINICAL IMPRESSION(S) / ED DIAGNOSES   Final diagnoses:  Fall, initial encounter  Contusion of face, initial encounter  Abrasion  Concussion with unknown loss of consciousness status, initial encounter     Rx / DC Orders   ED Discharge Orders     None        Note:  This document was prepared using Dragon voice recognition software and may include unintentional dictation errors.   Naaman Plummer, MD 05/31/21 1556

## 2021-05-28 NOTE — ED Notes (Signed)
Blood from wounds cleaned from Pt's face with hydrogen peroxide by this tech. Pt tolerated well, wounds were not touched.

## 2021-05-28 NOTE — ED Triage Notes (Signed)
Pt BIB EMS for trip & fall. Pt was outside of [employer] & tripped & fell, hitting forehead. Witnessed fall, no LOC. Pt has lac above R eye & lac to forehead. Bleeding controlled. Last tetanus shot was 1 year ago.

## 2021-06-01 ENCOUNTER — Encounter: Payer: Self-pay | Admitting: Internal Medicine

## 2021-06-01 ENCOUNTER — Other Ambulatory Visit: Payer: Self-pay

## 2021-06-01 ENCOUNTER — Ambulatory Visit (INDEPENDENT_AMBULATORY_CARE_PROVIDER_SITE_OTHER): Payer: Medicare HMO | Admitting: Internal Medicine

## 2021-06-01 VITALS — BP 126/83 | HR 87 | Temp 97.8°F | Wt 167.0 lb

## 2021-06-01 DIAGNOSIS — S0990XD Unspecified injury of head, subsequent encounter: Secondary | ICD-10-CM | POA: Diagnosis not present

## 2021-06-01 DIAGNOSIS — S0081XD Abrasion of other part of head, subsequent encounter: Secondary | ICD-10-CM | POA: Diagnosis not present

## 2021-06-01 DIAGNOSIS — G8929 Other chronic pain: Secondary | ICD-10-CM

## 2021-06-01 DIAGNOSIS — M542 Cervicalgia: Secondary | ICD-10-CM

## 2021-06-01 DIAGNOSIS — S060X0D Concussion without loss of consciousness, subsequent encounter: Secondary | ICD-10-CM

## 2021-06-01 DIAGNOSIS — W19XXXD Unspecified fall, subsequent encounter: Secondary | ICD-10-CM | POA: Diagnosis not present

## 2021-06-01 DIAGNOSIS — S01111D Laceration without foreign body of right eyelid and periocular area, subsequent encounter: Secondary | ICD-10-CM | POA: Diagnosis not present

## 2021-06-01 MED ORDER — CARBIDOPA-LEVODOPA 25-100 MG PO TABS: 1.0000 | ORAL_TABLET | Freq: Three times a day (TID) | ORAL | 0 refills | Status: AC

## 2021-06-01 NOTE — Assessment & Plan Note (Signed)
Continue working with PT

## 2021-06-01 NOTE — Patient Instructions (Signed)
Abrasion An abrasion is a cut or a scrape on your skin. You must take care of your wound so germs do not get in it and cause infection. What are the causes? This condition is caused by rubbing your skin on something or falling on a surface, such as the ground. When your skin rubs on something, some layers of skin may rub off. What are the signs or symptoms? A cut or a scrape. Bleeding. A red or pink spot. A bruise under your wound. How is this treated? This condition may be treated with: Cleaning your wound. Putting ointment on your wound. Putting a bandage on your wound. Getting a tetanus shot. Follow these instructions at home: Your doctor may tell you to do these things: Medicines Take or use over-the-counter and prescription medicines only as told by your doctor. If you were prescribed an antibiotic medicine, use it as told by your doctor. Do not stop using it even if you start to feel better. Keep your wound clean Clean your wound 1 or 2 times a day or as often told by your doctor. To do this: Wash your hands for at least 20 seconds with mild soap and water. Do this before and after you clean your wound. Wash your wound with mild soap and water. Rinse off the soap. Pat your wound with a clean towel to dry it. Do not rub your wound. Keep your bandage clean and dry. Take it off and change it as told by your doctor. You may have to change your bandage one or more times a day, or as told by your doctor. Watch for signs of infection Check your wound every day for signs of infection. Check for: A red streak that goes away from your wound. Other redness. Swelling or more pain. Warmth. Blood, fluid, pus, or a bad smell. Treat pain and swelling  If told, put ice on the injured area. To do this: Put ice in a plastic bag. Place a towel between your skin and the bag. Leave the ice on for 20 minutes, 2-3 times a day. Take off the ice if your skin turns bright red. This is very  important. If you cannot feel pain, heat, or cold, you have a greater risk of damage to the area. If you can, raise the injured area above the level of your heart while you are sitting or lying down. General instructions Do not take baths, swim, or use a hot tub. Ask your doctor about taking showers or sponge baths. Keep all follow-up visits. Contact a doctor if: You had a tetanus shot, and you have any of these where the needle went in: Swelling. Very bad pain. Redness. Bleeding. You have a lot of pain, and medicine does not help. You have a fever. You have any of these signs of infection in your wound: Redness, swelling, or more pain. Blood, fluid, pus, or a bad smell. Warmth. Get help right away if: You have a red streak going away from your wound. Summary An abrasion is a cut or a scrape on your skin. Take care of your wound so germs do not get in it. Clean your wound 1 or 2 times a day or as often as told. Change your bandage as told and use medicines as told. Call your doctor if you have a fever or if you have redness, swelling, or more pain in your wound. Call your doctor if you have blood, fluid, pus, or a bad smell in your wound.  Get help right away if you have a red streak going away from your wound. This information is not intended to replace advice given to you by your health care provider. Make sure you discuss any questions you have with your health care provider. Document Revised: 07/10/2019 Document Reviewed: 07/10/2019 Elsevier Patient Education  [place].
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
## 2021-06-07 DIAGNOSIS — R972 Elevated prostate specific antigen [PSA]: Secondary | ICD-10-CM | POA: Diagnosis not present

## 2021-06-07 DIAGNOSIS — E291 Testicular hypofunction: Secondary | ICD-10-CM | POA: Diagnosis not present

## 2021-06-07 DIAGNOSIS — N5201 Erectile dysfunction due to arterial insufficiency: Secondary | ICD-10-CM | POA: Diagnosis not present

## 2021-06-07 DIAGNOSIS — Z79899 Other long term (current) drug therapy: Secondary | ICD-10-CM | POA: Diagnosis not present

## 2021-06-07 DIAGNOSIS — N401 Enlarged prostate with lower urinary tract symptoms: Secondary | ICD-10-CM | POA: Diagnosis not present

## 2021-06-08 ENCOUNTER — Ambulatory Visit: Payer: Medicare HMO | Admitting: Gastroenterology

## 2021-06-08 DIAGNOSIS — E291 Testicular hypofunction: Secondary | ICD-10-CM | POA: Diagnosis not present

## 2021-06-08 DIAGNOSIS — Z79899 Other long term (current) drug therapy: Secondary | ICD-10-CM | POA: Diagnosis not present

## 2021-06-08 DIAGNOSIS — N401 Enlarged prostate with lower urinary tract symptoms: Secondary | ICD-10-CM | POA: Diagnosis not present

## 2021-06-16 DIAGNOSIS — M542 Cervicalgia: Secondary | ICD-10-CM | POA: Diagnosis not present

## 2021-06-27 ENCOUNTER — Telehealth: Payer: Medicare HMO

## 2021-06-27 ENCOUNTER — Ambulatory Visit (INDEPENDENT_AMBULATORY_CARE_PROVIDER_SITE_OTHER): Payer: Medicare HMO

## 2021-06-27 DIAGNOSIS — W19XXXD Unspecified fall, subsequent encounter: Secondary | ICD-10-CM

## 2021-06-27 DIAGNOSIS — E890 Postprocedural hypothyroidism: Secondary | ICD-10-CM

## 2021-06-27 DIAGNOSIS — G20 Parkinson's disease: Secondary | ICD-10-CM

## 2021-06-27 DIAGNOSIS — Z9181 History of falling: Secondary | ICD-10-CM

## 2021-06-27 DIAGNOSIS — S0081XD Abrasion of other part of head, subsequent encounter: Secondary | ICD-10-CM

## 2021-06-27 DIAGNOSIS — F419 Anxiety disorder, unspecified: Secondary | ICD-10-CM

## 2021-06-27 DIAGNOSIS — G8929 Other chronic pain: Secondary | ICD-10-CM

## 2021-06-27 DIAGNOSIS — E039 Hypothyroidism, unspecified: Secondary | ICD-10-CM

## 2021-06-27 DIAGNOSIS — M542 Cervicalgia: Secondary | ICD-10-CM

## 2021-06-27 NOTE — Chronic Care Management (AMB) (Signed)
Chronic Care Management   CCM RN Visit Note  06/27/2021 Name: Todd Green MRN: [id number] DOB: 1950-06-24  Subjective: Todd Green is a 71 y.o. year old male who is a primary care patient of Jearld Fenton, NP. The care management team was consulted for assistance with disease management and care coordination needs.    Engaged with patient by telephone for follow up visit in response to provider referral for case management and/or care coordination services.   Consent to Services:  The patient was given information about Chronic Care Management services, agreed to services, and gave verbal consent prior to initiation of services.  Please see initial visit note for detailed documentation.   Patient agreed to services and verbal consent obtained.   Assessment: Review of patient past medical history, allergies, medications, health status, including review of consultants reports, laboratory and other test data, was performed as part of comprehensive evaluation and provision of chronic care management services.   SDOH (Social Determinants of Health) assessments and interventions performed:    CCM Care Plan  Allergies  Allergen Reactions   Allegra [Fexofenadine] Other (See Comments)    Redness in gums   Sulfa Antibiotics Rash    Outpatient Encounter Medications as of 06/27/2021  Medication Sig   carbidopa-levodopa (SINEMET IR) 25-100 MG tablet Take 1 tablet by mouth 3 (three) times daily.   emtricitabine-tenofovir (TRUVADA) 200-300 MG tablet Take 1 tablet by mouth daily.   fluticasone (FLONASE) 50 MCG/ACT nasal spray USE 2 SPRAYS IN EACH NOSTRIL EVERY DAY   levothyroxine (SYNTHROID) 137 MCG tablet TAKE 1 TABLET EVERY DAY BEFORE BREAKFAST (NEED MD APPOINTMENT)   omeprazole (PRILOSEC) 40 MG capsule TAKE 1 CAPSULE EVERY DAY   tadalafil (CIALIS) 5 MG tablet Take 5 mg by mouth daily.   Testosterone 20.25 MG/1.25GM (1.62%) GEL Apply 1 application topically daily.   No  facility-administered encounter medications on file as of 06/27/2021.    Patient Active Problem List   Diagnosis Date Noted   Chronic neck pain 01/10/2021   Parkinson disease ([place]) 06/08/2020   Acquired hypothyroidism 02/02/2017   Acid reflux 02/02/2017    Conditions to be addressed/monitored:Anxiety, Hypothyroidism, and PD, Chronic neck pain, limited mobility, fall risk  Care Plan : RNCM: General Plan of Care (Adult) for Chronic Disease Management and Care Coordination Needs  Updates made by Vanita Ingles, RN since 06/27/2021 12:00 AM     Problem: RNCM: Development of plan of care for Chronic Disease Management (anxiety, PD, hypothyroidism, neck pain and limited mobility, diarrhea)   Priority: High     Long-Range Goal: RNCM: Effective Management  of plan of care for Chronic Disease Management (anxiety, PD, hypothyroidism, neck pain and limited mobility, diarrhea)   Start Date: 05/02/2021  Expected End Date: 05/02/2022  Priority: High  Note:   Current Barriers:  Knowledge Deficits related to plan of care for management of Anxiety with Excessive Worry, and neck pain and limited mobility, PD, hypothyroidism   Chronic Disease Management support and education needs related to Anxiety with Excessive Worry, and neck pain and limited mobility, PD, and hypothroidism  RNCM Clinical Goal(s):  Patient will verbalize basic understanding of Anxiety, Hypothyroidism, and PD, neck pain and limited mobility disease process and self health management plan as evidenced by stable conditions, calling the office for questions and concerns, and working with the CCM team to optimize health and well being take all medications exactly as prescribed and will call provider for medication related questions as evidenced by compliance with medication  and calling for refills before running out of medications     attend all scheduled medical appointments: saw pcp on 06-01-2021. Upcoming appointment with the pcp on 07-07-2021  and sees Orthopedic provider on 06-29-2021 as evidenced by keeping appointments and calling the office for schedule changes needs        demonstrate improved and ongoing health management independence as evidenced by stable conditions, stable VS, no exacerbations of PD, neck pain or hypothyroidism, stable mental health        demonstrate ongoing self health care management ability for effective management of chronic conditions  as evidenced by working with the CCM team through collaboration with Consulting civil engineer, provider, and care team.   Interventions: 1:1 collaboration with primary care provider regarding development and update of comprehensive plan of care as evidenced by provider attestation and co-signature Inter-disciplinary care team collaboration (see longitudinal plan of care) Evaluation of current treatment plan related to  self management and patient's adherence to plan as established by provider   Anxiety  (Status: Goal on Track (progressing): YES.) Long Term Goal  Evaluation of current treatment plan related to Anxiety, Mental Health Concerns  self-management and patient's adherence to plan as established by provider. 06-27-2021: The patient had a fall on 05-28-2021 and was at [facility]. He had a laceration to his head. CT was unremarkable. The patient states that he was adjusting to his bifocals and the combination of neck pain he went down. He broke his glasses and a lady called EMS. He was followed up in the office by the pcp on 06-01-2021. The patient states the laceration has healed and he is doing well. The patient states that he does want to get his thyroid level and testosterone level checked when he comes in to see the pcp on 07-07-2021. Will let pcp know of the patients wishes.  Discussed plans with patient for ongoing care management follow up and provided patient with direct contact information for care management team Advised patient to call the office for changes in mood, anxiety, or  depression ; Provided education to patient re: working with the CCM team to effectively manage anxiety and mental health concerns; Reviewed medications with patient and discussed compliance. 06-27-2021: The patient is compliant with medications ; Discussed plans with patient for ongoing care management follow up and provided patient with direct contact information for care management team; Advised patient to discuss questions or concerns about mental health and anxiety with provider; Screening for signs and symptoms of depression related to chronic disease state;  Assessed social determinant of health barriers;     Acute onset of Diarrhea   (Status: Goal Met.) Short Term Goal 06-27-2021: Closing this goal as this goal has been met Evaluation of current treatment plan related to  diarrhea ,  self-management and patient's adherence to plan as established by provider. 05-02-2021: The patient states that the diarrhea has resolved now but he is still keeping his appointment with the GI specialist in February. The patient feels like he picked up a bacteria from an all male whirlpool that he used back in December. He thought about it and feels certain this is what happened. He saw the pcp once in December with an ER visit on 04-19-2021 and 04-24-2021 and a recent visit to the pcp on 04-27-2021.  Discussed plans with patient for ongoing care management follow up and provided patient with direct contact information for care management team Advised patient to call the office for new onset of diarrhea, to  keep upcoming appointment with GI provider and to be safe in public access areas such as whirlpools and other areas where bacteria can be transmitted. ; Provided education to patient re: hygiene, monitoring for sx and sx of infection and monitoring for changes in bowel habits; Reviewed scheduled/upcoming provider appointments including 06-08-2021 at 230 pm; Discussed plans with patient for ongoing care management follow  up and provided patient with direct contact information for care management team;   Hypothyroidism   (Status: Goal on Track (progressing): YES.) Long Term Goal  Evaluation of current treatment plan related to Hypothyroidism,  self-management and patient's adherence to plan as established by provider. 06-27-2021: The patient states he is doing well. He has had his thyroid removed a few years ago and he wants to get his level checked at his upcoming pcp appointment. Denies any new concerns with hypothyroidism Discussed plans with patient for ongoing care management follow up and provided patient with direct contact information for care management team Advised patient to call the office for changes in his hypothyroidism, questions or concerns; Reviewed medications with patient and discussed compliance. 06-27-2021: The patient is compliant with medications; Discussed plans with patient for ongoing care management follow up and provided patient with direct contact information for care management team; Advised patient to discuss new concerns about his thyroid health with provider;    Neck Pain with limited Mobility  (Status: Goal on Track (progressing): YES.) Long Term Goal  Pain assessment performed. 05-02-2021: the patient denies any pain today but states that the physical therapy has really helped. The patient needs a new referral sent to [facility] for PT to continue. In basket message sent to the pcp and CMA. 06-27-2021: The patient states that his pain when he gets up in the mornings is "off the charts". Once he gets to moving around it is a lot better and he can functions better. He is taking PT at the gym he goes to and is going to the orthopedic provider on 06-29-2021 for evaluation and treatment options.  Medications reviewed. 06-27-2021: States compliance with medications  Reviewed provider established plan for pain management; Discussed importance of adherence to all scheduled medical  appointments; Counseled on the importance of reporting any/all new or changed pain symptoms or management strategies to pain management provider; Advised patient to report to care team affect of pain on daily activities; Discussed use of relaxation techniques and/or diversional activities to assist with pain reduction (distraction, imagery, relaxation, massage, acupressure, TENS, heat, and cold application; Reviewed with patient prescribed pharmacological and nonpharmacological pain relief strategies; Advised patient to discuss unresolved pain, changes in level or intensity of pain  with provider; 05-02-2021: The patient states that he has changes insurance provider and where he was getting physical therapy his new insurance will not cover that provider. He has an appointment with Waldemar Dickens on 05-22-2021 and needs a referral/authorization sent to [facility] from the pcp before services can begin. RNCM called [facility] and obtained the fax number. The number is [phone]. In basket message sent to pcp and CMA asking for assistance with referral to [facility]. The pcp has placed a referral to [facility] and the patient was called back and notified of referral being placed. Denies any other concerns at this time. Will continue to monitor.   PD  (Status: Goal on Track (progressing): YES.) Long Term Goal  Evaluation of current treatment plan related to  PD ,  self-management and patient's adherence to plan as established by  provider. 06-27-2021: Denies any new issues with PD. The patient is doing well and states he is compliant with the plan of care. Discussed plans with patient for ongoing care management follow up and provided patient with direct contact information for care management team Advised patient to call the office for changes in condition, questions or concerns; Provided education to patient re: PD and effective management of PD ; Reviewed medications with patient and discussed compliance.  06-27-2021: Is compliant with the plan of care and medications regimen for PD ; Discussed plans with patient for ongoing care management follow up and provided patient with direct contact information for care management team; Advised patient to discuss new concerns about PD progression, questions, or concerns with provider; Screening for signs and symptoms of depression related to chronic disease state;  Assessed social determinant of health barriers;    Falls:  (Status: New goal. Goal on Track (progressing): YES.) Long Term Goal  Provided written and verbal education re: potential causes of falls and Fall prevention strategies Reviewed medications and discussed potential side effects of medications such as dizziness and frequent urination Advised patient of importance of notifying provider of falls. 06-27-2021: The patient was evaluated in the ER for a fall on 05-28-2021 and followed up with the pcp on 06-01-2021. Received a laceration that has now healed.  Assessed for signs and symptoms of orthostatic hypotension Assessed for falls since last encounter. Last fall noted per the patient was 05-28-2021. The patient states that he fell when he was coming out of [employer] and it was a combination of his new biofocals and his neck pain. He states that he has not had any new falls. Will continue to monitor for changes.  Assessed patients knowledge of fall risk prevention secondary to previously provided education Provided patient information for fall alert systems Assessed working status of life alert bracelet and patient adherence Advised patient to discuss new falls, changes in condition related to fall in February, and new concerns with provider Screening for signs and symptoms of depression related to chronic disease state Assessed social determinant of health barriers   Patient Goals/Self-Care Activities: Take medications as prescribed   Attend all scheduled provider appointments Call pharmacy for  medication refills 3-7 days in advance of running out of medications Attend church or other social activities Perform all self care activities independently  Perform IADL's (shopping, preparing meals, housekeeping, managing finances) independently Call provider office for new concerns or questions  Work with the social worker to address care coordination needs and will continue to work with the clinical team to address health care and disease management related needs call the Suicide and Crisis Lifeline: 988 call the [place] National Suicide Prevention Lifeline: 365-294-9252 or TTY: 936-137-9222 TTY (912) 561-0390) to talk to a trained counselor call 1-800-273-TALK (toll free, 24 hour hotline) if experiencing a Mental Health or [place] follow up appointment with care management team member scheduled for:  09-05-2021 at 230 pm  [place], MSN, [facility] Mobile: [phone]

## 2021-06-27 NOTE — Patient Instructions (Signed)
Visit Information  Thank you for taking time to visit with me today. Please don't hesitate to contact me if I can be of assistance to you before our next scheduled telephone appointment.  Following are the goals we discussed today:  RNCM Clinical Goal(s):  Patient will verbalize basic understanding of Anxiety, Hypothyroidism, and PD, neck pain and limited mobility disease process and self health management plan as evidenced by stable conditions, calling the office for questions and concerns, and working with the CCM team to optimize health and well being take all medications exactly as prescribed and will call provider for medication related questions as evidenced by compliance with medication and calling for refills before running out of medications     attend all scheduled medical appointments: saw pcp on 06-01-2021. Upcoming appointment with the pcp on 07-07-2021 and sees Orthopedic provider on 06-29-2021 as evidenced by keeping appointments and calling the office for schedule changes needs        demonstrate improved and ongoing health management independence as evidenced by stable conditions, stable VS, no exacerbations of PD, neck pain or hypothyroidism, stable mental health        demonstrate ongoing self health care management ability for effective management of chronic conditions  as evidenced by working with the CCM team through collaboration with Consulting civil engineer, provider, and care team.    Interventions: 1:1 collaboration with primary care provider regarding development and update of comprehensive plan of care as evidenced by provider attestation and co-signature Inter-disciplinary care team collaboration (see longitudinal plan of care) Evaluation of current treatment plan related to  self management and patient's adherence to plan as established by provider     Anxiety  (Status: Goal on Track (progressing): YES.) Long Term Goal  Evaluation of current treatment plan related to Anxiety,  Mental Health Concerns  self-management and patient's adherence to plan as established by provider. 06-27-2021: The patient had a fall on 05-28-2021 and was at [facility]. He had a laceration to his head. CT was unremarkable. The patient states that he was adjusting to his bifocals and the combination of neck pain he went down. He broke his glasses and a lady called EMS. He was followed up in the office by the pcp on 06-01-2021. The patient states the laceration has healed and he is doing well. The patient states that he does want to get his thyroid level and testosterone level checked when he comes in to see the pcp on 07-07-2021. Will let pcp know of the patients wishes.  Discussed plans with patient for ongoing care management follow up and provided patient with direct contact information for care management team Advised patient to call the office for changes in mood, anxiety, or depression ; Provided education to patient re: working with the CCM team to effectively manage anxiety and mental health concerns; Reviewed medications with patient and discussed compliance. 06-27-2021: The patient is compliant with medications ; Discussed plans with patient for ongoing care management follow up and provided patient with direct contact information for care management team; Advised patient to discuss questions or concerns about mental health and anxiety with provider; Screening for signs and symptoms of depression related to chronic disease state;  Assessed social determinant of health barriers;       Acute onset of Diarrhea   (Status: Goal Met.) Short Term Goal 06-27-2021: Closing this goal as this goal has been met Evaluation of current treatment plan related to  diarrhea ,  self-management and patient's adherence to plan  as established by provider. 05-02-2021: The patient states that the diarrhea has resolved now but he is still keeping his appointment with the GI specialist in February. The patient feels like he picked  up a bacteria from an all male whirlpool that he used back in December. He thought about it and feels certain this is what happened. He saw the pcp once in December with an ER visit on 04-19-2021 and 04-24-2021 and a recent visit to the pcp on 04-27-2021.  Discussed plans with patient for ongoing care management follow up and provided patient with direct contact information for care management team Advised patient to call the office for new onset of diarrhea, to keep upcoming appointment with GI provider and to be safe in public access areas such as whirlpools and other areas where bacteria can be transmitted. ; Provided education to patient re: hygiene, monitoring for sx and sx of infection and monitoring for changes in bowel habits; Reviewed scheduled/upcoming provider appointments including 06-08-2021 at 230 pm; Discussed plans with patient for ongoing care management follow up and provided patient with direct contact information for care management team;    Hypothyroidism   (Status: Goal on Track (progressing): YES.) Long Term Goal  Evaluation of current treatment plan related to Hypothyroidism,  self-management and patient's adherence to plan as established by provider. 06-27-2021: The patient states he is doing well. He has had his thyroid removed a few years ago and he wants to get his level checked at his upcoming pcp appointment. Denies any new concerns with hypothyroidism Discussed plans with patient for ongoing care management follow up and provided patient with direct contact information for care management team Advised patient to call the office for changes in his hypothyroidism, questions or concerns; Reviewed medications with patient and discussed compliance. 06-27-2021: The patient is compliant with medications; Discussed plans with patient for ongoing care management follow up and provided patient with direct contact information for care management team; Advised patient to discuss new concerns  about his thyroid health with provider;      Neck Pain with limited Mobility  (Status: Goal on Track (progressing): YES.) Long Term Goal  Pain assessment performed. 05-02-2021: the patient denies any pain today but states that the physical therapy has really helped. The patient needs a new referral sent to [place] for PT to continue. In basket message sent to the pcp and CMA. 06-27-2021: The patient states that his pain when he gets up in the mornings is "off the charts". Once he gets to moving around it is a lot better and he can functions better. He is taking PT at the gym he goes to and is going to the orthopedic provider on 06-29-2021 for evaluation and treatment options.  Medications reviewed. 06-27-2021: States compliance with medications  Reviewed provider established plan for pain management; Discussed importance of adherence to all scheduled medical appointments; Counseled on the importance of reporting any/all new or changed pain symptoms or management strategies to pain management provider; Advised patient to report to care team affect of pain on daily activities; Discussed use of relaxation techniques and/or diversional activities to assist with pain reduction (distraction, imagery, relaxation, massage, acupressure, TENS, heat, and cold application; Reviewed with patient prescribed pharmacological and nonpharmacological pain relief strategies; Advised patient to discuss unresolved pain, changes in level or intensity of pain  with provider; 05-02-2021: The patient states that he has changes insurance provider and where he was getting physical therapy his new insurance will not cover that provider.  He has an appointment with Waldemar Dickens on 05-22-2021 and needs a referral/authorization sent to [facility] from the pcp before services can begin. RNCM called [facility] and obtained the fax number. The number is [phone]. In basket message sent to pcp and CMA asking for assistance with referral to  [facility]. The pcp has placed a referral to [facility] and the patient was called back and notified of referral being placed. Denies any other concerns at this time. Will continue to monitor.    PD  (Status: Goal on Track (progressing): YES.) Long Term Goal  Evaluation of current treatment plan related to  PD ,  self-management and patient's adherence to plan as established by provider. 06-27-2021: Denies any new issues with PD. The patient is doing well and states he is compliant with the plan of care. Discussed plans with patient for ongoing care management follow up and provided patient with direct contact information for care management team Advised patient to call the office for changes in condition, questions or concerns; Provided education to patient re: PD and effective management of PD ; Reviewed medications with patient and discussed compliance. 06-27-2021: Is compliant with the plan of care and medications regimen for PD ; Discussed plans with patient for ongoing care management follow up and provided patient with direct contact information for care management team; Advised patient to discuss new concerns about PD progression, questions, or concerns with provider; Screening for signs and symptoms of depression related to chronic disease state;  Assessed social determinant of health barriers;      Falls:  (Status: New goal. Goal on Track (progressing): YES.) Long Term Goal  Provided written and verbal education re: potential causes of falls and Fall prevention strategies Reviewed medications and discussed potential side effects of medications such as dizziness and frequent urination Advised patient of importance of notifying provider of falls. 06-27-2021: The patient was evaluated in the ER for a fall on 05-28-2021 and followed up with the pcp on 06-01-2021. Received a laceration that has now healed.  Assessed for signs and symptoms of orthostatic hypotension Assessed for falls since last  encounter. Last fall noted per the patient was 05-28-2021. The patient states that he fell when he was coming out of [employer] and it was a combination of his new biofocals and his neck pain. He states that he has not had any new falls. Will continue to monitor for changes.  Assessed patients knowledge of fall risk prevention secondary to previously provided education Provided patient information for fall alert systems Assessed working status of life alert bracelet and patient adherence Advised patient to discuss new falls, changes in condition related to fall in February, and new concerns with provider Screening for signs and symptoms of depression related to chronic disease state Assessed social determinant of health barriers    Patient Goals/Self-Care Activities: Take medications as prescribed   Attend all scheduled provider appointments Call pharmacy for medication refills 3-7 days in advance of running out of medications Attend church or other social activities Perform all self care activities independently  Perform IADL's (shopping, preparing meals, housekeeping, managing finances) independently Call provider office for new concerns or questions  Work with the social worker to address care coordination needs and will continue to work with the clinical team to address health care and disease management related needs call the Suicide and Crisis Lifeline: 988 call the [place] National Suicide Prevention Lifeline: 210-809-5446 or TTY: 706-313-7424 TTY (857) 506-7862) to talk to a trained counselor call  1-800-273-TALK (toll free, 24 hour hotline) if experiencing a Mental Health or [place] next appointment is by telephone on 09-05-2021 at 230 pm  Please call the care guide team at [phone] if you need to cancel or reschedule your appointment.   If you are experiencing a Mental Health or [place] or need someone to talk to, please call the Suicide and  Crisis Lifeline: 988 call the [place] National Suicide Prevention Lifeline: 912-117-8613 or TTY: 725-491-3329 TTY 773-329-4915) to talk to a trained counselor call 1-800-273-TALK (toll free, 24 hour hotline)   Patient verbalizes understanding of instructions and care plan provided today and agrees to view in [place]. Active MyChart status confirmed with patient.    Noreene Larsson RN, MSN, [place] Mobile: [phone]

## 2021-06-30 DIAGNOSIS — M542 Cervicalgia: Secondary | ICD-10-CM | POA: Diagnosis not present

## 2021-07-07 ENCOUNTER — Ambulatory Visit (INDEPENDENT_AMBULATORY_CARE_PROVIDER_SITE_OTHER): Payer: Medicare HMO | Admitting: Internal Medicine

## 2021-07-07 ENCOUNTER — Other Ambulatory Visit: Payer: Self-pay

## 2021-07-07 ENCOUNTER — Encounter: Payer: Self-pay | Admitting: Internal Medicine

## 2021-07-07 VITALS — BP 136/72 | HR 84 | Temp 97.1°F | Ht 69.0 in | Wt 169.0 lb

## 2021-07-07 DIAGNOSIS — Z125 Encounter for screening for malignant neoplasm of prostate: Secondary | ICD-10-CM | POA: Diagnosis not present

## 2021-07-07 DIAGNOSIS — E349 Endocrine disorder, unspecified: Secondary | ICD-10-CM | POA: Diagnosis not present

## 2021-07-07 DIAGNOSIS — Z0001 Encounter for general adult medical examination with abnormal findings: Secondary | ICD-10-CM | POA: Diagnosis not present

## 2021-07-07 DIAGNOSIS — E890 Postprocedural hypothyroidism: Secondary | ICD-10-CM | POA: Diagnosis not present

## 2021-07-07 DIAGNOSIS — R7309 Other abnormal glucose: Secondary | ICD-10-CM | POA: Diagnosis not present

## 2021-07-07 DIAGNOSIS — Z1159 Encounter for screening for other viral diseases: Secondary | ICD-10-CM | POA: Diagnosis not present

## 2021-07-07 MED ORDER — LEVOCETIRIZINE DIHYDROCHLORIDE 5 MG PO TABS: 5.0000 mg | ORAL_TABLET | Freq: Every evening | ORAL | 1 refills | Status: DC

## 2021-07-07 MED ORDER — OLOPATADINE HCL 0.2 % OP SOLN: 1.0000 [drp] | Freq: Every day | OPHTHALMIC | 1 refills | Status: AC

## 2021-07-07 NOTE — Patient Instructions (Signed)
Health Maintenance After Age 73 After age 73, you are at a higher risk for certain long-term diseases and infections as well as injuries from falls. Falls are a major cause of broken bones and head injuries in people who are older than age 73. Getting regular preventive care can help to keep you healthy and well. Preventive care includes getting regular testing and making lifestyle changes as recommended by your health care provider. Talk with your health care provider about: Which screenings and tests you should have. A screening is a test that checks for a disease when you have no symptoms. A diet and exercise plan that is right for you. What should I know about screenings and tests to prevent falls? Screening and testing are the best ways to find a health problem early. Early diagnosis and treatment give you the best chance of managing medical conditions that are common after age 73. Certain conditions and lifestyle choices may make you more likely to have a fall. Your health care provider may recommend: Regular vision checks. Poor vision and conditions such as cataracts can make you more likely to have a fall. If you wear glasses, make sure to get your prescription updated if your vision changes. Medicine review. Work with your health care provider to regularly review all of the medicines you are taking, including over-the-counter medicines. Ask your health care provider about any side effects that may make you more likely to have a fall. Tell your health care provider if any medicines that you take make you feel dizzy or sleepy. Strength and balance checks. Your health care provider may recommend certain tests to check your strength and balance while standing, walking, or changing positions. Foot health exam. Foot pain and numbness, as well as not wearing proper footwear, can make you more likely to have a fall. Screenings, including: Osteoporosis screening. Osteoporosis is a condition that causes  the bones to get weaker and break more easily. Blood pressure screening. Blood pressure changes and medicines to control blood pressure can make you feel dizzy. Depression screening. You may be more likely to have a fall if you have a fear of falling, feel depressed, or feel unable to do activities that you used to do. Alcohol use screening. Using too much alcohol can affect your balance and may make you more likely to have a fall. Follow these instructions at home: Lifestyle Do not drink alcohol if: Your health care provider tells you not to drink. If you drink alcohol: Limit how much you have to: 0-1 drink a day for women. 0-2 drinks a day for men. Know how much alcohol is in your drink. In the [place] one drink equals one 12 oz bottle of beer (355 mL), one 5 oz glass of wine (148 mL), or one 1 oz glass of hard liquor (44 mL). Do not use any products that contain nicotine or tobacco. These products include cigarettes, chewing tobacco, and vaping devices, such as e-cigarettes. If you need help quitting, ask your health care provider. Activity  Follow a regular exercise program to stay fit. This will help you maintain your balance. Ask your health care provider what types of exercise are appropriate for you. If you need a cane or walker, use it as recommended by your health care provider. Wear supportive shoes that have nonskid soles. Safety  Remove any tripping hazards, such as rugs, cords, and clutter. Install safety equipment such as grab bars in bathrooms and safety rails on stairs. Keep rooms and walkways   well-lit. General instructions Talk with your health care provider about your risks for falling. Tell your health care provider if: You fall. Be sure to tell your health care provider about all falls, even ones that seem minor. You feel dizzy, tiredness (fatigue), or off-balance. Take over-the-counter and prescription medicines only as told by your health care provider. These include  supplements. Eat a healthy diet and maintain a healthy weight. A healthy diet includes low-fat dairy products, low-fat (lean) meats, and fiber from whole grains, beans, and lots of fruits and vegetables. Stay current with your vaccines. Schedule regular health, dental, and eye exams. Summary Having a healthy lifestyle and getting preventive care can help to protect your health and wellness after age 73. Screening and testing are the best way to find a health problem early and help you avoid having a fall. Early diagnosis and treatment give you the best chance for managing medical conditions that are more common for people who are older than age 73. Falls are a major cause of broken bones and head injuries in people who are older than age 73. Take precautions to prevent a fall at home. Work with your health care provider to learn what changes you can make to improve your health and wellness and to prevent falls. This information is not intended to replace advice given to you by your health care provider. Make sure you discuss any questions you have with your health care provider. Document Revised: 08/30/2020 Document Reviewed: 08/30/2020 Elsevier Patient Education  2022 [employer]  
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
## 2021-07-08 LAB — HEMOGLOBIN A1C
Hgb A1c MFr Bld: 5.7 % of total Hgb — ABNORMAL HIGH (ref <5.7)
Mean Plasma Glucose: 117 mg/dL
eAG (mmol/L): 6.5 mmol/L

## 2021-07-08 LAB — COMPLETE METABOLIC PANEL WITH GFR
AG Ratio: 1.5 (calc) (ref 1.0–2.5)
ALT: 21 U/L (ref 9–46)
AST: 11 U/L (ref 10–35)
Albumin: 4 g/dL (ref 3.6–5.1)
Alkaline phosphatase (APISO): 91 U/L (ref 35–144)
BUN: 21 mg/dL (ref 7–25)
CO2: 28 mmol/L (ref 20–32)
Calcium: 9 mg/dL (ref 8.6–10.3)
Chloride: 107 mmol/L (ref 98–110)
Creat: 1.02 mg/dL (ref 0.70–1.28)
Globulin: 2.6 g/dL (calc) (ref 1.9–3.7)
Glucose, Bld: 83 mg/dL (ref 65–99)
Potassium: 4.3 mmol/L (ref 3.5–5.3)
Sodium: 142 mmol/L (ref 135–146)
Total Bilirubin: 0.2 mg/dL (ref 0.2–1.2)
Total Protein: 6.6 g/dL (ref 6.1–8.1)
eGFR: 78 mL/min/{1.73_m2} (ref >=60)

## 2021-07-08 LAB — CBC
HCT: 41.2 % (ref 38.5–50.0)
Hemoglobin: 13.6 g/dL (ref 13.2–17.1)
MCH: 31 pg (ref 27.0–33.0)
MCHC: 33 g/dL (ref 32.0–36.0)
MCV: 93.8 fL (ref 80.0–100.0)
MPV: 9.3 fL (ref 7.5–12.5)
Platelets: 330 10*3/uL (ref 140–400)
RBC: 4.39 10*6/uL (ref 4.20–5.80)
RDW: 13.3 % (ref 11.0–15.0)
WBC: 6.2 10*3/uL (ref 3.8–10.8)

## 2021-07-08 LAB — LIPID PANEL
Cholesterol: 165 mg/dL (ref <200)
HDL: 42 mg/dL (ref >=40)
LDL Cholesterol (Calc): 109 mg/dL (calc) — ABNORMAL HIGH
Non-HDL Cholesterol (Calc): 123 mg/dL (calc) (ref <130)
Total CHOL/HDL Ratio: 3.9 (calc) (ref <5.0)
Triglycerides: 54 mg/dL (ref <150)

## 2021-07-08 LAB — HEPATITIS C ANTIBODY
Hepatitis C Ab: NONREACTIVE
SIGNAL TO CUT-OFF: 0.1 (ref <1.00)

## 2021-07-08 LAB — TESTOSTERONE: Testosterone: 261 ng/dL (ref 250–827)

## 2021-07-08 LAB — PSA: PSA: 7.73 ng/mL — ABNORMAL HIGH (ref <=4.00)

## 2021-07-08 LAB — TSH: TSH: 1.04 mIU/L (ref 0.40–4.50)

## 2021-07-11 DIAGNOSIS — E039 Hypothyroidism, unspecified: Secondary | ICD-10-CM | POA: Diagnosis not present

## 2021-07-11 DIAGNOSIS — E119 Type 2 diabetes mellitus without complications: Secondary | ICD-10-CM | POA: Diagnosis not present

## 2021-07-11 DIAGNOSIS — K219 Gastro-esophageal reflux disease without esophagitis: Secondary | ICD-10-CM | POA: Diagnosis not present

## 2021-07-13 DIAGNOSIS — E291 Testicular hypofunction: Secondary | ICD-10-CM | POA: Diagnosis not present

## 2021-07-14 DIAGNOSIS — M542 Cervicalgia: Secondary | ICD-10-CM | POA: Diagnosis not present

## 2021-07-15 ENCOUNTER — Ambulatory Visit: Payer: Self-pay

## 2021-07-15 NOTE — Telephone Encounter (Signed)
?  Chief Complaint: Needs more information stopping diabetes progression ?Symptoms:  ?Frequency:  ?Pertinent Negatives: Patient denies  ?Disposition: '[]'$ ED /'[]'$ Urgent Care (no appt availability in office) / '[]'$ Appointment(In office/virtual)/ '[]'$  [place] [facility]/ '[]'$ Home Care/ '[]'$ Refused Recommended Disposition /'[]'$ [place] [facility]/ '[x]'$  Follow-up with PCP ?Additional Notes: Pt has had history of hypoglycemia and is now prediabetic. Pt wants to stop progression to diabetes. ? ? ? ? ?Summary: DB concerns  ? The patient has been recently notified that they are potentially pre-diabetic after testing via [employer]  ? ?The patient was previously told that they were Hypoglycemic  ? ?The patient would like to discuss these concerns further with a nurse when possible  ? ?The patient declined to schedule an appt with their PCP but shares that they are experiencing no concerning symptoms  ? ?Please contact further when possible   ?  ? ?Reason for Disposition ? Requesting regular office appointment ? ?Answer Assessment - Initial Assessment Questions ?1. REASON FOR CALL or QUESTION: "What is your reason for calling today?" or "How can I best help you?" or "What question do you have that I can help answer?" ?    Prediabetes questions. Wants help stopping progression to diabetes. ? ?Protocols used: Information Only Call - No Triage-A-AH ? ?

## 2021-07-22 DIAGNOSIS — E039 Hypothyroidism, unspecified: Secondary | ICD-10-CM

## 2021-07-22 DIAGNOSIS — E890 Postprocedural hypothyroidism: Secondary | ICD-10-CM

## 2021-07-26 ENCOUNTER — Encounter: Payer: Self-pay | Admitting: Internal Medicine

## 2021-07-26 ENCOUNTER — Ambulatory Visit (INDEPENDENT_AMBULATORY_CARE_PROVIDER_SITE_OTHER): Payer: Medicare HMO | Admitting: Internal Medicine

## 2021-07-26 DIAGNOSIS — R7303 Prediabetes: Secondary | ICD-10-CM
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
## 2021-07-26 NOTE — Patient Instructions (Signed)

## 2021-07-26 NOTE — Assessment & Plan Note (Signed)
Encouraged low carb diet and continued exercise ?He wonders if he needs to start checking his blood sugars and I do not think he needs to check sugars at this time ?

## 2021-08-08 ENCOUNTER — Ambulatory Visit: Payer: Self-pay | Admitting: *Deleted

## 2021-08-08 NOTE — Telephone Encounter (Signed)
All those types of medications will cause similar side effects, changing the medication will likely make a difference.  His symptoms seem more consistent with irritable bowel syndrome.  And I have never heard of omeprazole causing ED.  He may want to make an appointment to discuss these issues. ?

## 2021-08-08 NOTE — Telephone Encounter (Signed)
Summary: side effects with omeprazole (PRILOSEC) 40 MG capsule  ? Patient called wanted to inform Ms Garnette Gunner that he is having side effects from the  omeprazole (PRILOSEC) 40 MG capsule make him feel bloated all the time and causes issues with ED and other issues. Per patient he have been taking this medication for 3 years and think its time to change. Ph#  [phone]   ?  ? ?Chief Complaint: Side effects of Omeprazole ?Symptoms: Bloating, loose stools alternating with constipation, ED ?Frequency: Dec. 2022 ?Pertinent Negatives: Patient denies  ?Disposition: '[]'$ ED /'[]'$ Urgent Care (no appt availability in office) / '[]'$ Appointment(In office/virtual)/ '[]'$  [place] [facility]/ '[]'$ Home Care/ '[]'$ Refused Recommended Disposition /'[]'$ [place] [facility]/ '[x]'$  Follow-up with PCP ?Additional Notes:  States he looked up med and states side effects could occur after taking for 3 years. Questioning if there is an alternative med. States stopped taking Omeprazole for a few days and felt awful. Please advise ?Reason for Disposition ? Caller wants to use a complementary or alternative medicine ? ?Answer Assessment - Initial Assessment Questions ?1. NAME of MEDICATION: "What medicine are you calling about?" ?    omeprazole ?2. QUESTION: "What is your question?" (e.g., double dose of medicine, side effect) ?    "Time to change to something else."  ?3. PRESCRIBING HCP: "Who prescribed it?" Reason: if prescribed by specialist, call should be referred to that group. ?     ?4. SYMPTOMS: "Do you have any symptoms?" ?    Bloating, loose stools alternating with constipation, ED ? ?Protocols used: Medication Question Call-A-AH ? ?

## 2021-08-09 ENCOUNTER — Encounter: Payer: Self-pay | Admitting: Internal Medicine

## 2021-08-09 ENCOUNTER — Telehealth (INDEPENDENT_AMBULATORY_CARE_PROVIDER_SITE_OTHER): Payer: Medicare HMO | Admitting: Internal Medicine

## 2021-08-09 DIAGNOSIS — K219 Gastro-esophageal reflux disease without esophagitis: Secondary | ICD-10-CM

## 2021-08-09 MED ORDER — FAMOTIDINE 20 MG PO TABS: 20.0000 mg | ORAL_TABLET | Freq: Every day | ORAL | 1 refills | Status: DC

## 2021-08-09 NOTE — Patient Instructions (Signed)

## 2021-08-09 NOTE — Telephone Encounter (Signed)
Virtual visit schedule for 08/09/2021 at 11:20am ? ?Thanks,  ? ?-Mickel Baas  ?

## 2021-08-09 NOTE — Assessment & Plan Note (Signed)
Concerned about PPI use ?Will d/c Omeprazole ?RX for Famotidine 20 mg daily ?
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
## 2021-08-11 DIAGNOSIS — M542 Cervicalgia: Secondary | ICD-10-CM | POA: Diagnosis not present

## 2021-08-21 DIAGNOSIS — K219 Gastro-esophageal reflux disease without esophagitis: Secondary | ICD-10-CM | POA: Diagnosis not present

## 2021-08-21 DIAGNOSIS — E119 Type 2 diabetes mellitus without complications: Secondary | ICD-10-CM | POA: Diagnosis not present

## 2021-08-21 DIAGNOSIS — E039 Hypothyroidism, unspecified: Secondary | ICD-10-CM | POA: Diagnosis not present

## 2021-08-25 DIAGNOSIS — M542 Cervicalgia: Secondary | ICD-10-CM | POA: Diagnosis not present

## 2021-09-05 ENCOUNTER — Ambulatory Visit (INDEPENDENT_AMBULATORY_CARE_PROVIDER_SITE_OTHER): Payer: Medicare HMO

## 2021-09-05 ENCOUNTER — Telehealth: Payer: Medicare HMO

## 2021-09-05 DIAGNOSIS — G250 Essential tremor: Secondary | ICD-10-CM

## 2021-09-05 DIAGNOSIS — E039 Hypothyroidism, unspecified: Secondary | ICD-10-CM

## 2021-09-05 DIAGNOSIS — G20 Parkinson's disease: Secondary | ICD-10-CM

## 2021-09-05 DIAGNOSIS — S199XXA Unspecified injury of neck, initial encounter: Secondary | ICD-10-CM

## 2021-09-05 DIAGNOSIS — G8929 Other chronic pain: Secondary | ICD-10-CM

## 2021-09-05 NOTE — Patient Instructions (Signed)
Visit Information ? ?Thank you for taking time to visit with me today. Please don't hesitate to contact me if I can be of assistance to you before our next scheduled telephone appointment. ? ?Following are the goals we discussed today:  ?Conditions to be addressed/monitored:Anxiety, Pulmonary Disease, Hypothyroidism, and neck pain ?  ?Care Plan : RNCM: General Plan of Care (Adult) for Chronic Disease Management and Care Coordination Needs  ?Updates made by Vanita Ingles, RN since 09/05/2021 12:00 AM  ?   ?  ?Problem: RNCM: Development of plan of care for Chronic Disease Management (anxiety, PD, hypothyroidism, neck pain and limited mobility, diarrhea)    ?Priority: High  ?   ?  ?Long-Range Goal: RNCM: Effective Management  of plan of care for Chronic Disease Management (anxiety, PD, hypothyroidism, neck pain and limited mobility, diarrhea)    ?Start Date: 05/02/2021  ?Expected End Date: 05/02/2022  ?Priority: High  ?Note:   ?Current Barriers: 09-05-2021: The patient has met goals. Does not require regular outreaches. The patient knows to call the [facility] for new concerns or needs. Will continue to monitor for changes.  ?Knowledge Deficits related to plan of care for management of Anxiety with Excessive Worry, and neck pain and limited mobility, PD, hypothyroidism   ?Chronic Disease Management support and education needs related to Anxiety with Excessive Worry, and neck pain and limited mobility, PD, and hypothroidism ?  ?RNCM Clinical Goal(s):  ?Patient will verbalize basic understanding of Anxiety, Hypothyroidism, and PD, neck pain and limited mobility disease process and self health management plan as evidenced by stable conditions, calling the office for questions and concerns, and working with the CCM team to optimize health and well being ?take all medications exactly as prescribed and will call provider for medication related questions as evidenced by compliance with medication and calling for refills before running  out of medications     ?attend all scheduled medical appointments: saw pcp on 08-09-2021. Upcoming appointment with the pcp on 07-07-2021 and sees Orthopedic provider on 06-29-2021 as evidenced by keeping appointments and calling the office for schedule changes needs        ?demonstrate improved and ongoing health management independence as evidenced by stable conditions, stable VS, no exacerbations of PD, neck pain or hypothyroidism, stable mental health        ?demonstrate ongoing self health care management ability for effective management of chronic conditions  as evidenced by working with the CCM team through collaboration with Consulting civil engineer, provider, and care team.  ?  ?Interventions: ?1:1 collaboration with primary care provider regarding development and update of comprehensive plan of care as evidenced by provider attestation and co-signature ?Inter-disciplinary care team collaboration (see longitudinal plan of care) ?Evaluation of current treatment plan related to  self management and patient's adherence to plan as established by provider ?  ?  ?Anxiety  (Status: Goal Met.) Long Term Goal  ?Evaluation of current treatment plan related to Anxiety, Mental Health Concerns  self-management and patient's adherence to plan as established by provider. 06-27-2021: The patient had a fall on 05-28-2021 and was at [facility]. He had a laceration to his head. CT was unremarkable. The patient states that he was adjusting to his bifocals and the combination of neck pain he went down. He broke his glasses and a lady called EMS. He was followed up in the office by the pcp on 06-01-2021. The patient states the laceration has healed and he is doing well. The patient states that he does  want to get his thyroid level and testosterone level checked when he comes in to see the pcp on 07-07-2021. Will let pcp know of the patients wishes.  ?Discussed plans with patient for ongoing care management follow up and provided patient with direct  contact information for care management team ?Advised patient to call the office for changes in mood, anxiety, or depression ; ?Provided education to patient re: working with the CCM team to effectively manage anxiety and mental health concerns; ?Reviewed medications with patient and discussed compliance. 06-27-2021: The patient is compliant with medications ; ?Discussed plans with patient for ongoing care management follow up and provided patient with direct contact information for care management team; ?Advised patient to discuss questions or concerns about mental health and anxiety with provider; ?Screening for signs and symptoms of depression related to chronic disease state;  ?Assessed social determinant of health barriers;   ?  ?  ?Acute onset of Diarrhea   (Status: Goal Met.) Short Term Goal 06-27-2021: Closing this goal as this goal has been met ?Evaluation of current treatment plan related to  diarrhea ,  self-management and patient's adherence to plan as established by provider. 05-02-2021: The patient states that the diarrhea has resolved now but he is still keeping his appointment with the GI specialist in February. The patient feels like he picked up a bacteria from an all male whirlpool that he used back in December. He thought about it and feels certain this is what happened. He saw the pcp once in December with an ER visit on 04-19-2021 and 04-24-2021 and a recent visit to the pcp on 04-27-2021.  ?Discussed plans with patient for ongoing care management follow up and provided patient with direct contact information for care management team ?Advised patient to call the office for new onset of diarrhea, to keep upcoming appointment with GI provider and to be safe in public access areas such as whirlpools and other areas where bacteria can be transmitted. ; ?Provided education to patient re: hygiene, monitoring for sx and sx of infection and monitoring for changes in bowel habits; ?Reviewed scheduled/upcoming  provider appointments including 06-08-2021 at 230 pm; ?Discussed plans with patient for ongoing care management follow up and provided patient with direct contact information for care management team;  ?  ?Hypothyroidism   (Status: Goal Met.) Long Term Goal  ?Evaluation of current treatment plan related to Hypothyroidism,  self-management and patient's adherence to plan as established by provider. 06-27-2021: The patient states he is doing well. He has had his thyroid removed a few years ago and he wants to get his level checked at his upcoming pcp appointment. Denies any new concerns with hypothyroidism ?Discussed plans with patient for ongoing care management follow up and provided patient with direct contact information for care management team ?Advised patient to call the office for changes in his hypothyroidism, questions or concerns; ?Reviewed medications with patient and discussed compliance. 06-27-2021: The patient is compliant with medications; ?Discussed plans with patient for ongoing care management follow up and provided patient with direct contact information for care management team; ?Advised patient to discuss new concerns about his thyroid health with provider;  ?  ?  ?Neck Pain with limited Mobility  (Status: Goal Met.) Long Term Goal  ?Pain assessment performed. 05-02-2021: the patient denies any pain today but states that the physical therapy has really helped. The patient needs a new referral sent to [facility] for PT to continue. In basket message sent to the pcp and  CMA. 06-27-2021: The patient states that his pain when he gets up in the mornings is "off the charts". Once he gets to moving around it is a lot better and he can functions better. He is taking PT at the gym he goes to and is going to the orthopedic provider on 06-29-2021 for evaluation and treatment options.  ?Medications reviewed. 06-27-2021: States compliance with medications  ?Reviewed provider established plan for pain  management; ?Discussed importance of adherence to all scheduled medical appointments; ?Counseled on the importance of reporting any/all new or changed pain symptoms or management strategies to pain management provider; ?Advi

## 2021-09-05 NOTE — Chronic Care Management (AMB) (Signed)
?Chronic Care Management  ? ?CCM RN Visit Note ? ?09/05/2021 ?Name: Todd Green MRN: [id number] DOB: [birth year] ? ?Subjective: ?Todd Green is a 71 y.o. year old male who is a primary care patient of Jearld Fenton, NP. The care management team was consulted for assistance with disease management and care coordination needs.   ? ?Engaged with patient by telephone for follow up visit in response to provider referral for case management and/or care coordination services.  ? ?Consent to Services:  ?The patient was given information about Chronic Care Management services, agreed to services, and gave verbal consent prior to initiation of services.  Please see initial visit note for detailed documentation.  ? ?Patient agreed to services and verbal consent obtained.  ? ?Assessment: Review of patient past medical history, allergies, medications, health status, including review of consultants reports, laboratory and other test data, was performed as part of comprehensive evaluation and provision of chronic care management services.  ? ?SDOH (Social Determinants of Health) assessments and interventions performed:   ? ?CCM Care Plan ? ?Allergies  ?Allergen Reactions  ? Allegra [Fexofenadine] Other (See Comments)  ?  Redness in gums  ? Sulfa Antibiotics Rash  ? ? ?Outpatient Encounter Medications as of 09/05/2021  ?Medication Sig  ? carbidopa-levodopa (SINEMET IR) 25-100 MG tablet Take 1 tablet by mouth 3 (three) times daily.  ? emtricitabine-tenofovir (TRUVADA) 200-300 MG tablet Take 1 tablet by mouth daily.  ? famotidine (PEPCID) 20 MG tablet Take 1 tablet (20 mg total) by mouth at bedtime.  ? fluticasone (FLONASE) 50 MCG/ACT nasal spray USE 2 SPRAYS IN EACH NOSTRIL EVERY DAY  ? levocetirizine (XYZAL ALLERGY 24HR) 5 MG tablet Take 1 tablet (5 mg total) by mouth every evening.  ? levothyroxine (SYNTHROID) 137 MCG tablet TAKE 1 TABLET EVERY DAY BEFORE BREAKFAST (NEED MD APPOINTMENT)  ? Olopatadine HCl 0.2 % SOLN Apply 1  drop to eye daily.  ? tadalafil (CIALIS) 5 MG tablet Take 5 mg by mouth daily.  ? Testosterone 20.25 MG/1.25GM (1.62%) GEL Apply 1 application topically daily.  ? ?No facility-administered encounter medications on file as of 09/05/2021.  ? ? ?Patient Active Problem List  ? Diagnosis Date Noted  ? Prediabetes 07/26/2021  ? Chronic neck pain 01/10/2021  ? Parkinson disease ([place]) 06/08/2020  ? Acquired hypothyroidism 02/02/2017  ? Acid reflux 02/02/2017  ? ? ?Conditions to be addressed/monitored:Anxiety, Pulmonary Disease, Hypothyroidism, and neck pain ? ?Care Plan : RNCM: General Plan of Care (Adult) for Chronic Disease Management and Care Coordination Needs  ?Updates made by Vanita Ingles, RN since 09/05/2021 12:00 AM  ?  ? ?Problem: RNCM: Development of plan of care for Chronic Disease Management (anxiety, PD, hypothyroidism, neck pain and limited mobility, diarrhea)   ?Priority: High  ?  ? ?Long-Range Goal: RNCM: Effective Management  of plan of care for Chronic Disease Management (anxiety, PD, hypothyroidism, neck pain and limited mobility, diarrhea)   ?Start Date: 05/02/2021  ?Expected End Date: 05/02/2022  ?Priority: High  ?Note:   ?Current Barriers: 09-05-2021: The patient has met goals. Does not require regular outreaches. The patient knows to call the [facility] for new concerns or needs. Will continue to monitor for changes.  ?Knowledge Deficits related to plan of care for management of Anxiety with Excessive Worry, and neck pain and limited mobility, PD, hypothyroidism   ?Chronic Disease Management support and education needs related to Anxiety with Excessive Worry, and neck pain and limited mobility, PD, and hypothroidism ? ?RNCM Clinical Goal(s):  ?  Patient will verbalize basic understanding of Anxiety, Hypothyroidism, and PD, neck pain and limited mobility disease process and self health management plan as evidenced by stable conditions, calling the office for questions and concerns, and working with the CCM team to  optimize health and well being ?take all medications exactly as prescribed and will call provider for medication related questions as evidenced by compliance with medication and calling for refills before running out of medications     ?attend all scheduled medical appointments: saw pcp on 08-09-2021. Upcoming appointment with the pcp on 07-07-2021 and sees Orthopedic provider on 06-29-2021 as evidenced by keeping appointments and calling the office for schedule changes needs        ?demonstrate improved and ongoing health management independence as evidenced by stable conditions, stable VS, no exacerbations of PD, neck pain or hypothyroidism, stable mental health        ?demonstrate ongoing self health care management ability for effective management of chronic conditions  as evidenced by working with the CCM team through collaboration with Consulting civil engineer, provider, and care team.  ? ?Interventions: ?1:1 collaboration with primary care provider regarding development and update of comprehensive plan of care as evidenced by provider attestation and co-signature ?Inter-disciplinary care team collaboration (see longitudinal plan of care) ?Evaluation of current treatment plan related to  self management and patient's adherence to plan as established by provider ? ? ?Anxiety  (Status: Goal Met.) Long Term Goal  ?Evaluation of current treatment plan related to Anxiety, Mental Health Concerns  self-management and patient's adherence to plan as established by provider. 06-27-2021: The patient had a fall on 05-28-2021 and was at [name]. He had a laceration to his head. CT was unremarkable. The patient states that he was adjusting to his bifocals and the combination of neck pain he went down. He broke his glasses and a lady called EMS. He was followed up in the office by the pcp on 06-01-2021. The patient states the laceration has healed and he is doing well. The patient states that he does want to get his thyroid level and  testosterone level checked when he comes in to see the pcp on 07-07-2021. Will let pcp know of the patients wishes.  ?Discussed plans with patient for ongoing care management follow up and provided patient with direct contact information for care management team ?Advised patient to call the office for changes in mood, anxiety, or depression ; ?Provided education to patient re: working with the CCM team to effectively manage anxiety and mental health concerns; ?Reviewed medications with patient and discussed compliance. 06-27-2021: The patient is compliant with medications ; ?Discussed plans with patient for ongoing care management follow up and provided patient with direct contact information for care management team; ?Advised patient to discuss questions or concerns about mental health and anxiety with provider; ?Screening for signs and symptoms of depression related to chronic disease state;  ?Assessed social determinant of health barriers;   ? ? ?Acute onset of Diarrhea   (Status: Goal Met.) Short Term Goal 06-27-2021: Closing this goal as this goal has been met ?Evaluation of current treatment plan related to  diarrhea ,  self-management and patient's adherence to plan as established by provider. 05-02-2021: The patient states that the diarrhea has resolved now but he is still keeping his appointment with the GI specialist in February. The patient feels like he picked up a bacteria from an all male whirlpool that he used back in December. He thought about it and feels  certain this is what happened. He saw the pcp once in December with an ER visit on 04-19-2021 and 04-24-2021 and a recent visit to the pcp on 04-27-2021.  ?Discussed plans with patient for ongoing care management follow up and provided patient with direct contact information for care management team ?Advised patient to call the office for new onset of diarrhea, to keep upcoming appointment with GI provider and to be safe in public access areas such as  whirlpools and other areas where bacteria can be transmitted. ; ?Provided education to patient re: hygiene, monitoring for sx and sx of infection and monitoring for changes in bowel habits; ?Reviewed scheduled/upcomi

## 2021-09-15 DIAGNOSIS — M542 Cervicalgia: Secondary | ICD-10-CM | POA: Diagnosis not present

## 2021-09-21 DIAGNOSIS — E039 Hypothyroidism, unspecified: Secondary | ICD-10-CM

## 2021-09-22 ENCOUNTER — Ambulatory Visit: Payer: Self-pay

## 2021-09-22 NOTE — Telephone Encounter (Signed)
Pt requesting a continuous blood glucose meter. Pt stated he passed out last July and wants to keep a close eye on his sugar.  Pt stated he have receptionist a brand name of Aspen CBG monitors and did not hear anything back. Pt advised that insurance companies may not approve of device since he currently does not check blood sugars and is not on insulin or any oral diabetic medications. Pt stated that due to his tremors, a CBGM would be of benefit.

## 2021-09-22 NOTE — Telephone Encounter (Signed)
Summary: Pt requested to speak with a nurse.   Pt requested to speak with a nurse, and he mentioned he dropped off an Rx for a device to check bs. Pt stated he fainted last summer and wants to keep an eye on it . Pt mentioned he is Pre-diabetic and has Parkinson very mild tremor.   Pt insisted on speaking with a nurse and declined to provide further details.    Pt seeking clinical advise.      Answer Assessment - Initial Assessment Questions 1. REASON FOR CALL or QUESTION: "What is your reason for calling today?" or "How can I best help you?" or "What question do you have that I can help answer?"     Pt requesting a continuous blood glucose monitor. 2. CALLER: Document the source of call. (e.g., laboratory, patient).     patient  Protocols used: PCP Call - No Triage-A-AH

## 2021-09-22 NOTE — Telephone Encounter (Signed)
FYI...   I advised pt that his insurance will not cover any brand of continuous read glucose monitors.  I told him he could get [employer] for [employer] of $75 a month.  He declines at this time.    Thanks,   -Mickel Baas

## 2021-09-23 ENCOUNTER — Telehealth: Payer: Self-pay | Admitting: Internal Medicine

## 2021-09-23 NOTE — Telephone Encounter (Signed)
See previous notes, pt was advised that his insurance dose not cover a continuous glucose monitor.

## 2021-09-23 NOTE — Telephone Encounter (Signed)
noted 

## 2021-09-23 NOTE — Telephone Encounter (Signed)
Medication Refill - Medication: Accu Check Glucose Monitor  Has the patient contacted their pharmacy? Yes Contact PCP  *See Nurse Triage encounter on 09/22/21*  Preferred Pharmacy (with phone number or street name):  [facility] [address] [place], [place] [employer] AT [facility] [address] Phone:  [phone]  Fax:  [phone]     Has the patient been seen for an appointment in the last year OR does the patient have an upcoming appointment? Yes.    Agent: Please be advised that RX refills may take up to 3 business days. We ask that you follow-up with your pharmacy.

## 2021-09-29 DIAGNOSIS — M542 Cervicalgia: Secondary | ICD-10-CM | POA: Diagnosis not present

## 2021-10-04 ENCOUNTER — Ambulatory Visit: Payer: Medicare HMO

## 2021-10-07 ENCOUNTER — Ambulatory Visit (INDEPENDENT_AMBULATORY_CARE_PROVIDER_SITE_OTHER): Payer: Medicare HMO

## 2021-10-07 VITALS — BP 110/68 | Ht 69.0 in | Wt 164.0 lb

## 2021-10-07 DIAGNOSIS — Z0000 Encounter for general adult medical examination without abnormal findings: Secondary | ICD-10-CM | POA: Diagnosis not present
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
## 2021-10-07 NOTE — Patient Instructions (Addendum)
Todd Green , Thank you for taking time to come for your Medicare Wellness Visit. I appreciate your ongoing commitment to your health goals. Please review the following plan we discussed and let me know if I can assist you in the future.   Screening recommendations/referrals: Colonoscopy: Cologuard 06/21/19 Recommended yearly ophthalmology/optometry visit for glaucoma screening and checkup Recommended yearly dental visit for hygiene and checkup  Vaccinations: Influenza vaccine: 01/10/21 Pneumococcal vaccine: 09/11/14 Tdap vaccine: 02/03/19 Shingles vaccine: n/d   Covid-19: 06/24/19, 07/03/19, 07/25/19, 08/05/19, 12/25/19  Advanced directives: no  Conditions/risks identified: none  Next appointment: Follow up in one year for your annual wellness visit. - 10/13/22 @ 10:00 am in person  Preventive Care 65 Years and Older, Male Preventive care refers to lifestyle choices and visits with your health care provider that can promote health and wellness. What does preventive care include? A yearly physical exam. This is also called an annual well check. Dental exams once or twice a year. Routine eye exams. Ask your health care provider how often you should have your eyes checked. Personal lifestyle choices, including: Daily care of your teeth and gums. Regular physical activity. Eating a healthy diet. Avoiding tobacco and drug use. Limiting alcohol use. Practicing safe sex. Taking low doses of aspirin every day. Taking vitamin and mineral supplements as recommended by your health care provider. What happens during an annual well check? The services and screenings done by your health care provider during your annual well check will depend on your age, overall health, lifestyle risk factors, and family history of disease. Counseling  Your health care provider may ask you questions about your: Alcohol use. Tobacco use. Drug use. Emotional well-being. Home and relationship well-being. Sexual  activity. Eating habits. History of falls. Memory and ability to understand (cognition). Work and work Statistician. Screening  You may have the following tests or measurements: Height, weight, and BMI. Blood pressure. Lipid and cholesterol levels. These may be checked every 5 years, or more frequently if you are over 74 years old. Skin check. Lung cancer screening. You may have this screening every year starting at age 72 if you have a 30-pack-year history of smoking and currently smoke or have quit within the past 15 years. Fecal occult blood test (FOBT) of the stool. You may have this test every year starting at age 51. Flexible sigmoidoscopy or colonoscopy. You may have a sigmoidoscopy every 5 years or a colonoscopy every 10 years starting at age 23. Prostate cancer screening. Recommendations will vary depending on your family history and other risks. Hepatitis C blood test. Hepatitis B blood test. Sexually transmitted disease (STD) testing. Diabetes screening. This is done by checking your blood sugar (glucose) after you have not eaten for a while (fasting). You may have this done every 1-3 years. Abdominal aortic aneurysm (AAA) screening. You may need this if you are a current or former smoker. Osteoporosis. You may be screened starting at age 57 if you are at high risk. Talk with your health care provider about your test results, treatment options, and if necessary, the need for more tests. Vaccines  Your health care provider may recommend certain vaccines, such as: Influenza vaccine. This is recommended every year. Tetanus, diphtheria, and acellular pertussis (Tdap, Td) vaccine. You may need a Td booster every 10 years. Zoster vaccine. You may need this after age 6. Pneumococcal 13-valent conjugate (PCV13) vaccine. One dose is recommended after age 66. Pneumococcal polysaccharide (PPSV23) vaccine. One dose is recommended after age 37. Talk  to your health care provider about which  screenings and vaccines you need and how often you need them. This information is not intended to replace advice given to you by your health care provider. Make sure you discuss any questions you have with your health care provider. Document Released: 05/07/2015 Document Revised: 12/29/2015 Document Reviewed: 02/09/2015 Elsevier Interactive Patient Education  2017 [place] Prevention in the Home Falls can cause injuries. They can happen to people of all ages. There are many things you can do to make your home safe and to help prevent falls. What can I do on the outside of my home? Regularly fix the edges of walkways and driveways and fix any cracks. Remove anything that might make you trip as you walk through a door, such as a raised step or threshold. Trim any bushes or trees on the path to your home. Use bright outdoor lighting. Clear any walking paths of anything that might make someone trip, such as rocks or tools. Regularly check to see if handrails are loose or broken. Make sure that both sides of any steps have handrails. Any raised decks and porches should have guardrails on the edges. Have any leaves, snow, or ice cleared regularly. Use sand or salt on walking paths during winter. Clean up any spills in your garage right away. This includes oil or grease spills. What can I do in the bathroom? Use night lights. Install grab bars by the toilet and in the tub and shower. Do not use towel bars as grab bars. Use non-skid mats or decals in the tub or shower. If you need to sit down in the shower, use a plastic, non-slip stool. Keep the floor dry. Clean up any water that spills on the floor as soon as it happens. Remove soap buildup in the tub or shower regularly. Attach bath mats securely with double-sided non-slip rug tape. Do not have throw rugs and other things on the floor that can make you trip. What can I do in the bedroom? Use night lights. Make sure that you have a  light by your bed that is easy to reach. Do not use any sheets or blankets that are too big for your bed. They should not hang down onto the floor. Have a firm chair that has side arms. You can use this for support while you get dressed. Do not have throw rugs and other things on the floor that can make you trip. What can I do in the kitchen? Clean up any spills right away. Avoid walking on wet floors. Keep items that you use a lot in easy-to-reach places. If you need to reach something above you, use a strong step stool that has a grab bar. Keep electrical cords out of the way. Do not use floor polish or wax that makes floors slippery. If you must use wax, use non-skid floor wax. Do not have throw rugs and other things on the floor that can make you trip. What can I do with my stairs? Do not leave any items on the stairs. Make sure that there are handrails on both sides of the stairs and use them. Fix handrails that are broken or loose. Make sure that handrails are as long as the stairways. Check any carpeting to make sure that it is firmly attached to the stairs. Fix any carpet that is loose or worn. Avoid having throw rugs at the top or bottom of the stairs. If you do have throw rugs,  attach them to the floor with carpet tape. Make sure that you have a light switch at the top of the stairs and the bottom of the stairs. If you do not have them, ask someone to add them for you. What else can I do to help prevent falls? Wear shoes that: Do not have high heels. Have rubber bottoms. Are comfortable and fit you well. Are closed at the toe. Do not wear sandals. If you use a stepladder: Make sure that it is fully opened. Do not climb a closed stepladder. Make sure that both sides of the stepladder are locked into place. Ask someone to hold it for you, if possible. Clearly mark and make sure that you can see: Any grab bars or handrails. First and last steps. Where the edge of each step  is. Use tools that help you move around (mobility aids) if they are needed. These include: Canes. Walkers. Scooters. Crutches. Turn on the lights when you go into a dark area. Replace any light bulbs as soon as they burn out. Set up your furniture so you have a clear path. Avoid moving your furniture around. If any of your floors are uneven, fix them. If there are any pets around you, be aware of where they are. Review your medicines with your doctor. Some medicines can make you feel dizzy. This can increase your chance of falling. Ask your doctor what other things that you can do to help prevent falls. This information is not intended to replace advice given to you by your health care provider. Make sure you discuss any questions you have with your health care provider. Document Released: 02/04/2009 Document Revised: 09/16/2015 Document Reviewed: 05/15/2014 Elsevier Interactive Patient Education  2017 [employer].

## 2021-10-11 ENCOUNTER — Telehealth: Payer: Self-pay | Admitting: Internal Medicine

## 2021-10-11 NOTE — Telephone Encounter (Unsigned)
Copied from [place] [phone]. Topic: General - Other >> Oct 11, 2021  3:53 PM Everette C wrote: Reason for CRM: Medication Refill - Medication: triamcinolone (KENALOG) 0.025 % ointment [[id number]]   Has the patient contacted their pharmacy? Yes.  The patient has been directed to contact their PCP (Agent: If no, request that the patient contact the pharmacy for the refill. If patient does not wish to contact the pharmacy document the reason why and proceed with request.) (Agent: If yes, when and what did the pharmacy advise?)  Preferred Pharmacy (with phone number or street name): [place], [place] [name] AT [address] [place] [address] Phone: [phone] Fax: [phone] Hours: Not open 24 hours   Has the patient been seen for an appointment in the last year OR does the patient have an upcoming appointment? Yes.    Agent: Please be advised that RX refills may take up to 3 business days. We ask that you follow-up with your pharmacy.

## 2021-10-12 MED ORDER — TRIAMCINOLONE ACETONIDE 0.025 % EX OINT: 1.0000 | TOPICAL_OINTMENT | Freq: Two times a day (BID) | CUTANEOUS | 0 refills | Status: AC

## 2021-10-12 NOTE — Addendum Note (Signed)
Addended by: Jearld Fenton on: 10/12/2021 10:06 AM   Modules accepted: Orders

## 2021-10-12 NOTE — Telephone Encounter (Signed)
Pt requesting Triamcinolone 0.025% ointment. States been using, lasts a long time, just now out. Not on current med profile. Please review. Thank you.

## 2021-10-12 NOTE — Telephone Encounter (Signed)
Medication refilled

## 2021-10-20 ENCOUNTER — Encounter: Payer: Self-pay | Admitting: Gastroenterology

## 2021-10-20 ENCOUNTER — Ambulatory Visit (INDEPENDENT_AMBULATORY_CARE_PROVIDER_SITE_OTHER): Payer: Medicare HMO | Admitting: Gastroenterology

## 2021-10-20 VITALS — BP 120/78 | HR 79 | Temp 97.9°F | Ht 69.0 in | Wt 164.0 lb

## 2021-10-20 DIAGNOSIS — K219 Gastro-esophageal reflux disease without esophagitis: Secondary | ICD-10-CM | POA: Diagnosis not present

## 2021-10-20 DIAGNOSIS — R140 Abdominal distension (gaseous): Secondary | ICD-10-CM

## 2021-10-20 DIAGNOSIS — R6889 Other general symptoms and signs: Secondary | ICD-10-CM | POA: Diagnosis not present
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
## 2021-10-21 MED ORDER — OMEPRAZOLE 20 MG PO CPDR: 20.0000 mg | DELAYED_RELEASE_CAPSULE | Freq: Every day | ORAL | 0 refills | Status: DC

## 2021-10-21 NOTE — Addendum Note (Signed)
Addended by: Lurlean Nanny on: 10/21/2021 10:07 AM   Modules accepted: Orders

## 2021-10-27 DIAGNOSIS — G20 Parkinson's disease: Secondary | ICD-10-CM | POA: Diagnosis not present

## 2021-10-27 DIAGNOSIS — R6889 Other general symptoms and signs: Secondary | ICD-10-CM | POA: Diagnosis not present

## 2021-10-28 DIAGNOSIS — M542 Cervicalgia: Secondary | ICD-10-CM | POA: Diagnosis not present

## 2021-11-09 ENCOUNTER — Other Ambulatory Visit: Payer: Self-pay | Admitting: Internal Medicine

## 2021-11-09 ENCOUNTER — Other Ambulatory Visit: Payer: Self-pay | Admitting: Family Medicine

## 2021-11-09 DIAGNOSIS — J301 Allergic rhinitis due to pollen: Secondary | ICD-10-CM

## 2021-11-09 DIAGNOSIS — K449 Diaphragmatic hernia without obstruction or gangrene: Secondary | ICD-10-CM

## 2021-11-10 NOTE — Telephone Encounter (Signed)
Requested Prescriptions  Pending Prescriptions Disp Refills  . fluticasone (FLONASE) 50 MCG/ACT nasal spray [Pharmacy Med Name: FLUTICASONE PROPIONATE 50 MCG/ACT Suspension] 48 g 1    Sig: USE 2 SPRAYS IN EACH NOSTRIL EVERY DAY     Ear, Nose, and Throat: Nasal Preparations - Corticosteroids Passed - 11/09/2021 11:38 AM      Passed - Valid encounter within last 12 months    Recent Outpatient Visits          3 months ago Gastroesophageal reflux disease without esophagitis   [place], NP   3 months ago Prediabetes   [facility] [place], Coralie Keens, NP   4 months ago Encounter for general adult medical examination with abnormal findings   [name] [place], Coralie Keens, NP   5 months ago Minor head injury, subsequent encounter   [facility] [place], [place], NP   6 months ago Diarrhea of presumed infectious origin   [facility] [place], Coralie Keens, NP

## 2021-11-10 NOTE — Telephone Encounter (Signed)
Requested Prescriptions  Pending Prescriptions Disp Refills  . omeprazole (PRILOSEC) 40 MG capsule [Pharmacy Med Name: OMEPRAZOLE 40 MG Capsule Delayed Release] 90 capsule 0    Sig: TAKE 1 CAPSULE EVERY DAY     Gastroenterology: Proton Pump Inhibitors Passed - 11/09/2021 11:38 AM      Passed - Valid encounter within last 12 months    Recent Outpatient Visits          3 months ago Gastroesophageal reflux disease without esophagitis   [place], NP   3 months ago Prediabetes   [name] [place], Coralie Keens, NP   4 months ago Encounter for general adult medical examination with abnormal findings   [facility] [place], Coralie Keens, NP   5 months ago Minor head injury, subsequent encounter   [facility] [place], [place], NP   6 months ago Diarrhea of presumed infectious origin   [place], Coralie Keens, NP             Dosage changed.

## 2021-11-15 DIAGNOSIS — M542 Cervicalgia: Secondary | ICD-10-CM | POA: Diagnosis not present

## 2022-01-05 ENCOUNTER — Other Ambulatory Visit: Payer: Self-pay | Admitting: Family Medicine

## 2022-01-05 DIAGNOSIS — E890 Postprocedural hypothyroidism: Secondary | ICD-10-CM

## 2022-01-06 NOTE — Telephone Encounter (Signed)
Called to schedule an appt per the note with the Synthroid refill request.   Left a voicemail to call back and make an appt.   A 30 day courtesy supply given.

## 2022-01-28 ENCOUNTER — Other Ambulatory Visit: Payer: Self-pay | Admitting: Internal Medicine

## 2022-01-28 DIAGNOSIS — E890 Postprocedural hypothyroidism: Secondary | ICD-10-CM

## 2022-01-30 NOTE — Telephone Encounter (Signed)
Called pt to make appt and refused. Refusing prescription-  Requested Prescriptions  Pending Prescriptions Disp Refills   levothyroxine (SYNTHROID) 137 MCG tablet [Pharmacy Med Name: LEVOTHYROXINE SODIUM 137 MCG Tablet] 30 tablet 10    Sig: TAKE 1 TABLET EVERY DAY BEFORE BREAKFAST (NEED MD APPOINTMENT)     Endocrinology:  Hypothyroid Agents Passed - 01/28/2022  3:08 AM      Passed - TSH in normal range and within 360 days    TSH  Date Value Ref Range Status  07/07/2021 1.04 0.40 - 4.50 mIU/L Final         Passed - Valid encounter within last 12 months    Recent Outpatient Visits           5 months ago Gastroesophageal reflux disease without esophagitis   [place], NP   6 months ago Prediabetes   [facility] [place], Coralie Keens, NP   6 months ago Encounter for general adult medical examination with abnormal findings   [facility] [place], Coralie Keens, NP   8 months ago Minor head injury, subsequent encounter   [facility] [place], [place], NP   9 months ago Diarrhea of presumed infectious origin   [name] [place], Coralie Keens, NP

## 2022-02-25 ENCOUNTER — Other Ambulatory Visit: Payer: Self-pay | Admitting: Internal Medicine

## 2022-02-27 NOTE — Telephone Encounter (Signed)
Requested Prescriptions  Pending Prescriptions Disp Refills   famotidine (PEPCID) 20 MG tablet [Pharmacy Med Name: FAMOTIDINE 20 MG TAB] 90 tablet 1    Sig: TAKE ONE TABLET BY MOUTH AT BEDTIME     Gastroenterology:  H2 Antagonists Passed - 02/25/2022  2:39 AM      Passed - Valid encounter within last 12 months    Recent Outpatient Visits           6 months ago Gastroesophageal reflux disease without esophagitis   [facility] Baity, Regina W, NP   7 months ago Prediabetes   [facility] Baity, Regina W, NP   7 months ago Encounter for general adult medical examination with abnormal findings   [facility] Baity, Regina W, NP   9 months ago Minor head injury, subsequent encounter   [facility] Baity, Regina W, NP   10 months ago Diarrhea of presumed infectious origin   [facility] Baity, Regina W, NP               levocetirizine (XYZAL) 5 MG tablet [Pharmacy Med Name: LEVOCETIRIZINE 5 MG TAB[*]] 90 tablet 1    Sig: TAKE ONE TABLET BY MOUTH EVERY EVENING     Ear, Nose, and Throat:  Antihistamines - levocetirizine dihydrochloride Passed - 02/25/2022  2:39 AM      Passed - Cr in normal range and within 360 days    Creat  Date Value Ref Range Status  07/07/2021 1.02 0.70 - 1.28 mg/dL Final         Passed - eGFR is 10 or above and within 360 days    GFR, Est African American  Date Value Ref Range Status  06/23/2020 100 > OR = 60 mL/min/1.73m2 Final   GFR, Est Non African American  Date Value Ref Range Status  06/23/2020 86 > OR = 60 mL/min/1.73m2 Final   GFR, Estimated  Date Value Ref Range Status  04/19/2021 >60 >60 mL/min Final    Comment:    (NOTE) Calculated using the CKD-EPI Creatinine Equation (2021)    eGFR  Date Value Ref Range Status  07/07/2021 78 > OR = 60 mL/min/1.73m2 Final    Comment:    The eGFR is based on the CKD-EPI 2021 equation. To calculate  the new eGFR from a  previous Creatinine or Cystatin C result, go to https://www.kidney.org/professionals/ kdoqi/gfr%5Fcalculator          Passed - Valid encounter within last 12 months    Recent Outpatient Visits           6 months ago Gastroesophageal reflux disease without esophagitis   [facility] Baity, Regina W, NP   7 months ago Prediabetes   [facility] Baity, Regina W, NP   7 months ago Encounter for general adult medical examination with abnormal findings   [facility] Baity, Regina W, NP   9 months ago Minor head injury, subsequent encounter   [facility] Baity, Regina W, NP   10 months ago Diarrhea of presumed infectious origin   [facility] Baity, Regina W, NP                

## 2022-02-28 IMAGING — MR MR PROSTATE WO/W CM
56 series · 56 of 56 positions shown · IV contrast (gadavist)
Comparison: CT pelvis 07/23/2017

CLINICAL DATA: Enlarged prostate gland. Elevated PSA. Prior
UroLift.

EXAM:
MR PROSTATE WITHOUT AND WITH CONTRAST
TECHNIQUE: Multiplanar multisequence MRI images were obtained of the pelvis
centered about the prostate. Pre and post contrast images were
obtained.
CONTRAST:  7mL GADAVIST GADOBUTROL 1 MMOL/ML IV SOLN

[Series 3: ax in&out whole · axial · 3.0mm · 1.19mm/px · 1 of 88 slices shown (1 of 2)]
[im 1/88]
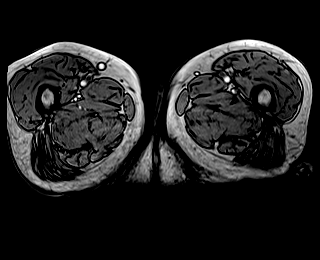

[Series 4: ax in&out whole · axial · 3.0mm · 1.19mm/px · 1 of 88 slices shown (2 of 2)]
[im 1/88]
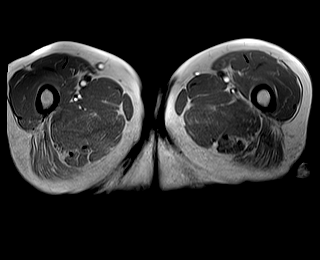

[Series 5: T2 · coronal · 3.0mm · 0.70mm/px · 1 of 35 slices shown (1 of 4)]
[im 1/35]
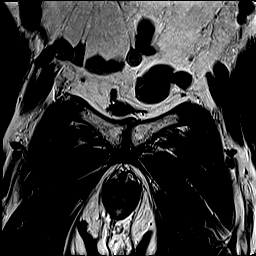

[Series 6: T2 · axial · 3.0mm · 0.56mm/px · 1 of 25 slices shown (2 of 4)]
[im 1/25]
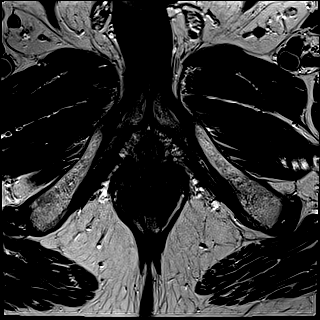

[Series 7: DWI · axial · 3.0mm · 0.86mm/px · 1 of 75 slices shown (1 of 3)]
[im 1/75]
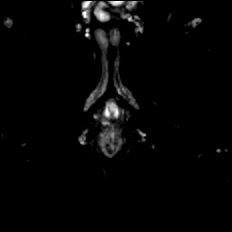

[Series 8: DWI · axial · 3.0mm · 0.86mm/px · 1 of 25 slices shown (2 of 3)]
[im 1/25]
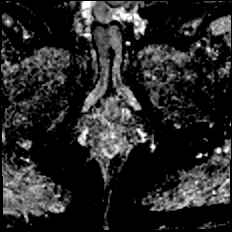

[Series 9: DWI · axial · 3.0mm · 0.86mm/px · 1 of 25 slices shown (3 of 3)]
[im 1/25]
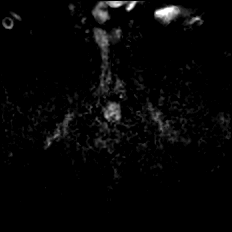

[Series 10: T2 · axial · 1.0mm · 1.04mm/px · 1 of 72 slices shown (3 of 4)]
[im 1/72]
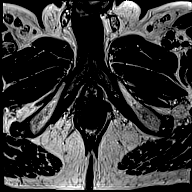

[Series 11: T2 · coronal · 3.0mm · 0.70mm/px · 1 of 35 slices shown (4 of 4)]
[im 1/35]
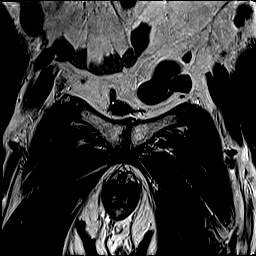

[Series 12: T1 · axial · 3.0mm · 1.15mm/px · 1 of 28 slices shown (1 of 47)]
[im 1/28]
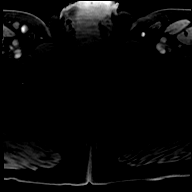

[Series 13: T1 · axial · 3.0mm · 1.15mm/px · 1 of 28 slices shown (2 of 47)]
[im 1/28]
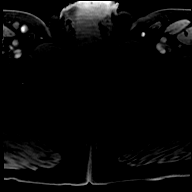

[Series 14: T1 · axial · 3.0mm · 1.15mm/px · 1 of 28 slices shown (3 of 47)]
[im 1/28]
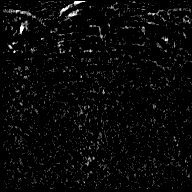

[Series 15: T1 · axial · 3.0mm · 1.15mm/px · 1 of 28 slices shown (4 of 47)]
[im 1/28]
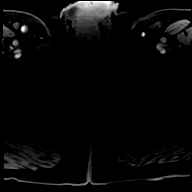

[Series 16: T1 · axial · 3.0mm · 1.15mm/px · 1 of 28 slices shown (5 of 47)]
[im 1/28]
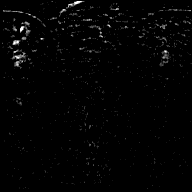

[Series 17: T1 · axial · 3.0mm · 1.15mm/px · 1 of 28 slices shown (6 of 47)]
[im 1/28]
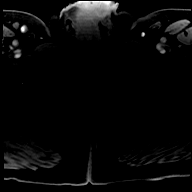

[Series 18: T1 · axial · 3.0mm · 1.15mm/px · 1 of 28 slices shown (7 of 47)]
[im 1/28]
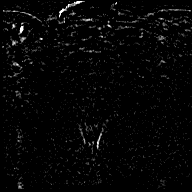

[Series 19: T1 · axial · 3.0mm · 1.15mm/px · 1 of 28 slices shown (8 of 47)]
[im 1/28]
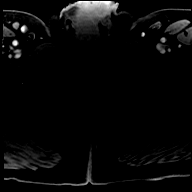

[Series 20: T1 · axial · 3.0mm · 1.15mm/px · 1 of 28 slices shown (9 of 47)]
[im 1/28]
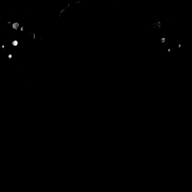

[Series 21: T1 · axial · 3.0mm · 1.15mm/px · 1 of 28 slices shown (10 of 47)]
[im 1/28]
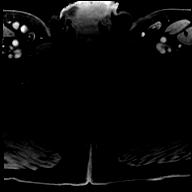

[Series 22: T1 · axial · 3.0mm · 1.15mm/px · 1 of 28 slices shown (11 of 47)]
[im 1/28]
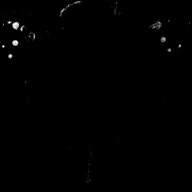

[Series 23: T1 · axial · 3.0mm · 1.15mm/px · 1 of 28 slices shown (12 of 47)]
[im 1/28]
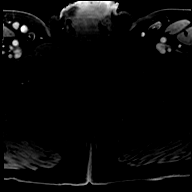

[Series 24: T1 · axial · 3.0mm · 1.15mm/px · 1 of 28 slices shown (13 of 47)]
[im 1/28]
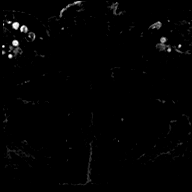

[Series 25: T1 · axial · 3.0mm · 1.15mm/px · 1 of 28 slices shown (14 of 47)]
[im 1/28]
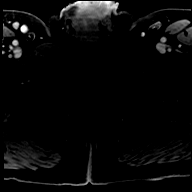

[Series 26: T1 · axial · 3.0mm · 1.15mm/px · 1 of 28 slices shown (15 of 47)]
[im 1/28]
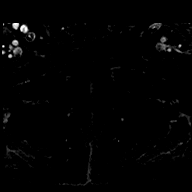

[Series 27: T1 · axial · 3.0mm · 1.15mm/px · 1 of 28 slices shown (16 of 47)]
[im 1/28]
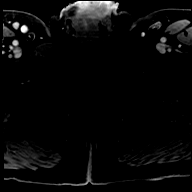

[Series 28: T1 · axial · 3.0mm · 1.15mm/px · 1 of 28 slices shown (17 of 47)]
[im 1/28]
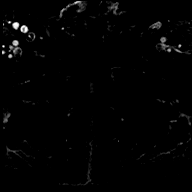

[Series 29: T1 · axial · 3.0mm · 1.15mm/px · 1 of 28 slices shown (18 of 47)]
[im 1/28]
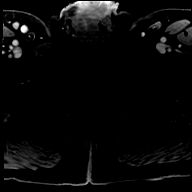

[Series 30: T1 · axial · 3.0mm · 1.15mm/px · 1 of 28 slices shown (19 of 47)]
[im 1/28]
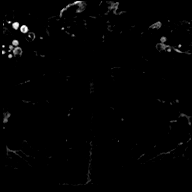

[Series 31: T1 · axial · 3.0mm · 1.15mm/px · 1 of 28 slices shown (20 of 47)]
[im 1/28]
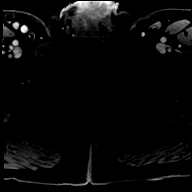

[Series 32: T1 · axial · 3.0mm · 1.15mm/px · 1 of 28 slices shown (21 of 47)]
[im 1/28]
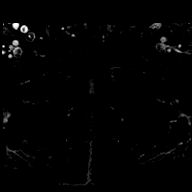

[Series 33: T1 · axial · 3.0mm · 1.15mm/px · 1 of 28 slices shown (22 of 47)]
[im 1/28]
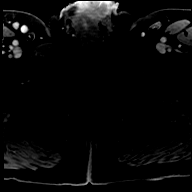

[Series 34: T1 · axial · 3.0mm · 1.15mm/px · 1 of 28 slices shown (23 of 47)]
[im 1/28]
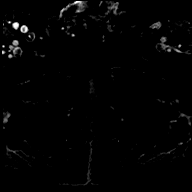

[Series 35: T1 · axial · 3.0mm · 1.15mm/px · 1 of 28 slices shown (24 of 47)]
[im 1/28]
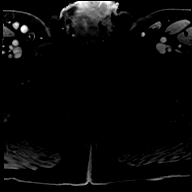

[Series 36: T1 · axial · 3.0mm · 1.15mm/px · 1 of 28 slices shown (25 of 47)]
[im 1/28]
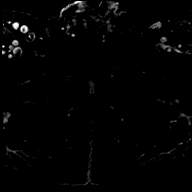

[Series 37: T1 · axial · 3.0mm · 1.15mm/px · 1 of 28 slices shown (26 of 47)]
[im 1/28]
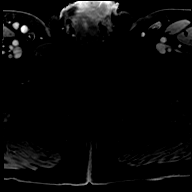

[Series 38: T1 · axial · 3.0mm · 1.15mm/px · 1 of 28 slices shown (27 of 47)]
[im 1/28]
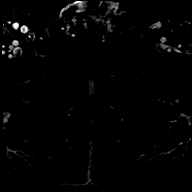

[Series 39: T1 · axial · 3.0mm · 1.15mm/px · 1 of 28 slices shown (28 of 47)]
[im 1/28]
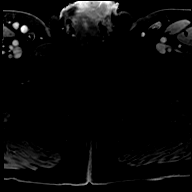

[Series 40: T1 · axial · 3.0mm · 1.15mm/px · 1 of 28 slices shown (29 of 47)]
[im 1/28]
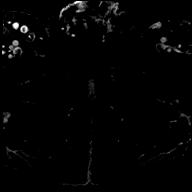

[Series 41: T1 · axial · 3.0mm · 1.15mm/px · 1 of 28 slices shown (30 of 47)]
[im 1/28]
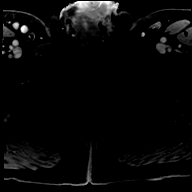

[Series 42: T1 · axial · 3.0mm · 1.15mm/px · 1 of 28 slices shown (31 of 47)]
[im 1/28]
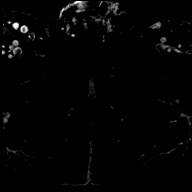

[Series 43: T1 · axial · 3.0mm · 1.15mm/px · 1 of 28 slices shown (32 of 47)]
[im 1/28]
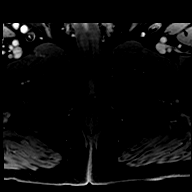

[Series 44: T1 · axial · 3.0mm · 1.15mm/px · 1 of 28 slices shown (33 of 47)]
[im 1/28]
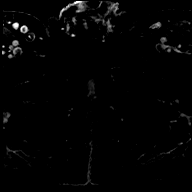

[Series 45: T1 · axial · 3.0mm · 1.15mm/px · 1 of 28 slices shown (34 of 47)]
[im 1/28]
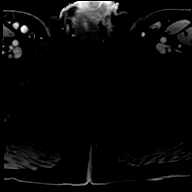

[Series 46: T1 · axial · 3.0mm · 1.15mm/px · 1 of 28 slices shown (35 of 47)]
[im 1/28]
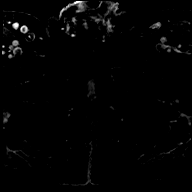

[Series 47: T1 · axial · 3.0mm · 1.15mm/px · 1 of 28 slices shown (36 of 47)]
[im 1/28]
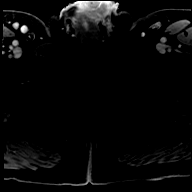

[Series 48: T1 · axial · 3.0mm · 1.15mm/px · 1 of 28 slices shown (37 of 47)]
[im 1/28]
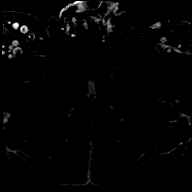

[Series 49: T1 · axial · 3.0mm · 1.15mm/px · 1 of 28 slices shown (38 of 47)]
[im 1/28]
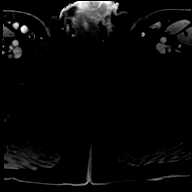

[Series 50: T1 · axial · 3.0mm · 1.15mm/px · 1 of 28 slices shown (39 of 47)]
[im 1/28]
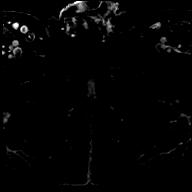

[Series 51: T1 · axial · 3.0mm · 1.15mm/px · 1 of 28 slices shown (40 of 47)]
[im 1/28]
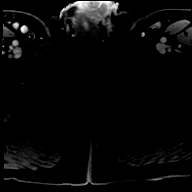

[Series 52: T1 · axial · 3.0mm · 1.15mm/px · 1 of 28 slices shown (41 of 47)]
[im 1/28]
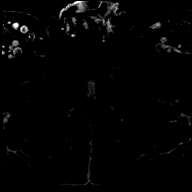

[Series 53: T1 · axial · 3.0mm · 1.15mm/px · 1 of 28 slices shown (42 of 47)]
[im 1/28]
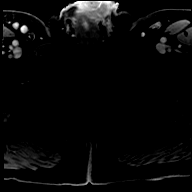

[Series 54: T1 · axial · 3.0mm · 1.15mm/px · 1 of 28 slices shown (43 of 47)]
[im 1/28]
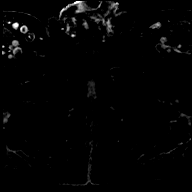

[Series 55: T1 · axial · 3.0mm · 1.15mm/px · 1 of 28 slices shown (44 of 47)]
[im 1/28]
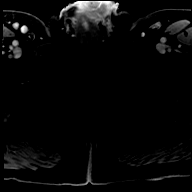

[Series 56: T1 · axial · 3.0mm · 1.15mm/px · 1 of 28 slices shown (45 of 47)]
[im 1/28]
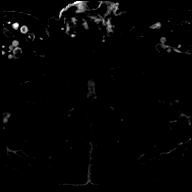

[Series 57: T1 · axial · 3.0mm · 1.15mm/px · 1 of 28 slices shown (46 of 47)]
[im 1/28]
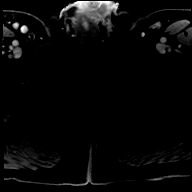

[Series 58: T1 · axial · 3.0mm · 1.15mm/px · 1 of 28 slices shown (47 of 47)]
[im 1/28]
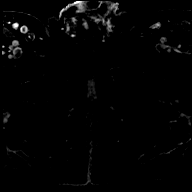

[56 of 56 positions shown; findings below may reference images not displayed]

FINDINGS: Prostate:

Region of interest-check 1: PI-RADS category 4 lesion of the left
anterior peripheral zone in the mid gland, with focally reduced T2
signal and mild focal early enhancement as shown for example on
image 43 series 10 and image 16 series 22. This measures 0.47 cc
(1.2 by 0.5 by 0.8 cm).

Region of interest # 2: PI-RADS category 3 lesion of the left
posterolateral peripheral zone at the base, with reduced T2 signal
but no focal early enhancement or substantial restricted diffusion.
This measures 0.63 Cc (1.6 by 0.9 by 0.8 cm) and is shown for
example on image 27 series 10.

Hazy low T2 signal stranding throughout the peripheral zone, likely
postinflammatory given the nonfocal nature and considered PI-RADS
category 2.

Mild encapsulated nodularity of the transition zone compatible with
benign prostatic hypertrophy.

Focal artifacts in the prostate gland compatible with Urolift
device.

Volume: 3D volumetric assessment: Prostate volume 60.44 cc (5.4 by
4.9 by 5.1 cm).

Transcapsular spread:  Absent

Seminal vesicle involvement: Absent

Neurovascular bundle involvement: Absent

Pelvic adenopathy: Absent

Bone metastasis: Absent

Other findings: Oval-shaped structure with homogeneously low T1
signal noted anteriorly within the right gluteus maximus muscle
measuring 2.6 by 1.8 cm on image 40 of series 4, essentially
unchanged from 07/23/2017. Difficult to assess for enhancement.
There is some fat density along the margins of the lesion.
Possibilities include a cystic lesions such as ganglion cyst, or
schwannoma.
IMPRESSION: 1. PI-RADS category 4 lesion of the left anterior peripheral zone in
the mid gland. PI-RADS category 3 lesion of the left posterolateral
peripheral zone at the base. Targeting data sent to UroNAV.
2. Benign prostatic hypertrophy with mild prostatomegaly.
3. UroLift device noted.
4. Oval-shaped benign-appearing lesion anteriorly in the right
gluteus maximus muscle, no change from the 07/23/2017 CT scan,
probably a schwannoma or small cystic lesions such as ganglion cyst.

## 2022-04-05 ENCOUNTER — Ambulatory Visit: Payer: Medicare HMO | Admitting: Clinical

## 2022-04-10 ENCOUNTER — Ambulatory Visit: Payer: Medicare HMO | Admitting: Clinical

## 2022-04-26 ENCOUNTER — Encounter: Payer: Self-pay | Admitting: Urology

## 2022-04-26 ENCOUNTER — Ambulatory Visit (INDEPENDENT_AMBULATORY_CARE_PROVIDER_SITE_OTHER): Payer: Medicare Other | Admitting: Urology

## 2022-04-26 VITALS — BP 120/80 | HR 81 | Ht 69.0 in | Wt 163.0 lb

## 2022-04-26 DIAGNOSIS — N5201 Erectile dysfunction due to arterial insufficiency: Secondary | ICD-10-CM

## 2022-04-26 DIAGNOSIS — R972 Elevated prostate specific antigen [PSA]: Secondary | ICD-10-CM

## 2022-04-26 DIAGNOSIS — R7989 Other specified abnormal findings of blood chemistry: Secondary | ICD-10-CM

## 2022-04-26 DIAGNOSIS — E291 Testicular hypofunction: Secondary | ICD-10-CM

## 2022-04-26 MED ORDER — SILDENAFIL CITRATE 100 MG PO TABS: ORAL_TABLET | ORAL | 3 refills | Status: DC

## 2022-04-26 MED ORDER — TESTOSTERONE 20.25 MG/ACT (1.62%) TD GEL
TRANSDERMAL | 2 refills | Status: DC
Start: 2022-04-26 — End: 2022-07-25
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
## 2022-05-26 ENCOUNTER — Other Ambulatory Visit: Payer: 59

## 2022-05-30 ENCOUNTER — Other Ambulatory Visit: Payer: 59

## 2022-06-07 ENCOUNTER — Other Ambulatory Visit: Payer: 59

## 2022-06-07 DIAGNOSIS — R7989 Other specified abnormal findings of blood chemistry: Secondary | ICD-10-CM

## 2022-06-07 DIAGNOSIS — R972 Elevated prostate specific antigen [PSA]: Secondary | ICD-10-CM

## 2022-06-08 ENCOUNTER — Encounter: Payer: Self-pay | Admitting: *Deleted

## 2022-06-08 LAB — HEMATOCRIT: Hematocrit: 39.2 % (ref 37.5–51.0)

## 2022-06-08 LAB — PSA: Prostate Specific Ag, Serum: 3 ng/mL (ref 0.0–4.0)

## 2022-06-08 LAB — TESTOSTERONE: Testosterone: 324 ng/dL (ref 264–916)

## 2022-07-01 ENCOUNTER — Other Ambulatory Visit: Payer: Self-pay | Admitting: Urology

## 2022-07-03 NOTE — Addendum Note (Signed)
Addended by: Kris Mouton on: 07/03/2022 09:18 AM   Modules accepted: Orders

## 2022-07-03 NOTE — Telephone Encounter (Signed)
Patient called about this refill. This was denied. Patient states he has been taking Cialis 5 mg 1 daily as advised by Dr Rogers Blocker for a couple years now, and takes Sildenafil as needed when he is actually sexually active with someone. Ok to refill Cialis?

## 2022-07-05 MED ORDER — TADALAFIL 5 MG PO TABS: 5.0000 mg | ORAL_TABLET | Freq: Every day | ORAL | 3 refills | Status: DC

## 2022-07-05 NOTE — Telephone Encounter (Signed)
Patient advised.

## 2022-07-25 ENCOUNTER — Other Ambulatory Visit: Payer: Self-pay | Admitting: *Deleted

## 2022-07-25 MED ORDER — TESTOSTERONE 20.25 MG/ACT (1.62%) TD GEL: TRANSDERMAL | 2 refills | Status: DC

## 2022-09-16 ENCOUNTER — Other Ambulatory Visit: Payer: Self-pay | Admitting: Urology

## 2022-09-19 ENCOUNTER — Other Ambulatory Visit: Payer: Self-pay | Admitting: Family Medicine

## 2022-09-19 NOTE — Telephone Encounter (Signed)
Patient called and states his Testosterone was sent to [facility] and he would prefer for the medication to be sent to [facility]. I took all of the other pharmacies he does not use out of his chart. Please send new RX to [facility].

## 2022-09-20 ENCOUNTER — Other Ambulatory Visit: Payer: Self-pay | Admitting: Urology

## 2022-09-20 ENCOUNTER — Telehealth: Payer: Self-pay

## 2022-09-20 MED ORDER — TESTOSTERONE 20.25 MG/ACT (1.62%) TD GEL: TRANSDERMAL | 2 refills | Status: DC

## 2022-09-20 NOTE — Telephone Encounter (Signed)
Return call to pt @1410  regarding testosterone refill request approved by Michiel Cowboy, PA to [facility] pharmacy.  Pt. Verbalized understanding.

## 2022-09-20 NOTE — Telephone Encounter (Signed)
Patient called back to follow up on request for Testosterone prescription to be sent to [facility]. I let him know that request was sent to Dr. Lonna Cobb on 5/28.

## 2022-09-20 NOTE — Telephone Encounter (Signed)
Pt called LM on triage line requesting refill of testosterone to be sent to [facility] pharmacy instead of [facility].

## 2022-10-29 ENCOUNTER — Other Ambulatory Visit: Payer: Self-pay | Admitting: Urology

## 2022-11-23 ENCOUNTER — Other Ambulatory Visit: Payer: 59

## 2022-12-11 ENCOUNTER — Other Ambulatory Visit: Payer: Self-pay

## 2022-12-11 ENCOUNTER — Other Ambulatory Visit: Payer: 59

## 2022-12-11 DIAGNOSIS — R7989 Other specified abnormal findings of blood chemistry: Secondary | ICD-10-CM

## 2022-12-12 ENCOUNTER — Other Ambulatory Visit: Payer: 59

## 2022-12-12 DIAGNOSIS — R7989 Other specified abnormal findings of blood chemistry: Secondary | ICD-10-CM

## 2022-12-13 ENCOUNTER — Other Ambulatory Visit: Payer: 59

## 2022-12-13 LAB — HEMATOCRIT: Hematocrit: 37.2 % — ABNORMAL LOW (ref 37.5–51.0)

## 2022-12-13 LAB — TESTOSTERONE: Testosterone: 513 ng/dL (ref 264–916)

## 2022-12-21 ENCOUNTER — Encounter: Payer: Self-pay | Admitting: Urology

## 2023-01-02 ENCOUNTER — Telehealth: Payer: Self-pay | Admitting: Urology

## 2023-01-02 DIAGNOSIS — N5201 Erectile dysfunction due to arterial insufficiency: Secondary | ICD-10-CM

## 2023-01-02 NOTE — Telephone Encounter (Signed)
Patient called and stated that he is interested in getting penile implant, and would like a referral.

## 2023-01-04 ENCOUNTER — Encounter: Payer: Self-pay | Admitting: *Deleted

## 2023-01-04 NOTE — Telephone Encounter (Signed)
Sent patient a my chart message.  Called phone no answer.

## 2023-02-04 ENCOUNTER — Other Ambulatory Visit: Payer: Self-pay | Admitting: Urology

## 2023-03-12 ENCOUNTER — Other Ambulatory Visit: Payer: Self-pay | Admitting: Urology

## 2023-03-19 ENCOUNTER — Other Ambulatory Visit: Payer: Self-pay

## 2023-03-19 ENCOUNTER — Encounter: Payer: Self-pay | Admitting: *Deleted

## 2023-03-19 NOTE — Telephone Encounter (Signed)
Left message on voice mail and sent my chart message  .Patient to call us back if needed.

## 2023-03-19 NOTE — Telephone Encounter (Signed)
Pt calls triage line and states that Dr. Evelene Croon was prescribing him Truvada as a prophylactic and questions if you can take over filling this for him. Please advise[facility] [place].

## 2023-04-27 ENCOUNTER — Ambulatory Visit: Payer: 59 | Admitting: Urology

## 2023-05-17 ENCOUNTER — Ambulatory Visit: Payer: 59 | Admitting: Urology

## 2023-06-14 ENCOUNTER — Emergency Department
Admission: EM | Admit: 2023-06-14 | Discharge: 2023-06-14 | Disposition: A | Discharge status: Home / Self Care | Payer: 59 | Attending: Emergency Medicine | Admitting: Emergency Medicine

## 2023-06-14 ENCOUNTER — Other Ambulatory Visit: Payer: Self-pay

## 2023-06-14 DIAGNOSIS — R5383 Other fatigue: Secondary | ICD-10-CM | POA: Insufficient documentation

## 2023-06-14 DIAGNOSIS — R531 Weakness: Secondary | ICD-10-CM | POA: Diagnosis not present

## 2023-06-14 LAB — COMPREHENSIVE METABOLIC PANEL
ALT: 7 U/L (ref 0–44)
AST: 23 U/L (ref 15–41)
Albumin: 3.8 g/dL (ref 3.5–5.0)
Alkaline Phosphatase: 83 U/L (ref 38–126)
Anion gap: 9 (ref 5–15)
BUN: 23 mg/dL (ref 8–23)
CO2: 24 mmol/L (ref 22–32)
Calcium: 9.1 mg/dL (ref 8.9–10.3)
Chloride: 104 mmol/L (ref 98–111)
Creatinine, Ser: 0.96 mg/dL (ref 0.61–1.24)
GFR, Estimated: >60 mL/min (ref >60)
Glucose, Bld: 100 mg/dL — ABNORMAL HIGH (ref 70–99)
Potassium: 4.2 mmol/L (ref 3.5–5.1)
Sodium: 137 mmol/L (ref 135–145)
Total Bilirubin: 0.8 mg/dL (ref 0.0–1.2)
Total Protein: 7.2 g/dL (ref 6.5–8.1)

## 2023-06-14 LAB — URINALYSIS, COMPLETE (UACMP) WITH MICROSCOPIC
Bacteria, UA: NONE SEEN
Bilirubin Urine: NEGATIVE
Glucose, UA: NEGATIVE mg/dL
Hgb urine dipstick: NEGATIVE
Ketones, ur: 5 mg/dL — AB
Leukocytes,Ua: NEGATIVE
Nitrite: NEGATIVE
Protein, ur: NEGATIVE mg/dL
Specific Gravity, Urine: 1.019 (ref 1.005–1.030)
Squamous Epithelial / HPF: 0 /[HPF] (ref 0–5)
pH: 5 (ref 5.0–8.0)

## 2023-06-14 LAB — CBC
HCT: 40.7 % (ref 39.0–52.0)
Hemoglobin: 13.4 g/dL (ref 13.0–17.0)
MCH: 31.8 pg (ref 26.0–34.0)
MCHC: 32.9 g/dL (ref 30.0–36.0)
MCV: 96.7 fL (ref 80.0–100.0)
Platelets: 234 10*3/uL (ref 150–400)
RBC: 4.21 MIL/uL — ABNORMAL LOW (ref 4.22–5.81)
RDW: 14.6 % (ref 11.5–15.5)
WBC: 5.6 10*3/uL (ref 4.0–10.5)
nRBC: 0 % (ref 0.0–0.2)

## 2023-06-14 LAB — RESP PANEL BY RT-PCR (RSV, FLU A&B, COVID)  RVPGX2
Influenza A by PCR: NEGATIVE
Influenza B by PCR: NEGATIVE
Resp Syncytial Virus by PCR: NEGATIVE
SARS Coronavirus 2 by RT PCR: NEGATIVE

## 2023-06-14 MED ORDER — SODIUM CHLORIDE 0.9 % IV BOLUS
1000.0000 mL | Freq: Once | INTRAVENOUS | Status: AC
  Administered 2023-06-14: 1000 mL via INTRAVENOUS

## 2023-06-14 MED ORDER — KETOROLAC TROMETHAMINE 30 MG/ML IJ SOLN
15.0000 mg | Freq: Once | INTRAMUSCULAR | Status: AC
  Administered 2023-06-14: 15 mg via INTRAVENOUS
  Filled 2023-06-14: qty 1

## 2023-06-14 NOTE — ED Triage Notes (Signed)
Pt sts that he was taken off one of his medications for parkinson's as pt sts that he did not have parkinson and was told that he just has a essential tremor. Since then pt sts that he has not been able to walk.

## 2023-06-14 NOTE — ED Provider Notes (Signed)
[name] Provider Note    Event Date/Time   First MD Initiated Contact with Patient 06/14/23 1936     (approximate)  History   Chief Complaint: Loss of Mobility  HPI  Todd Green is a 73 y.o. male with a past medical history of anxiety, gastric reflux, Parkinson's versus central tremor presents to the emergency department for decreased mobility.  According to the patient he has been taking Sinemet for quite some time however he recently saw a new neurologist Dr. Malvin Johns who took the patient off of this medication as he felt that the patient did not have Parkinson's but instead had essential tremor.  He placed the patient on gabapentin.  Patient states this occurred approximately 1.5 weeks ago and he has noted over the past 1.5 weeks of progressive decrease in energy and mobility.  Patient states he has been fatigued and is less mobile than he typically had been.  Patient states he did take one of his Sinemet tablets earlier today but did not notice any change so he came to the emergency department.  Physical Exam   Triage Vital Signs: ED Triage Vitals  Encounter Vitals Group     BP 06/14/23 1857 107/73     Systolic BP Percentile --      Diastolic BP Percentile --      Pulse Rate 06/14/23 1857 100     Resp 06/14/23 1857 17     Temp 06/14/23 1857 98.1 F (36.7 C)     Temp Source 06/14/23 1857 Oral     SpO2 06/14/23 1857 100 %     Weight 06/14/23 1858 156 lb (70.8 kg)     Height --      Head Circumference --      Peak Flow --      Pain Score 06/14/23 1858 0     Pain Loc --      Pain Education --      Exclude from Growth Chart --     Most recent vital signs: Vitals:   06/14/23 1857  BP: 107/73  Pulse: 100  Resp: 17  Temp: 98.1 F (36.7 C)  SpO2: 100%    General: Awake, no distress.  Patient does have upper and lower extremity tremor at times. CV:  Good peripheral perfusion.  Regular rate and rhythm  Resp:  Normal effort.  Equal breath  sounds bilaterally.  Abd:  No distention.  Soft, nontender.  No rebound or guarding.  ED Results / Procedures / Treatments   MEDICATIONS ORDERED IN ED: Medications  sodium chloride 0.9 % bolus 1,000 mL (1,000 mLs Intravenous New Bag/Given 06/14/23 2013)  ketorolac (TORADOL) 30 MG/ML injection 15 mg (15 mg Intravenous Given 06/14/23 2014)     IMPRESSION / MDM / ASSESSMENT AND PLAN / ED COURSE  I reviewed the triage vital signs and the nursing notes.  Patient's presentation is most consistent with acute presentation with potential threat to life or bodily function.  Patient presents emergency department for worsening fatigue decreased mobility and generalized weakness over the past 1.5 weeks.  Patient states he stopped sentiment 1.5 weeks ago started gabapentin.  The combination of stopping Sinemet as well as adding gabapentin could be the cause for the patient's symptoms.  However given the weakness we will work the patient up with lab work blood work, urinalysis.  Will obtain a COVID/flu swab as a precaution to rule out any respiratory infection that could be affecting the patient.  We will IV  hydrate continue to closely monitor while awaiting results.  Patient agreeable plan of care.  Patient's workup is overall reassuring, CBC shows no concerning findings, chemistry is reassuring.  Urinalysis is normal/negative and COVID/respiratory panel is negative.  Given the patient's reassuring workup I discussed with the patient to follow-up with his neurologist to discuss restarting the medication if deemed needed.  Otherwise discussed supportive care with plenty of rest fluids and following up with his primary care doctor tomorrow.  FINAL CLINICAL IMPRESSION(S) / ED DIAGNOSES   Weakness Fatigue   Note:  This document was prepared using Dragon voice recognition software and may include unintentional dictation errors.   Minna Antis, MD 06/14/23 2112

## 2023-06-14 NOTE — Discharge Instructions (Signed)
Please follow-up with your doctor as well as neurologist within the next several days to discuss your symptoms and ongoing management including medication regiment.  Drink plenty of fluids and obtain plenty of rest.  Return to the emergency department for any symptom personally concerning to yourself.

## 2023-06-25 ENCOUNTER — Encounter: Payer: Self-pay | Admitting: Urology

## 2023-06-25 ENCOUNTER — Ambulatory Visit (INDEPENDENT_AMBULATORY_CARE_PROVIDER_SITE_OTHER): Payer: 59 | Admitting: Urology

## 2023-06-25 VITALS — BP 131/80 | HR 92 | Ht 69.0 in | Wt <= 1120 oz

## 2023-06-25 DIAGNOSIS — Z125 Encounter for screening for malignant neoplasm of prostate: Secondary | ICD-10-CM

## 2023-06-25 DIAGNOSIS — N5201 Erectile dysfunction due to arterial insufficiency: Secondary | ICD-10-CM

## 2023-06-25 DIAGNOSIS — Z87898 Personal history of other specified conditions: Secondary | ICD-10-CM

## 2023-06-25 DIAGNOSIS — N401 Enlarged prostate with lower urinary tract symptoms: Secondary | ICD-10-CM | POA: Diagnosis not present

## 2023-06-25 DIAGNOSIS — E291 Testicular hypofunction: Secondary | ICD-10-CM

## 2023-06-25 DIAGNOSIS — R7989 Other specified abnormal findings of blood chemistry: Secondary | ICD-10-CM
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
## 2023-06-26 ENCOUNTER — Encounter: Payer: Self-pay | Admitting: Urology

## 2023-06-26 LAB — TESTOSTERONE: Testosterone: 545 ng/dL (ref 264–916)

## 2023-06-26 LAB — PSA: Prostate Specific Ag, Serum: 3.1 ng/mL (ref 0.0–4.0)

## 2023-08-29 ENCOUNTER — Telehealth: Payer: Self-pay | Admitting: Urology

## 2023-08-29 ENCOUNTER — Encounter: Payer: Self-pay | Admitting: Urology

## 2023-08-29 ENCOUNTER — Other Ambulatory Visit: Payer: Self-pay | Admitting: Urology

## 2023-08-29 NOTE — Telephone Encounter (Signed)
 Patient called and left message that he needs [facility] [address] [facility] [place] for Testosterone  Gel 1.62%. Patient states that he called twice last week and had not yet received a response. Patient states that he is nearly out and would like for this to be sent asap. Patient stated that the pharmacy has contacted our office as well. Please advice patient.

## 2023-08-29 NOTE — Telephone Encounter (Signed)
 Error

## 2023-08-30 MED ORDER — TESTOSTERONE 1.62 % TD GEL: TRANSDERMAL | 2 refills | Status: DC

## 2023-08-30 NOTE — Telephone Encounter (Signed)
 Patient called to check status of testosterone  refill request. I told him that it was sent to Dr. Cherylene Corrente to sign off on. He is requesting that we send a mychart message to him when this is done.

## 2023-10-09 ENCOUNTER — Ambulatory Visit (INDEPENDENT_AMBULATORY_CARE_PROVIDER_SITE_OTHER): Admitting: Physician Assistant

## 2023-10-09 VITALS — BP 138/70 | HR 74 | Ht 69.0 in | Wt 156.0 lb

## 2023-10-09 DIAGNOSIS — R350 Frequency of micturition: Secondary | ICD-10-CM

## 2023-10-09 DIAGNOSIS — N401 Enlarged prostate with lower urinary tract symptoms: Secondary | ICD-10-CM | POA: Diagnosis not present

## 2023-10-09 LAB — URINALYSIS, COMPLETE
Bilirubin, UA: NEGATIVE
Glucose, UA: NEGATIVE
Ketones, UA: NEGATIVE
Leukocytes,UA: NEGATIVE
Nitrite, UA: NEGATIVE
RBC, UA: NEGATIVE
Specific Gravity, UA: 1.025 (ref 1.005–1.030)
Urobilinogen, Ur: 0.2 mg/dL (ref 0.2–1.0)
pH, UA: 6 (ref 5.0–7.5)

## 2023-10-09 LAB — MICROSCOPIC EXAMINATION

## 2023-10-09 LAB — BLADDER SCAN AMB NON-IMAGING: Scan Result: 120
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
## 2023-11-08 ENCOUNTER — Emergency Department
Admission: EM | Admit: 2023-11-08 | Discharge: 2023-11-08 | Disposition: A | Discharge status: Home / Self Care | Attending: Emergency Medicine | Admitting: Emergency Medicine

## 2023-11-08 ENCOUNTER — Other Ambulatory Visit: Payer: Self-pay

## 2023-11-08 ENCOUNTER — Encounter: Payer: Self-pay | Admitting: Emergency Medicine

## 2023-11-08 DIAGNOSIS — G259 Extrapyramidal and movement disorder, unspecified: Secondary | ICD-10-CM | POA: Diagnosis present

## 2023-11-08 DIAGNOSIS — Z760 Encounter for issue of repeat prescription: Secondary | ICD-10-CM | POA: Insufficient documentation

## 2023-11-08 NOTE — Discharge Instructions (Addendum)
 Contact Dr. Clement office for follow-up and to help guide the plan for changing her medication regimen.  You may restart the Sinemet  (carbidopa -levodopa ) 25-100mg  tablets with 1 tablet 3 times daily.    Our neurologist has recommended that you taper off the primidone by 50 mg about every 3 days.  Therefore, tonight you may take a 50 mg dose.  Tomorrow and Saturday you should also take 100 mg in the morning, 100 mg at midday, and 50 mg in the evening.  From Sunday through Tuesday, take 100 mg in the morning, go down to 50 mg at midday, and 50 mg at night.  From next Wednesday through Friday, go down to 50 mg 3 times daily.  From there, you should have Dr. Clement office guide you on how to further taper off of the medication.  Return to the ER for any new or worsening tremor, shaking, weakness or numbness, seizures, or any other new or worsening symptoms that concern you.

## 2023-11-08 NOTE — ED Triage Notes (Signed)
 Patient to ED via POV for medication change. Pt reports he was recently taken off his carbidopa -levodopa  and changed to Primidone for his tremors 3 weeks ago. PT reports he does not like the new medication and wants to be placed back on his carbidopa -levodopa . Denies any other complaints at this time. States he has not spoken to his doctor about wanting to change back at this time.

## 2023-11-08 NOTE — ED Provider Notes (Signed)
 [facility] Provider Note    Event Date/Time   First MD Initiated Contact with Patient 11/08/23 1451     (approximate)   History   Med Change Request   HPI  Todd Green is a 73 y.o. male with a history of essential tremor and possible Parkinsonism disease who presents with request to change his medication.  The patient states that he was taken off of carbidopa -levodopa  a few months ago and has been on primidone which has been gradually increased.  Most recently he was put on 100 mg 3 times daily earlier this week.  The patient states that his tremors are getting worse.  Today he had significant tremor that he had difficulty getting out of his car.  He denies any weakness or numbness or other acute symptoms.  He states that he felt better on his previous regimen and is requesting to be represcribed the carbidopa -levodopa  and to be taken off the primidone.  I reviewed the past medical records.  The patient was seen by Dr. Lane from neurology on 7/14.  At that time he was recommended to start Wellbutrin  for anxiety, increase primidone to 100 mg 3 times daily, and to stop his Sinemet  because there was no improvement in his symptoms.   Physical Exam   Triage Vital Signs: ED Triage Vitals  Encounter Vitals Group     BP 11/08/23 1344 100/71     Girls Systolic BP Percentile --      Girls Diastolic BP Percentile --      Boys Systolic BP Percentile --      Boys Diastolic BP Percentile --      Pulse Rate 11/08/23 1344 78     Resp 11/08/23 1344 18     Temp 11/08/23 1344 98.3 F (36.8 C)     Temp Source 11/08/23 1344 Oral     SpO2 11/08/23 1344 97 %     Weight 11/08/23 1354 157 lb (71.2 kg)     Height 11/08/23 1354 5' 9 (1.753 m)     Head Circumference --      Peak Flow --      Pain Score 11/08/23 1354 0     Pain Loc --      Pain Education --      Exclude from Growth Chart --     Most recent vital signs: Vitals:   11/08/23 1344  BP: 100/71  Pulse:  78  Resp: 18  Temp: 98.3 F (36.8 C)  SpO2: 97%    General: Awake, no distress.  CV:  Good peripheral perfusion.  Resp:  Normal effort.  Abd:  No distention.  Other:  Mild tremor.  Motor intact in all extremities.  Normal speech.   ED Results / Procedures / Treatments   Labs (all labs ordered are listed, but only abnormal results are displayed) Labs Reviewed - No data to display   EKG   RADIOLOGY   PROCEDURES:  Critical Care performed: No  Procedures   MEDICATIONS ORDERED IN ED: Medications - No data to display   IMPRESSION / MDM / ASSESSMENT AND PLAN / ED COURSE  I reviewed the triage vital signs and the nursing notes.  73 year old male with PMH as noted above presents with worsening symptoms of his chronic tremor condition and requesting to be put back on his prior regimen of carbidopa -levodopa .  Physical exam is unremarkable for acute findings.  I will consult neurology to determine the best way to wean the patient  off of the primidone.  Differential diagnosis includes, but is not limited to, chronic tremor syndrome.  Patient's presentation is most consistent with exacerbation of chronic illness.  ----------------------------------------- 4:58 PM on 11/08/2023 -----------------------------------------  I consulted and discussed the case with Dr. Michaela from neurology who recommends the patient taper the primidone by 50 mg every 3 days, starting with 100-100-50 for 3 days, then 100-50-50 for 3 days, then 50-50-50 for the following 3 days.  He recommends that the patient contact Dr. Clement office for further guidance on tapering after this.  I counseled the patient on the neurology recommendations and plan of care.  I emphasized the need for close follow-up.  He has the Sinemet  as well as the 50 mg tablets of primidone at home and does not need any prescriptions.  I instructed him to taper the primidone as above and have provided this in writing as well.   I also instructed him to restart the Sinemet  at 1 tablet 3 times daily.  I gave strict return precautions, the patient expressed understanding.  He is stable for discharge at this time.   FINAL CLINICAL IMPRESSION(S) / ED DIAGNOSES   Final diagnoses:  Movement disorder     Rx / DC Orders   ED Discharge Orders     None        Note:  This document was prepared using Dragon voice recognition software and may include unintentional dictation errors.    Jacolyn Pae, MD 11/08/23 1659

## 2023-11-08 NOTE — ED Notes (Signed)
 See triage note.  Presents requesting a medication change  Denies any other sxs'

## 2023-11-12 ENCOUNTER — Ambulatory Visit: Admitting: Urology

## 2023-11-12 VITALS — BP 116/71 | HR 80 | Ht 69.0 in | Wt 157.0 lb

## 2023-11-12 DIAGNOSIS — N401 Enlarged prostate with lower urinary tract symptoms: Secondary | ICD-10-CM

## 2023-11-12 DIAGNOSIS — Z87898 Personal history of other specified conditions: Secondary | ICD-10-CM

## 2023-11-12 LAB — URINALYSIS, COMPLETE
Bilirubin, UA: NEGATIVE
Glucose, UA: NEGATIVE
Leukocytes,UA: NEGATIVE
Nitrite, UA: NEGATIVE
Protein,UA: NEGATIVE
RBC, UA: NEGATIVE
Specific Gravity, UA: 1.02 (ref 1.005–1.030)
Urobilinogen, Ur: 0.2 mg/dL (ref 0.2–1.0)
pH, UA: 6 (ref 5.0–7.5)

## 2023-11-12 LAB — MICROSCOPIC EXAMINATION
Epithelial Cells (non renal): NONE SEEN /HPF (ref 0–10)
Mucus, UA: NONE SEEN
RBC, Urine: NONE SEEN /HPF (ref 0–2)

## 2023-11-12 NOTE — Progress Notes (Signed)
   11/12/23  CC:  Chief Complaint  Patient presents with   Cysto    HPI: Refer to Sam Vaillancourt's prior note 10/09/2023.  Prostate MRI volume in 2022 was 60 cc  Blood pressure 116/71, pulse 80, height 5' 9 (1.753 m), weight 157 lb (71.2 kg).  Cystoscopy Procedure Note  Patient identification was confirmed, informed consent was obtained, and patient was prepped using Betadine solution.  Lidocaine  jelly was administered per urethral meatus.     Pre-Procedure: - Inspection reveals a normal caliber urethral meatus.  Procedure: The flexible cystoscope was introduced without difficulty - No urethral strictures/lesions are present. - Coapting lateral lobes prostate  - Elevated bladder neck - Bilateral ureteral orifices identified - Bladder mucosa  reveals no ulcers, tumors, or lesions - No bladder stones -Moderate trabeculation; left posterior wall diverticulum  Retroflexion shows no intravesical median lobe   Post-Procedure: - Patient tolerated the procedure well  Assessment/ Plan: Loss of anterior channel s/p UroLift Coapting lateral lobes He is interested in a repeat outlet procedure Appointment with Dr. Francisca to discuss HoLEP   Glendia JAYSON Barba, MD

## 2023-11-18 ENCOUNTER — Encounter: Payer: Self-pay | Admitting: Urology

## 2023-11-21 ENCOUNTER — Ambulatory Visit: Admitting: Neurology

## 2023-11-22 ENCOUNTER — Telehealth: Payer: Self-pay

## 2023-11-22 NOTE — Telephone Encounter (Signed)
 Pt called in to let us  know that he is urinating more. Pt states a 10lb weight loss from urinating in the last week. Patient ws seen by his primary care yesterday and states she has a lab in for him to have his A1C drawn soon. He states no symptoms other than urinating more. He feels fine. I told him to keep his lab appointment coming up and F/U with his primary sooner if needed.

## 2023-12-01 ENCOUNTER — Other Ambulatory Visit: Payer: Self-pay | Admitting: Urology

## 2023-12-04 ENCOUNTER — Telehealth: Payer: Self-pay

## 2023-12-04 NOTE — Telephone Encounter (Signed)
 Patient called complaining of burning with urination and urgency but no fever. He was offered a same day appointment but declined. Patient stated he was currently on amoxicillin from his primary for a UTI. I explained that it could take 2-3 days to feel some relief after starting antibiotics and that he needed to finish the course he was on. I let him know if his symptoms got worse he could give a call back or contact his primary care or seek medical attention at urgent care.

## 2023-12-12 ENCOUNTER — Ambulatory Visit (INDEPENDENT_AMBULATORY_CARE_PROVIDER_SITE_OTHER): Admitting: Urology

## 2023-12-12 VITALS — BP 118/75 | HR 85 | Ht 69.0 in | Wt 150.0 lb

## 2023-12-12 DIAGNOSIS — R399 Unspecified symptoms and signs involving the genitourinary system: Secondary | ICD-10-CM

## 2023-12-12 DIAGNOSIS — Z125 Encounter for screening for malignant neoplasm of prostate: Secondary | ICD-10-CM | POA: Diagnosis not present

## 2023-12-12 NOTE — Progress Notes (Signed)
   12/12/2023 11:24 AM   Todd Green [birth year] [id number]  Reason for visit: Follow up urinary symptoms, PSA screening, testosterone  replacement, PSA screening  HPI: 73 year old male with a number of medical issues including possible Parkinson's disease, hypogonadism on testosterone  replacement, ED who presents to discuss BPH, urinary symptoms, and HOLEP procedure.  He previously was followed by Dr. Gala and Dr. Twylla and I reviewed those notes at length.  He underwent a UroLift with Dr. Gala in 2022, he thinks this only mildly improved his symptoms.  He also had an abnormal prostate MRI in 2022 with a PI-RADS 4 lesion, 60 g prostate, MRI fusion biopsy with Dr. Gala was completely benign.  Most recent PSA from March 2025 is stable at 3.1 from 2.65 years ago.  He has been on testosterone  gel for hypogonadism.  Recently underwent cystoscopy with Dr. Twylla for further evaluation of urinary symptoms, primarily urinary frequency and urgency.  PVRs have been normal.  Cystoscopy showed moderate size prostate with lateral lobe hypertrophy but no other worrisome findings.  He reportedly had a UTI after cystoscopy treated with antibiotics by PCP.  His primary complaint today continues to be urinary frequency.  He denies any urinary problems overnight.  He does not have any problems with strength of his stream.  He has a possible diagnosis of Parkinson's from neurology.  He drinks only diet soda and coffee during the day.  I strongly recommended starting with behavioral strategies of avoiding bladder irritants and timed voiding prior to considering HOLEP with his normal emptying and primary symptoms of frequency during the day associated with bladder irritants.  Risks and benefits of surgery were discussed extensively.  He is resistant to considering medication options for his urinary symptoms.  We discussed the risks and benefits of HoLEP at length.  The procedure requires general anesthesia and takes  1 to 2 hours, and a holmium laser is used to enucleate the prostate and push this tissue into the bladder.  A morcellator is then used to remove this tissue, which is sent for pathology.  The vast majority(>95%) of patients are able to discharge the same day with a catheter in place for 2 to 3 days, and will follow-up in clinic for a voiding trial.  We specifically discussed the risks of bleeding, infection, retrograde ejaculation, temporary urgency and urge incontinence, very low risk of long-term incontinence, urethral stricture/bladder neck contracture, pathologic evaluation of prostate tissue and possible detection of prostate cancer or other malignancy, and possible need for additional procedures.  -Avoid bladder irritants, RTC 2 to 3 months symptom check and PVR -Would still strongly recommend trial of medication before HOLEP, as he had minimal improvement with UroLift procedure previously  Redell JAYSON Burnet, MD  [facility] [address] [place], [place] [address] [phone]

## 2023-12-12 NOTE — Patient Instructions (Addendum)
 Your symptoms could be related to your high intake of soda, diet soda, caffeine, and coffee.  I would recommend drinking mostly water during the day and see how your urinary symptoms respond, some patients have complete resolution of their urinary problems by avoiding these bladder irritants.  If you have no improvement in your urinary symptoms with the above strategies, we can certainly consider surgery in the future, however we want to be sure this will improve your symptoms before moving forward with surgery that can have risks like bleeding, infection, and leakage.      Holmium Laser Enucleation of the Prostate (HoLEP)  HoLEP is a treatment for men with benign prostatic hyperplasia (BPH). The laser surgery removed blockages of urine flow, and is done without any incisions on the body.     What is HoLEP?  HoLEP is a type of laser surgery used to treat obstruction (blockage) of urine flow as a result of benign prostatic hyperplasia (BPH). In men with BPH, the prostate gland is not cancerous, but has become enlarged. An enlarged prostate can result in a number of urinary tract symptoms such as weak urinary stream, difficulty in starting urination, inability to urinate, frequent urination, or getting up at night to urinate.  HoLEP was developed in the 1990's as a more effective and less expensive surgical option for BPH, compared to other surgical options such as laser vaporization(PVP/greenlight laser), transurethral resection of the prostate(TURP), and open simple prostatectomy.   What happens during a HoLEP?  HoLEP requires general anesthesia ("asleep" throughout the procedure).   An antibiotic is given to reduce the risk of infection  A surgical instrument called a resectoscope is inserted through the urethra (the tube that carries urine from the bladder). The resectoscope has a camera that allows the surgeon to view the internal structure of the prostate gland, and to see where the  incisions are being made during surgery.  The laser is inserted into the resectoscope and is used to enucleate (free up) the enlarged prostate tissue from the capsule (outer shell) and then to seal up any blood vessels. The tissue that has been removed is pushed back into the bladder.  A morcellator is placed through the resectoscope, and is used to suction out the prostate tissue that has been pushed into the bladder.  When the prostate tissue has been removed, the resectoscope is removed, and a foley catheter is placed to allow healing and drain the urine from the bladder.     What happens after a HoLEP?  More than 95% of patients go home the same day a few hours after surgery. Less than 5% will be admitted to the hospital overnight for observation to monitor the urine, or if they have other medical problems.  Fluid is flushed through the catheter for about 1 hour after surgery to clear any blood from the urine. It is normal to have some blood in the urine after surgery. The need for blood transfusion is extremely rare.  Eating and drinking are permitted after the procedure once the patient has fully awakened from anesthesia.  The catheter is usually removed 2-3 days after surgery- the patient will come to clinic to have the catheter removed and make sure they can urinate on their own.  It is very important to drink lots of fluids after surgery for one week to keep the bladder flushed.  At first, there may be some burning with urination, but this typically improved within a few hours to days. Most  patients do not have a significant amount of pain, and narcotic pain medications are rarely needed.  Symptoms of urinary frequency, urgency, and even leakage are NORMAL for the first few weeks after surgery as the bladder adjusts after having to work hard against blockage from the prostate for many years. This will improve, but can sometimes take several months.  The use of pelvic floor  exercises (Kegel exercises) can help improve problems with urinary incontinence.   After catheter removal, patients will be seen at 12 weeks and 6 months for symptom check  No heavy lifting for at least 2-3 weeks after surgery, however patients can walk and do light activities the first day after surgery. Return to work time depends on occupation.    What are the advantages of HoLEP?  HoLEP has been studied in many different parts of the world and has been shown to be a safe and effective procedure. Although there are many types of BPH surgeries available, HoLEP offers a unique advantage in being able to remove a large amount of tissue without any incisions on the body, even in very large prostates, while decreasing the risk of bleeding and providing tissue for pathology (to look for cancer). This decreases the need for blood transfusions during surgery, minimizes hospital stay, and reduces the risk of needing repeat treatment.  What are the side effects of HoLEP?  Temporary burning and bleeding during urination. Some blood may be seen in the urine for weeks after surgery and is part of the healing process.  Urinary incontinence (inability to control urine flow) is expected in all patients immediately after surgery and they should wear pads for the first few days/weeks. This typically improves over the course of several weeks. Performing Kegel exercises can help decrease leakage from stress maneuvers such as coughing, sneezing, or lifting. The rate of long term leakage is very low, 1-2%. Patients may also have leakage with urgency and this may be treated with medication. The risk of urge incontinence can be dependent on several factors including age, prostate size, symptoms, and other medical problems.  Retrograde ejaculation or "backwards ejaculation." In 80% of cases, the patient will not see any fluid during ejaculation after surgery.  Erectile function is generally not significantly affected.    What are the risks of HoLEP?  Injury to the urethra or development of scar tissue at a later date  Injury to the capsule of the prostate (typically treated with longer catheterization).  Injury to the bladder or ureteral orifices (where the urine from the kidney drains out)  Infection of the bladder, testes, or kidneys (~4%)  Return of urinary obstruction at a later date requiring another operation (<2%)  Need for blood transfusion or re-operation due to bleeding  Failure to relieve all symptoms and/or need for prolonged catheterization after surgery  5-15% of patients are found to have previously undiagnosed prostate cancer in their specimen. Prostate cancer can be treated after HoLEP.  Standard risks of anesthesia including blood clots, heart attacks, etc ~1-2% risk of long term urinary incontinence (leakage)  When should I call my doctor?  Fever over 101.3 degrees  Inability to urinate, or large blood clots in the urine

## 2023-12-17 ENCOUNTER — Other Ambulatory Visit: Payer: Self-pay | Admitting: Neurology

## 2023-12-17 DIAGNOSIS — R251 Tremor, unspecified: Secondary | ICD-10-CM

## 2023-12-17 DIAGNOSIS — R55 Syncope and collapse: Secondary | ICD-10-CM

## 2023-12-17 DIAGNOSIS — R29898 Other symptoms and signs involving the musculoskeletal system: Secondary | ICD-10-CM

## 2023-12-17 DIAGNOSIS — R4182 Altered mental status, unspecified: Secondary | ICD-10-CM

## 2023-12-26 ENCOUNTER — Other Ambulatory Visit

## 2023-12-27 ENCOUNTER — Inpatient Hospital Stay
Admission: RE | Admit: 2023-12-27 | Discharge: 2023-12-27 | Disposition: A | Discharge status: Home / Self Care | Source: Ambulatory Visit | Attending: Neurology | Admitting: Neurology

## 2023-12-27 DIAGNOSIS — R4182 Altered mental status, unspecified: Secondary | ICD-10-CM

## 2023-12-27 DIAGNOSIS — R29898 Other symptoms and signs involving the musculoskeletal system: Secondary | ICD-10-CM

## 2023-12-27 DIAGNOSIS — R251 Tremor, unspecified: Secondary | ICD-10-CM

## 2023-12-27 DIAGNOSIS — R55 Syncope and collapse: Secondary | ICD-10-CM

## 2024-01-10 ENCOUNTER — Other Ambulatory Visit: Payer: Self-pay | Admitting: Urology

## 2024-02-14 ENCOUNTER — Encounter: Payer: Self-pay | Admitting: Gastroenterology

## 2024-02-22 ENCOUNTER — Other Ambulatory Visit

## 2024-02-25 ENCOUNTER — Ambulatory Visit: Admission: RE | Admit: 2024-02-25 | Source: Home / Self Care | Admitting: Gastroenterology

## 2024-02-25 SURGERY — COLONOSCOPY
Anesthesia: General

## 2024-02-26 ENCOUNTER — Encounter: Payer: Self-pay | Admitting: Urology

## 2024-02-28 ENCOUNTER — Other Ambulatory Visit: Payer: Self-pay | Admitting: Urology

## 2024-02-28 ENCOUNTER — Telehealth: Payer: Self-pay | Admitting: Urology

## 2024-02-28 DIAGNOSIS — N5201 Erectile dysfunction due to arterial insufficiency: Secondary | ICD-10-CM

## 2024-02-28 MED ORDER — TADALAFIL 5 MG PO TABS: 5.0000 mg | ORAL_TABLET | Freq: Every day | ORAL | 0 refills | Status: DC

## 2024-02-28 NOTE — Telephone Encounter (Signed)
 Patient presented to the front desk demanding a refill on his tadalafil  5 mg daily.  His previous prescription was sent to [facility], so I sent the refill to that pharmacy.  He then told the front desk he wanted it sent to [facility].  When we called [facility] to cancel the prescription, so I could send it to [facility], he had already picked up the refill I had sent.

## 2024-03-12 ENCOUNTER — Ambulatory Visit (INDEPENDENT_AMBULATORY_CARE_PROVIDER_SITE_OTHER): Admitting: Urology

## 2024-03-12 VITALS — BP 142/75 | HR 74 | Wt 151.0 lb

## 2024-03-12 DIAGNOSIS — Z125 Encounter for screening for malignant neoplasm of prostate: Secondary | ICD-10-CM | POA: Diagnosis not present

## 2024-03-12 DIAGNOSIS — N5201 Erectile dysfunction due to arterial insufficiency: Secondary | ICD-10-CM

## 2024-03-12 DIAGNOSIS — R399 Unspecified symptoms and signs involving the genitourinary system: Secondary | ICD-10-CM | POA: Diagnosis not present

## 2024-03-12 LAB — BLADDER SCAN AMB NON-IMAGING

## 2024-03-12 MED ORDER — TADALAFIL 10 MG PO TABS: 10.0000 mg | ORAL_TABLET | Freq: Every day | ORAL | 4 refills | Status: AC

## 2024-03-12 NOTE — Progress Notes (Signed)
   03/12/2024 10:20 AM   Todd Green July 15, 1950 [id number]  Reason for visit: Follow up BPH, urinary symptoms, ED, hypogonadism, PSA screening  History: Previously followed by Dr. Gala, Dr. Twylla, and Sheppard Hones, [place] Cystoscopy by Dr. Twylla July 2025 with obstructive lateral lobes, no other abnormalities History of elevated PSA and abnormal prostate MRI, MRI fusion biopsy was negative with Dr. Gala and PSA is normalized Referred to me August 2025 to consider HOLEP History of UroLift by Dr. Gala Primary urinary complaints were frequency and was consuming high volume of diet sodas and caffeine, I recommended starting with behavioral strategies prior to considering repeat surgery Hypogonadism on gel  Physical Exam: BP (!) 142/75 (BP Location: Left Arm, Patient Position: Sitting, Cuff Size: Normal)   Pulse 74   Wt 151 lb (68.5 kg)   SpO2 100%   BMI 22.30 kg/m   Imaging/labs: PSA March 2025 normal at 3.1 Prostate MRI January 12 2259 g prostate, PI-RADS 4 lesion(biopsy was negative)  Today: Urinary symptoms improved significantly by decreasing diet sodas and bladder irritants, IPSS score today 3, with quality-of-life mostly satisfied and PVR normal at 15ml.  He denies taking any bladder or prostate medicines at this time Hypogonadism symptoms resolved on testosterone  gel, he would like to continue that medication Reports some worsening problems with ED despite 5 mg Cialis  daily, he would like to increase the dose  Plan:   BPH/LUTS: Primarily related to his high intake of bladder irritants, symptoms have completely resolved by diet changes, continue yearly PVR monitoring PSA screening: Past the age of routine PSA screening, negative MRI fusion biopsy, PSA normalized since that time Hypogonadism: Continue topical gel ED: Recommended increasing the Cialis  dose, he prefers 10 mg daily instead of increasing the as needed dose RTC 1 year symptom check for above issues,  testosterone , PVR   Redell JAYSON Burnet, MD  [facility] [address] [place], [place] [address] [phone]

## 2024-04-02 ENCOUNTER — Other Ambulatory Visit: Payer: Self-pay | Admitting: Urology

## 2024-05-02 ENCOUNTER — Encounter: Admission: RE | Payer: Self-pay | Source: Home / Self Care

## 2024-05-02 ENCOUNTER — Ambulatory Visit: Admission: RE | Admit: 2024-05-02 | Source: Home / Self Care | Admitting: Gastroenterology

## 2024-05-02 SURGERY — COLONOSCOPY
Anesthesia: General

## 2025-03-12 ENCOUNTER — Ambulatory Visit: Admitting: Urology
